# Patient Record
Sex: Male | Born: 1950 | Race: White | Hispanic: No | Marital: Single | Smoking: Never smoker
Health system: Southern US, Community
[De-identification: ages and names within clinical notes are randomized; demographics above are authoritative.]

## PROBLEM LIST (undated history)

## (undated) DIAGNOSIS — C679 Malignant neoplasm of bladder, unspecified: Secondary | ICD-10-CM

## (undated) DIAGNOSIS — F32A Depression, unspecified: Secondary | ICD-10-CM

## (undated) DIAGNOSIS — E7800 Pure hypercholesterolemia, unspecified: Secondary | ICD-10-CM

## (undated) DIAGNOSIS — M1990 Unspecified osteoarthritis, unspecified site: Secondary | ICD-10-CM

## (undated) DIAGNOSIS — C7910 Secondary malignant neoplasm of unspecified urinary organs: Principal | ICD-10-CM

## (undated) DIAGNOSIS — F329 Major depressive disorder, single episode, unspecified: Secondary | ICD-10-CM

## (undated) HISTORY — PX: TONSILLECTOMY: SUR1361

## (undated) HISTORY — DX: Secondary malignant neoplasm of unspecified urinary organs: C79.10

## (undated) HISTORY — DX: Malignant neoplasm of bladder, unspecified: C67.9

## (undated) HISTORY — PX: KNEE SURGERY: SHX244

---

## 2003-11-24 ENCOUNTER — Emergency Department (HOSPITAL_COMMUNITY): Admission: EM | Admit: 2003-11-24 | Discharge: 2003-11-24 | Payer: Self-pay | Admitting: Emergency Medicine

## 2006-04-18 ENCOUNTER — Ambulatory Visit (HOSPITAL_COMMUNITY): Admission: RE | Admit: 2006-04-18 | Discharge: 2006-04-18 | Payer: Self-pay | Admitting: Orthopaedic Surgery

## 2006-04-18 ENCOUNTER — Emergency Department (HOSPITAL_COMMUNITY): Admission: EM | Admit: 2006-04-18 | Discharge: 2006-04-18 | Payer: Self-pay | Admitting: Emergency Medicine

## 2006-04-21 ENCOUNTER — Ambulatory Visit (HOSPITAL_COMMUNITY): Admission: RE | Admit: 2006-04-21 | Discharge: 2006-04-21 | Payer: Self-pay | Admitting: Orthopaedic Surgery

## 2012-04-14 ENCOUNTER — Other Ambulatory Visit (HOSPITAL_COMMUNITY): Payer: Self-pay | Admitting: *Deleted

## 2012-04-14 ENCOUNTER — Other Ambulatory Visit (HOSPITAL_COMMUNITY): Payer: Self-pay | Admitting: General Surgery

## 2012-04-14 ENCOUNTER — Ambulatory Visit (HOSPITAL_COMMUNITY)
Admission: RE | Admit: 2012-04-14 | Discharge: 2012-04-14 | Disposition: A | Discharge status: Home / Self Care | Payer: Disability Insurance | Source: Ambulatory Visit | Attending: General Surgery | Admitting: General Surgery

## 2012-04-14 DIAGNOSIS — IMO0002 Reserved for concepts with insufficient information to code with codable children: Secondary | ICD-10-CM | POA: Insufficient documentation

## 2012-04-14 DIAGNOSIS — M25569 Pain in unspecified knee: Secondary | ICD-10-CM

## 2012-04-14 DIAGNOSIS — M25469 Effusion, unspecified knee: Secondary | ICD-10-CM | POA: Insufficient documentation

## 2012-04-14 DIAGNOSIS — M1710 Unilateral primary osteoarthritis, unspecified knee: Secondary | ICD-10-CM | POA: Insufficient documentation

## 2014-03-07 ENCOUNTER — Emergency Department (HOSPITAL_COMMUNITY)
Admission: EM | Admit: 2014-03-07 | Discharge: 2014-03-07 | Disposition: A | Discharge status: Home / Self Care | Payer: Disability Insurance | Attending: Emergency Medicine | Admitting: Emergency Medicine

## 2014-03-07 ENCOUNTER — Encounter (HOSPITAL_COMMUNITY): Payer: Self-pay | Admitting: Emergency Medicine

## 2014-03-07 DIAGNOSIS — Z8639 Personal history of other endocrine, nutritional and metabolic disease: Secondary | ICD-10-CM | POA: Insufficient documentation

## 2014-03-07 DIAGNOSIS — S61209A Unspecified open wound of unspecified finger without damage to nail, initial encounter: Secondary | ICD-10-CM | POA: Insufficient documentation

## 2014-03-07 DIAGNOSIS — Y9289 Other specified places as the place of occurrence of the external cause: Secondary | ICD-10-CM | POA: Insufficient documentation

## 2014-03-07 DIAGNOSIS — Z23 Encounter for immunization: Secondary | ICD-10-CM | POA: Insufficient documentation

## 2014-03-07 DIAGNOSIS — Y9389 Activity, other specified: Secondary | ICD-10-CM | POA: Insufficient documentation

## 2014-03-07 DIAGNOSIS — Z862 Personal history of diseases of the blood and blood-forming organs and certain disorders involving the immune mechanism: Secondary | ICD-10-CM | POA: Insufficient documentation

## 2014-03-07 DIAGNOSIS — W268XXA Contact with other sharp object(s), not elsewhere classified, initial encounter: Secondary | ICD-10-CM | POA: Insufficient documentation

## 2014-03-07 DIAGNOSIS — S61213A Laceration without foreign body of left middle finger without damage to nail, initial encounter: Secondary | ICD-10-CM

## 2014-03-07 HISTORY — DX: Pure hypercholesterolemia, unspecified: E78.00

## 2014-03-07 MED ORDER — TETANUS-DIPHTH-ACELL PERTUSSIS 5-2.5-18.5 LF-MCG/0.5 IM SUSP
0.5000 mL | Freq: Once | INTRAMUSCULAR | Status: AC
  Administered 2014-03-07: 0.5 mL via INTRAMUSCULAR
  Filled 2014-03-07: qty 0.5

## 2014-03-07 NOTE — ED Notes (Signed)
Patient verbalizes understanding of discharge instructions, home care and follow up care. Patient ambulatory out of department at this time. 

## 2014-03-07 NOTE — ED Provider Notes (Signed)
CSN: [id number]     Arrival date & time 03/07/14  0253 History   First MD Initiated Contact with Patient 03/07/14 0304     Chief Complaint  Patient presents with  . Laceration      HPI Pt was seen at 0305. Per pt, c/o sudden onset and resolution of one episode of "I cut my finger" that occurred approximately 2 hours PTA. Pt states he was trying to get superglue off a table with a razor blade when "the razor blade slipped" and he cut his left middle fingertip. Pt states he held pressure to the wound "but it kept bleeding." Denies any other injuries. No focal motor weakness, no tingling/numbness in extremities.   Unk last Td  Past Medical History  Diagnosis Date  . High cholesterol    Past Surgical History  Procedure Laterality Date  . Knee surgery    . Tonsillectomy      History  Substance Use Topics  . Smoking status: Never Smoker   . Smokeless tobacco: Not on file  . Alcohol Use: No    Review of Systems ROS: Statement: All systems negative except as marked or noted in the HPI; Constitutional: Negative for fever and chills. ; ; Eyes: Negative for eye pain, redness and discharge. ; ; ENMT: Negative for ear pain, hoarseness, nasal congestion, sinus pressure and sore throat. ; ; Cardiovascular: Negative for chest pain, palpitations, diaphoresis, dyspnea and peripheral edema. ; ; Respiratory: Negative for cough, wheezing and stridor. ; ; Gastrointestinal: Negative for nausea, vomiting, diarrhea, abdominal pain, blood in stool, hematemesis, jaundice and rectal bleeding. . ; ; Genitourinary: Negative for dysuria, flank pain and hematuria. ; ; Musculoskeletal: Negative for back pain and neck pain. Negative for swelling and trauma.; ; Skin: +laceration. Negative for pruritus, rash, abrasions, blisters, bruising and skin lesion.; ; Neuro: Negative for headache, lightheadedness and neck stiffness. Negative for weakness, altered level of consciousness , altered mental status, extremity weakness,  paresthesias, involuntary movement, seizure and syncope.     Allergies  Review of patient's allergies indicates no known allergies.  Home Medications   Prior to Admission medications   Not on File   BP 177/107  Pulse 67  Temp(Src) 97.5 F (36.4 C) (Oral)  Resp 20  Ht 5\' 11"  (1.803 m)  Wt 180 lb (81.647 kg)  BMI 25.12 kg/m2  SpO2 99% Physical Exam 0310: Physical examination:  Nursing notes reviewed; Vital signs and O2 SAT reviewed;  Constitutional: Well developed, Well nourished, Well hydrated, In no acute distress; Head:  Normocephalic, atraumatic; Eyes: EOMI, PERRL, No scleral icterus; ENMT: Mouth and pharynx normal, Mucous membranes moist; Neck: Supple, Full range of motion, No lymphadenopathy; Cardiovascular: Regular rate and rhythm, No gallop; Respiratory: Breath sounds clear & equal bilaterally, No rwheezes.  Speaking full sentences with ease, Normal respiratory effort/excursion; Chest: Nontender, Movement normal;; Extremities: Pulses normal, +1cm horizontal very superficial flap lac to tip of left middle finger. No nail or nailbed involvement. No tenderness, No edema, No calf edema or asymmetry.; Neuro: AA&Ox3, Major CN grossly intact.  Speech clear. No gross focal motor or sensory deficits in extremities. Climbs on and off stretcher easily by himself. Gait steady.; Skin: Color normal, Warm, Dry.   ED Course  Procedures   LACERATION REPAIR Performed by: Alfonzo Feller Authorized by: Alfonzo Feller Consent: Verbal consent obtained. Risks and benefits: risks, benefits and alternatives were discussed Consent given by: patient Patient identity confirmed: provided demographic data Prepped and Draped in normal sterile fashion Wound explored  Laceration Location: tip of left middle finger Laceration Length: 1cm No Foreign Bodies seen or palpated Anesthesia: none Irrigation method: water/soap Amount of cleaning: standard Skin closure: dermabond Patient tolerance:  Patient tolerated the procedure well with no immediate complications.   MDM  MDM Reviewed: previous chart, nursing note and vitals    0330:  Pt will very superficial small flap lac to tip of left middle finger. Dermabond applied with resultant hemostasis. Td updated. Dx d/w pt.   Questions answered.  Verb understanding, agreeable to d/c home with outpt f/u.    Francine Graven, DO 03/09/14 1736

## 2014-03-07 NOTE — Discharge Instructions (Signed)
°Emergency Department Resource Guide °1) Find a Doctor and Pay Out of Pocket °Although you won't have to find out who is covered by your insurance plan, it is a good idea to ask around and get recommendations. You will then need to call the office and see if the doctor you have chosen will accept you as a new patient and what types of options they offer for patients who are self-pay. Some doctors offer discounts or will set up payment plans for their patients who do not have insurance, but you will need to ask so you aren't surprised when you get to your appointment. ° °2) Contact Your Local Health Department °Not all health departments have doctors that can see patients for sick visits, but many do, so it is worth a call to see if yours does. If you don't know where your local health department is, you can check in your phone book. The CDC also has a tool to help you locate your state's health department, and many state websites also have listings of all of their local health departments. ° °3) Find a Walk-in Clinic °If your illness is not likely to be very severe or complicated, you may want to try a walk in clinic. These are popping up all over the country in pharmacies, drugstores, and shopping centers. They're usually staffed by nurse practitioners or physician assistants that have been trained to treat common illnesses and complaints. They're usually fairly quick and inexpensive. However, if you have serious medical issues or chronic medical problems, these are probably not your best option. ° °No Primary Care Doctor: °- Call [facility] at  832-8000 - they can help you locate a primary care doctor that  accepts your insurance, provides certain services, etc. °- Physician Referral Service- 1-800-533-3463 ° °Chronic Pain Problems: °Organization         Address  Phone   Notes  °[facility]  (336) 297-2271 Patients need to be referred by their primary care doctor.  ° °Medication  Assistance: °Organization         Address  Phone   Notes  °[place] [facility] [address] °[place], [place] [address] (336) 641-8030 --Must be a resident of [place] °-- Must have NO insurance coverage whatsoever (no Medicaid/ Medicare, etc.) °-- The pt. MUST have a primary care doctor that directs their care regularly and follows them in the community °  °[facility]  (866) 331-1348   °[employer]  (888) 892-1162   ° °Agencies that provide inexpensive medical care: °Organization         Address  Phone   Notes  °[facility]  (336) 832-8035   °[facility]    (336) 832-7272   °[facility] [address] °[place],  [address] (336) 832-4777   °[facility] [address], °[place] (336) 271-4999   °[facility]    (336) 373-0678   °[facility]    (336) 272-1050   °[facility] ° [address], [place] Phone:  (336) 832-4444, Fax:  (336) 832-4440 Hours of Operation:  9 am - 6 pm, M-F.  Also accepts Medicaid/Medicare and self-pay.  °[facility] ° [address],  Phone: (336) 832-3150, Fax: (336) [phone]. Hours of Operation:  8:30 am - 5:30 pm, M-F.  Also accepts Medicaid and self-pay.  °[facility] [place] [address], [place] Phone: (336) 878-6027   °[facility] [address], [place], [place] (336)723-1848, Ext. 123 Mondays & Thursdays: 7-9 AM.  First 15 patients are seen on a first come, first serve basis. °  ° °Medicaid-accepting [place] Providers: ° °Organization         Address  Phone   Notes  °[facility] [address], [place] (336) 641-2100 Also accepts self-pay patients.  °[facility] [address], [place] ° (336) 856-9996   °[facility] [address], [place]  (336) 288-8857   °[facility] [address] [place] [address], [place] (336) 299-7000   °Veita Bland [address], [place]  ° (336) 373-1557 Only accepts [place] Access Medicaid patients after they have their name applied to their card.  ° °Self-Pay (no insurance) in [place]: ° °Organization         Address  Phone   Notes  °Sickle Cell Patients, [facility] [address], [place] (336) 832-1970   °[facility] [address], [place] (336) 832-4400   °[facility] ° [address], [place] (336) 992-4800   °[facility]/Dr. Osei-Bonsu ° [address] [place] [address], [place] or [address], [place] (336) 841-8500 Phone number for both [place] and [place] locations is the same.  °[facility] [address], [place] (336) 299-0000   °[facility] [address] [place] [address], [place] or [address] (336) 852-7530 °(336) 878-2260   °[facility] [address], [place] (336) 350-1642, phone; (336) 294-5005, fax Sees patients 1st and 3rd Saturday of every month.  Must not qualify for public or private insurance (i.e. Medicaid, Medicare, [place] Health Choice, Veterans' Benefits) • Household income should be no more than 200% of the poverty level •The clinic cannot treat you if you are pregnant or think you are pregnant • Sexually transmitted diseases are not treated at the clinic.  ° ° °Dental Care: °Organization         Address  Phone  Notes  °[place] [facility] [address], [place] (336) 641-6152 Accepts children up to age 21 who are enrolled in Medicaid or [place] Health Choice; pregnant women with a Medicaid card; and children who have applied for Medicaid or [place] Health Choice, but were declined, whose parents can pay a reduced fee at time of service.  °[place]  [facility] [place]  [address], [place] (336) 641-7733 Accepts children up to age 21 who are enrolled in Medicaid or [place] Health Choice; pregnant women with a Medicaid card; and children who have applied for Medicaid or [place] Health Choice, but were declined, whose parents can pay a reduced fee at time of service.  °[facility] ° [address],  (336) 641-4533 Patients are seen by appointment only. Walk-ins are not accepted. [facility] will see patients 18 years of age and older. °Monday - Tuesday (8am-5pm) °Most Wednesdays (8:30-5pm) °$30 per visit, cash only  °[facility] ° [address], [place] (336) 641-4533 Patients are seen by appointment only. Walk-ins are not accepted. [facility] will see patients 18 years of age and older. °One   Wednesday Evening (Monthly: Volunteer Based).  $30 per visit, cash only  °[facility]  (919) [phone] for adults; Children under age 4, call Graduate Pediatric Dentistry at ([phone]) [phone]. Children aged 4-14, please call ([phone]) [phone] to request a pediatric application. ° Dental services are provided in all areas of dental care including fillings, crowns and bridges, complete and partial dentures, implants, gum treatment, root canals, and extractions. Preventive care is also provided. Treatment is provided to both adults and children. °Patients are selected via a lottery and there is often a waiting list. °  °[facility] [address], °[place] ° (336) 763-8833 [email or web address] °  °[facility] [address], [place], [place] (336)723-1848, Ext. 123 Second and Fourth Thursday of each month, opens at 6:30 AM; Clinic ends at 9 AM.  Patients are seen on a first-come first-served basis, and a limited number are seen during each clinic.  ° °[facility] ° [address], [place], [place] (336) [phone]    Eligibility Requirements °You must have lived in [place], [place], or [place] counties for at least the last three months. °  You cannot be eligible for state or federal sponsored healthcare insurance, including Veterans Administration, Medicaid, or Medicare. °  You generally cannot be eligible for healthcare insurance through your employer.  °  How to apply: °Eligibility screenings are held every Tuesday and Wednesday afternoon from 1:00 pm until 4:00 pm. You do not need an appointment for the interview!  °[facility] [address], [place], [place] 336-631-2330   °[facility]  336-342-8273   °[place] [facility]  336-703-3100   °[facility]  336-570-6415   ° °Behavioral Health Resources in the Community: °Intensive Outpatient Programs °Organization         Address  Phone  Notes  °[facility] [address], [place], [place] 336-878-6098   °[facility] [address], [place], [place] 336-832-9800   °[facility] [address], [place], [place] ° 336-882-2125   °[facility] [address],  °[place], [place] 1-800-853-5163 or 336-641-4981   °Substance Abuse Resources °Organization         Address  Phone  Notes  °[facility]  336-882-2125   °[facility]  336-784-9470   °[facility]  336-285-9073   °[facility]  336-845-3988   °Residential & Outpatient Substance Abuse Program  1-800-659-3381   °Psychological Services °Organization         Address  Phone  Notes  °[facility]  336- 832-9600   °[facility]  336- 378-7881   °[facility] [address], [place] 1-800-853-5163 or 336-641-4981   ° °Mobile Crisis Teams °Organization         Address  Phone  Notes  °[facility], Mobile Crisis Care Unit  1-877-626-1772   °Assertive °[facility] ° [address]  [place], [place] 336-834-9664   °Sharon DeEsch [address] ° [place] 336-554-5454   ° °Self-Help/Support Groups °Organization         Address  Phone             Notes  °[facility]  - variety of support groups  336- 373-1402 Call for more information  °Narcotics Anonymous (NA), [facility] [address], °[place]  2 meetings at this location  ° °  Residential Treatment Programs Organization         Address  Phone  Notes  [facility] [address]    [place]  1-807-030-4307   [facility]  [address] [place] [address], [place], [place]   [place], [place] [phone] Admissions: 8am-3pm M-F  [facility] [place] [address]    [place], [place] 852-778-2423   [facility] [address] [place], [place], [place]   [facility] [address],  [place], [place]   [facility] Intensive Outpatient [address], [place], [place]   [facility] ([place] [place]  [place], [place] 1-(463)155-8629 or (484)[phone]   [facility] [address], [place], [place] Accepts Medicaid  [facility] [address]  [place] 1-630-681-7272 Substance Abuse/Addiction Treatment   [facility] Organization         Address  Phone  Notes  [facility]  331-457-7219   Domenic Schwab, PhD [address] [place], [place]   (843)291-9052 or [phone]   [facility] [place], [place] [phone]   [facility] [address], [place], [place] [phone] Insurance/Medicaid/sponsorship through [facility] Families [address], [address] [place]                                    [place], [place] [phone] [facility] [address], [place] [phone]    Dr. Adele Schilder  [phone]   [facility] Dept. 1) [address] [place] 2) [place] 3)  [address], [place] [phone]  [phone]   [place] [phone] or [phone] (After Hours)       Take your usual prescriptions as previously directed.  Wash the glued area gently with soap and water at least twice a day, and cover with a clean/dry dressing.  Change the dressing whenever it becomes wet or soiled after washing the area with soap and water.  Call your regular medical doctor today to schedule a follow up appointment for a recheck within the next 2 days to re-check your wound.  Return to the Emergency Department immediately if worsening.

## 2014-03-07 NOTE — ED Notes (Signed)
Patient states he cut his finger with a razor blade while trying to remove super glue from a table. Active bleeding noted to distal portion of left index finger.

## 2014-03-07 NOTE — ED Notes (Signed)
Patient states he was trying to get super glue off his finger with a razor and it slipped, cutting his left middle finger about 2 hours ago. States "it will not stop bleeding." Patient bleeding at triage.

## 2014-03-07 NOTE — ED Notes (Signed)
Band aid applied to left index finger over derma bond

## 2014-07-09 ENCOUNTER — Ambulatory Visit (HOSPITAL_COMMUNITY)
Admission: RE | Admit: 2014-07-09 | Discharge: 2014-07-09 | Disposition: A | Discharge status: Home / Self Care | Payer: Commercial Managed Care - HMO | Source: Ambulatory Visit | Attending: Internal Medicine | Admitting: Internal Medicine

## 2014-07-09 ENCOUNTER — Other Ambulatory Visit (HOSPITAL_COMMUNITY): Payer: Self-pay | Admitting: Internal Medicine

## 2014-07-09 DIAGNOSIS — R310 Gross hematuria: Secondary | ICD-10-CM | POA: Diagnosis present

## 2014-07-09 LAB — POCT I-STAT CREATININE: CREATININE: 1.3 mg/dL (ref 0.50–1.35)

## 2014-07-09 MED ORDER — IOHEXOL 300 MG/ML  SOLN: 125.0000 mL | Freq: Once | INTRAMUSCULAR | Status: AC | PRN

## 2014-07-09 MED ORDER — SODIUM CHLORIDE 0.9 % IV SOLN
INTRAVENOUS | Status: AC
  Filled 2014-07-09: qty 250

## 2014-07-11 ENCOUNTER — Other Ambulatory Visit: Payer: Self-pay | Admitting: Urology

## 2014-07-12 ENCOUNTER — Encounter (HOSPITAL_COMMUNITY): Payer: Self-pay | Admitting: *Deleted

## 2014-07-15 NOTE — Progress Notes (Addendum)
Called Dr. Ralene Muskrat office requested orders be released to Epic sign and held  Same  Day surgery tomorrow 07-16-14 Thanks

## 2014-07-15 NOTE — H&P (Signed)
Active Problems Problems  1. Gross hematuria (R31.0) 2. Lymphadenopathy (R59.1) 3. Mass of urinary bladder (N32.89) 4. 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 Wende Neighbors for gross hematuria and probable metastatic bladder cancer. He had the onset 2 weeks ago of bloody urine without clots. He is no longer seeing the blood as much. He has increased frequency for the last week. He has nocturia x 1. He has no hesitancy but does report a reduced stream. He has no prior GU history. He had a CT on 1/19 that showed an irregular bladder wall with calcification suspicious for cancer and he has bulky pelvic adenopathy. there is a left peripelvic cyst but nothing to suggest upper tract disease. His Cr is 1.33 and his PSA is 2.10. He has a Hgb of 16.   Past Medical History Problems  1. History of arthritis (Z87.39) 2. History of hyperlipidemia (Z86.39) 3. History of hypertension (Z86.79)  Surgical History Problems  1. History of Knee Surgery Left  Current Meds 1. No Reported Medications Recorded  Allergies Medication  1. No Known Drug Allergies  Family History Problems  1. Family history of malignant neoplasm (Z80.9) : Mother  Social History Problems    Caffeine use (F15.90)   Death in the family, father   Death in the family, mother   Divorced   Never a smoker   No alcohol use   Number of children   Occupation  He has had no environmental exposures.   Review of Systems Genitourinary, constitutional, skin, eye, otolaryngeal, hematologic/lymphatic, cardiovascular, pulmonary, endocrine, musculoskeletal, gastrointestinal, neurological and psychiatric system(s) were reviewed and pertinent findings if present are noted and are otherwise negative.  Genitourinary: urinary frequency, dysuria, nocturia, incontinence and hematuria.  Gastrointestinal: no abdominal pain.  Constitutional: no recent weight loss.    Vitals Vital  Signs [Data Includes: Last 1 Day]  Recorded: 21Jan2016 12:37PM  Height: 5 ft 11 in Weight: 160 lb  BMI Calculated: 22.32 BSA Calculated: 1.92 Blood Pressure: 145 / 90 Temperature: 97.2 F Heart Rate: 61  Physical Exam Constitutional: Well nourished and well developed . No acute distress.  ENT:. The ears and nose are normal in appearance.  Neck: The appearance of the neck is normal and no neck mass is present.  Pulmonary: No respiratory distress and normal respiratory rhythm and effort.  Cardiovascular: Heart rate and rhythm are normal . No peripheral edema.  Abdomen: The abdomen is soft and nontender. No masses are palpated. No CVA tenderness.  A right inguinal hernia is present, which is reducible.  A left inguinal hernia is present, which is reducible. No hepatosplenomegaly noted.  Rectal: Rectal exam demonstrates normal sphincter tone, no tenderness and no masses. Estimated prostate size is 2+. The prostate has a palpable nodule (1cm) involving the right of the prostate and is not tender. The left seminal vesicle is nonpalpable. The right seminal vesicle is nonpalpable. The perineum is normal on inspection.  Genitourinary: Examination of the penis demonstrates no discharge, no masses, no lesions and a normal meatus. The scrotum is without lesions. The right epididymis is palpably normal and non-tender. The left epididymis is palpably normal and non-tender. The right testis is atrophic, but non-tender and without masses. The left testis is atrophic, but non-tender and without masses.  Lymphatics: The posterior cervical, supraclavicular, axillary, femoral and inguinal nodes are not enlarged or tender.  Skin: Normal skin turgor, no visible rash and no visible skin lesions.  Neuro/Psych:.  Mood and affect are appropriate. Normal sensation of the perineum/perianal region (S3,4,5).    Results/Data Urine [Data Includes: Last 1 Day]   [id number]  COLOR STRAW   APPEARANCE CLEAR   SPECIFIC  GRAVITY 1.010   pH 7.0   GLUCOSE NEG mg/dL  BILIRUBIN NEG   KETONE NEG mg/dL  BLOOD LARGE   PROTEIN 30 mg/dL  UROBILINOGEN 0.2 mg/dL  NITRITE NEG   LEUKOCYTE ESTERASE NEG   SQUAMOUS EPITHELIAL/HPF RARE   WBC 0-2 WBC/hpf  RBC TNTC RBC/hpf  BACTERIA NONE SEEN   CRYSTALS NONE SEEN   CASTS NONE SEEN   Other OVAL FAT BODIES NOTED    The following images/tracing/specimen were independently visualized:  CT films and report reviewed.  The following clinical lab reports were reviewed:  UA and labs from Dr. Nevada Crane reviewed.    Assessment Assessed  1. 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 Mass of urinary bladder (N32.89) 3. Lymphadenopathy (R59.1) 4. Gross hematuria (R31.0) 5. 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  He has a possible liver met as well.  There is a left peripelvic cyst.  He has bilateral testicular atrophy.   Plan Gross hematuria  1. URINE CYTOLOGY; Status:Hold For - Specimen/Data Collection,Appointment;  Requested [id number];  Health Maintenance  2. UA With REFLEX; [Do Not Release]; Status:Resulted - Requires Verification;   Done:  [id number] 12:31PM Mass of urinary bladder, Nodular prostate without lower urinary tract symptoms  3. Follow-up Schedule Surgery Office  Follow-up  Status: Hold For - Appointment   Requested for: 21Jan2016  I am going to get him set up for a cystoscopy with TURBT and prostate [place] with biopsy.  I have reviewed the risks of bleeding, infection, injury to adjacent structures, thrombotic events and anesthetic complications.  He will probably need an MRI for the liver lesion at some point, but I think it is best we get a tissue diagnosis first.   Hopefully we will be able to get him on the schedule next  week.  I am going to check a testosterone level because of the testicular atrophy.   Urine cytology today.

## 2014-07-16 ENCOUNTER — Encounter (HOSPITAL_COMMUNITY): Payer: Self-pay | Admitting: *Deleted

## 2014-07-16 ENCOUNTER — Observation Stay (HOSPITAL_COMMUNITY)
Admission: RE | Admit: 2014-07-16 | Discharge: 2014-07-19 | Disposition: A | Discharge status: Home / Self Care | Payer: Commercial Managed Care - HMO | Source: Ambulatory Visit | Attending: Urology | Admitting: Urology

## 2014-07-16 ENCOUNTER — Ambulatory Visit (HOSPITAL_COMMUNITY): Payer: Commercial Managed Care - HMO | Admitting: Anesthesiology

## 2014-07-16 ENCOUNTER — Encounter (HOSPITAL_COMMUNITY)
Admission: RE | Disposition: A | Discharge status: Home / Self Care | Payer: Self-pay | Source: Ambulatory Visit | Attending: Urology

## 2014-07-16 DIAGNOSIS — N4289 Other specified disorders of prostate: Secondary | ICD-10-CM | POA: Insufficient documentation

## 2014-07-16 DIAGNOSIS — C7910 Secondary malignant neoplasm of unspecified urinary organs: Secondary | ICD-10-CM

## 2014-07-16 DIAGNOSIS — I10 Essential (primary) hypertension: Secondary | ICD-10-CM | POA: Insufficient documentation

## 2014-07-16 DIAGNOSIS — M1990 Unspecified osteoarthritis, unspecified site: Secondary | ICD-10-CM | POA: Diagnosis not present

## 2014-07-16 DIAGNOSIS — N329 Bladder disorder, unspecified: Secondary | ICD-10-CM | POA: Diagnosis present

## 2014-07-16 DIAGNOSIS — C61 Malignant neoplasm of prostate: Principal | ICD-10-CM | POA: Insufficient documentation

## 2014-07-16 DIAGNOSIS — N289 Disorder of kidney and ureter, unspecified: Secondary | ICD-10-CM

## 2014-07-16 DIAGNOSIS — N179 Acute kidney failure, unspecified: Secondary | ICD-10-CM | POA: Insufficient documentation

## 2014-07-16 DIAGNOSIS — N500 Atrophy of testis: Secondary | ICD-10-CM | POA: Insufficient documentation

## 2014-07-16 DIAGNOSIS — K4020 Bilateral inguinal hernia, without obstruction or gangrene, not specified as recurrent: Secondary | ICD-10-CM | POA: Insufficient documentation

## 2014-07-16 DIAGNOSIS — E785 Hyperlipidemia, unspecified: Secondary | ICD-10-CM | POA: Insufficient documentation

## 2014-07-16 DIAGNOSIS — C675 Malignant neoplasm of bladder neck: Secondary | ICD-10-CM | POA: Diagnosis not present

## 2014-07-16 DIAGNOSIS — R310 Gross hematuria: Secondary | ICD-10-CM | POA: Diagnosis not present

## 2014-07-16 HISTORY — DX: Unspecified osteoarthritis, unspecified site: M19.90

## 2014-07-16 HISTORY — DX: Secondary malignant neoplasm of unspecified urinary organs: C79.10

## 2014-07-16 HISTORY — PX: TRANSURETHRAL RESECTION OF BLADDER TUMOR WITH GYRUS (TURBT-GYRUS): SHX6458

## 2014-07-16 HISTORY — DX: Major depressive disorder, single episode, unspecified: F32.9

## 2014-07-16 HISTORY — DX: Depression, unspecified: F32.A

## 2014-07-16 HISTORY — PX: CYSTOSCOPY WITH BIOPSY: SHX5122

## 2014-07-16 LAB — GLUCOSE, CAPILLARY: GLUCOSE-CAPILLARY: 111 mg/dL — AB (ref 70–99)

## 2014-07-16 LAB — TESTOSTERONE: TESTOSTERONE: 267 ng/dL — AB (ref 300–890)

## 2014-07-16 SURGERY — TRANSURETHRAL RESECTION OF BLADDER TUMOR WITH GYRUS (TURBT-GYRUS)
Anesthesia: General

## 2014-07-16 MED ORDER — HYDROMORPHONE HCL 1 MG/ML IJ SOLN
INTRAMUSCULAR | Status: AC
  Filled 2014-07-16: qty 1

## 2014-07-16 MED ORDER — ONDANSETRON HCL 4 MG/2ML IJ SOLN
INTRAMUSCULAR | Status: AC
  Filled 2014-07-16: qty 2

## 2014-07-16 MED ORDER — ACETAMINOPHEN 160 MG/5ML PO SOLN
325.0000 mg | ORAL | Status: DC | PRN
  Filled 2014-07-16: qty 20.3

## 2014-07-16 MED ORDER — OXYBUTYNIN CHLORIDE 5 MG PO TABS: 5.0000 mg | ORAL_TABLET | Freq: Three times a day (TID) | ORAL | Status: DC | PRN

## 2014-07-16 MED ORDER — DIPHENHYDRAMINE HCL 12.5 MG/5ML PO ELIX: 12.5000 mg | ORAL_SOLUTION | Freq: Four times a day (QID) | ORAL | Status: DC | PRN

## 2014-07-16 MED ORDER — HYDROMORPHONE HCL 1 MG/ML IJ SOLN
0.2500 mg | INTRAMUSCULAR | Status: DC | PRN
  Administered 2014-07-16 (×2): 0.5 mg via INTRAVENOUS

## 2014-07-16 MED ORDER — SODIUM CHLORIDE 0.9 % IR SOLN
Status: DC | PRN
  Administered 2014-07-16: 15000 mL

## 2014-07-16 MED ORDER — FENTANYL CITRATE 0.05 MG/ML IJ SOLN
INTRAMUSCULAR | Status: AC
  Filled 2014-07-16: qty 2

## 2014-07-16 MED ORDER — OXYCODONE HCL 5 MG PO TABS: 5.0000 mg | ORAL_TABLET | Freq: Once | ORAL | Status: DC | PRN

## 2014-07-16 MED ORDER — PROPOFOL INFUSION 10 MG/ML OPTIME
INTRAVENOUS | Status: DC | PRN
  Administered 2014-07-16: 25 ug/kg/min via INTRAVENOUS

## 2014-07-16 MED ORDER — CEFTRIAXONE SODIUM IN DEXTROSE 20 MG/ML IV SOLN
1.0000 g | INTRAVENOUS | Status: DC
  Administered 2014-07-17 – 2014-07-18 (×2): 1 g via INTRAVENOUS
  Filled 2014-07-16 (×3): qty 50

## 2014-07-16 MED ORDER — KCL IN DEXTROSE-NACL 20-5-0.45 MEQ/L-%-% IV SOLN
INTRAVENOUS | Status: DC
  Administered 2014-07-16 – 2014-07-18 (×3): via INTRAVENOUS
  Filled 2014-07-16 (×8): qty 1000

## 2014-07-16 MED ORDER — HYDROMORPHONE HCL 1 MG/ML IJ SOLN: 0.5000 mg | INTRAMUSCULAR | Status: DC | PRN

## 2014-07-16 MED ORDER — ACETAMINOPHEN 325 MG PO TABS: 650.0000 mg | ORAL_TABLET | ORAL | Status: DC | PRN

## 2014-07-16 MED ORDER — MIDAZOLAM HCL 2 MG/2ML IJ SOLN
INTRAMUSCULAR | Status: AC
  Filled 2014-07-16: qty 2

## 2014-07-16 MED ORDER — HYDROCODONE-ACETAMINOPHEN 5-325 MG PO TABS: 1.0000 | ORAL_TABLET | ORAL | Status: DC | PRN

## 2014-07-16 MED ORDER — DIPHENHYDRAMINE HCL 50 MG/ML IJ SOLN: 12.5000 mg | Freq: Four times a day (QID) | INTRAMUSCULAR | Status: DC | PRN

## 2014-07-16 MED ORDER — LACTATED RINGERS IV SOLN
INTRAVENOUS | Status: DC
  Administered 2014-07-16: 15:00:00 via INTRAVENOUS
  Administered 2014-07-16: 1000 mL via INTRAVENOUS

## 2014-07-16 MED ORDER — ONDANSETRON HCL 4 MG/2ML IJ SOLN
4.0000 mg | INTRAMUSCULAR | Status: DC | PRN
  Administered 2014-07-17: 4 mg via INTRAVENOUS
  Filled 2014-07-16: qty 2

## 2014-07-16 MED ORDER — CIPROFLOXACIN IN D5W 400 MG/200ML IV SOLN
400.0000 mg | INTRAVENOUS | Status: AC
  Administered 2014-07-16: 400 mg via INTRAVENOUS

## 2014-07-16 MED ORDER — 0.9 % SODIUM CHLORIDE (POUR BTL) OPTIME
TOPICAL | Status: DC | PRN
  Administered 2014-07-16: 1000 mL

## 2014-07-16 MED ORDER — PROPOFOL 10 MG/ML IV BOLUS
INTRAVENOUS | Status: DC | PRN
Start: 2014-07-16 — End: 2014-07-16
  Administered 2014-07-16 (×2): 30 mg via INTRAVENOUS
  Administered 2014-07-16 (×2): 20 mg via INTRAVENOUS
  Administered 2014-07-16: 120 mg via INTRAVENOUS

## 2014-07-16 MED ORDER — OXYCODONE HCL 5 MG/5ML PO SOLN
5.0000 mg | Freq: Once | ORAL | Status: DC | PRN
  Filled 2014-07-16: qty 5

## 2014-07-16 MED ORDER — ACETAMINOPHEN 325 MG PO TABS: 325.0000 mg | ORAL_TABLET | ORAL | Status: DC | PRN

## 2014-07-16 MED ORDER — BISACODYL 10 MG RE SUPP: 10.0000 mg | Freq: Every day | RECTAL | Status: DC | PRN

## 2014-07-16 MED ORDER — STERILE WATER FOR IRRIGATION IR SOLN
Status: DC | PRN
  Administered 2014-07-16: 500 mL

## 2014-07-16 MED ORDER — PROPOFOL 10 MG/ML IV BOLUS
INTRAVENOUS | Status: AC
  Filled 2014-07-16: qty 20

## 2014-07-16 MED ORDER — ONDANSETRON HCL 4 MG/2ML IJ SOLN
INTRAMUSCULAR | Status: DC | PRN
  Administered 2014-07-16: 4 mg via INTRAVENOUS

## 2014-07-16 MED ORDER — MIDAZOLAM HCL 5 MG/5ML IJ SOLN
INTRAMUSCULAR | Status: DC | PRN
  Administered 2014-07-16: 2 mg via INTRAVENOUS

## 2014-07-16 MED ORDER — LIDOCAINE HCL (CARDIAC) 20 MG/ML IV SOLN
INTRAVENOUS | Status: DC | PRN
  Administered 2014-07-16: 60 mg via INTRAVENOUS
  Administered 2014-07-16: 40 mg via INTRAVENOUS

## 2014-07-16 MED ORDER — DEXTROSE 5 % IV SOLN
2.0000 g | INTRAVENOUS | Status: AC
  Administered 2014-07-16: 2 g via INTRAVENOUS

## 2014-07-16 MED ORDER — CEFTRIAXONE SODIUM 2 G IJ SOLR
INTRAMUSCULAR | Status: AC
  Filled 2014-07-16: qty 2

## 2014-07-16 MED ORDER — ZOLPIDEM TARTRATE 5 MG PO TABS: 5.0000 mg | ORAL_TABLET | Freq: Every evening | ORAL | Status: DC | PRN

## 2014-07-16 MED ORDER — FENTANYL CITRATE 0.05 MG/ML IJ SOLN
INTRAMUSCULAR | Status: DC | PRN
  Administered 2014-07-16 (×5): 25 ug via INTRAVENOUS
  Administered 2014-07-16: 50 ug via INTRAVENOUS
  Administered 2014-07-16: 25 ug via INTRAVENOUS

## 2014-07-16 MED ORDER — DOCUSATE SODIUM 100 MG PO CAPS
100.0000 mg | ORAL_CAPSULE | Freq: Two times a day (BID) | ORAL | Status: DC
  Administered 2014-07-16 – 2014-07-18 (×5): 100 mg via ORAL
  Filled 2014-07-16 (×9): qty 1

## 2014-07-16 MED ORDER — CIPROFLOXACIN IN D5W 400 MG/200ML IV SOLN
INTRAVENOUS | Status: AC
  Filled 2014-07-16: qty 200

## 2014-07-16 SURGICAL SUPPLY — 20 items
BAG URINE DRAINAGE (UROLOGICAL SUPPLIES) ×2 IMPLANT
BAG URO CATCHER STRL LF (DRAPE) ×3 IMPLANT
CATH FOLEY 3WAY 30CC 22FR (CATHETERS) IMPLANT
CATH HEMA 3WAY 30CC 24FR COUDE (CATHETERS) ×2 IMPLANT
CATH URET 5FR 28IN OPEN ENDED (CATHETERS) IMPLANT
DRAPE CAMERA CLOSED 9X96 (DRAPES) ×1 IMPLANT
ELECT LOOP 22F BIPOLAR SML (ELECTROSURGICAL) ×3
ELECTRODE LOOP 22F BIPOLAR SML (ELECTROSURGICAL) IMPLANT
GLOVE SURG SS PI 8.0 STRL IVOR (GLOVE) IMPLANT
GOWN STRL REUS W/TWL XL LVL3 (GOWN DISPOSABLE) ×3 IMPLANT
HOLDER FOLEY CATH W/STRAP (MISCELLANEOUS) IMPLANT
KIT ASPIRATION TUBING (SET/KITS/TRAYS/PACK) ×1 IMPLANT
MANIFOLD NEPTUNE II (INSTRUMENTS) ×3 IMPLANT
PACK CYSTO (CUSTOM PROCEDURE TRAY) ×3 IMPLANT
PLUG CATH AND CAP STER (CATHETERS) ×2 IMPLANT
SUT ETHILON 3 0 PS 1 (SUTURE) IMPLANT
SYR 30ML LL (SYRINGE) ×2 IMPLANT
SYRINGE IRR TOOMEY STRL 70CC (SYRINGE) ×2 IMPLANT
TUBING CONNECTING 10 (TUBING) ×2 IMPLANT
TUBING CONNECTING 10' (TUBING) ×1

## 2014-07-16 NOTE — Brief Op Note (Signed)
07/16/2014  2:35 PM  PATIENT:  Jesus [name]  64 y.o. male  PRE-OPERATIVE DIAGNOSIS:  bladder mass and prostate nodule  POST-OPERATIVE DIAGNOSIS:  bladder mass and prostate nodule  PROCEDURE:  Procedure(s): TRANSURETHRAL RESECTION OF BLADDER TUMOR >5cm WITH GYRUS (TURBT-GYRUS) (N/A) CYSTOSCOPY WITH PROSTATE ULTRASOUND AND BIOPSY (N/A)  SURGEON:  Surgeon(s) and Role:    * Malka So, MD - Primary  PHYSICIAN ASSISTANT:   ASSISTANTS: none   ANESTHESIA:   general  EBL:     BLOOD ADMINISTERED:none  DRAINS: Urinary Catheter (Foley)   LOCAL MEDICATIONS USED:  NONE  SPECIMEN:  Source of Specimen:  12 core prostate biopsy and tumor chips from bladder and prostatic urethra  DISPOSITION OF SPECIMEN:  PATHOLOGY  COUNTS:  YES  TOURNIQUET:  * No tourniquets in log *  DICTATION: .Other Dictation: Dictation Number [id number]  PLAN OF CARE: Admit for overnight observation  PATIENT DISPOSITION:  PACU - hemodynamically stable.   Delay start of Pharmacological VTE agent (>24hrs) due to surgical blood loss or risk of bleeding: yes

## 2014-07-16 NOTE — Progress Notes (Signed)
PACU Nsg Note: follow from prev pacu nsg note: Foley catheter again noted to strop draining, blood clot could be visualized at beginning of foley catheter. Foley catheter re-irrigated again, this time w/ 60 ml's sterile water per previous MD orders. Large clot mobilized and large amt of bloody urine again began draining, pt stated immediately that he felt better, pt now noted to be closing eyes, resting quietly, no longer restless and have facial grimmacing. Will cont to monitor and observe.  Stormy Card, BSN, RN, RRT, CPAN, CCRN

## 2014-07-16 NOTE — Transfer of Care (Signed)
Immediate Anesthesia Transfer of Care Note  Patient: Jesus [name]  Procedure(s) Performed: Procedure(s) (LRB): TRANSURETHRAL RESECTION OF BLADDER TUMOR WITH GYRUS (TURBT-GYRUS) (N/A) CYSTOSCOPY WITH PROSTATE ULTRASOUND AND BIOPSY (N/A)  Patient Location: PACU  Anesthesia Type: General  Level of Consciousness: sedated, patient cooperative and responds to stimulation  Airway & Oxygen Therapy: Patient Spontanous Breathing and Patient connected to face mask oxgen  Post-op Assessment: Report given to PACU RN and Post -op Vital signs reviewed and stable  Post vital signs: Reviewed and stable  Complications: No apparent anesthesia complications

## 2014-07-16 NOTE — Progress Notes (Signed)
Patient resting in bed, son in room who plans to stay overnight.  Patient has no complaints of pain or discomfort.

## 2014-07-16 NOTE — Interval H&P Note (Signed)
History and Physical Interval Note:  07/16/2014 12:47 PM  Jesus [name]  has presented today for surgery, with the diagnosis of bladder mass and prostate nodule  The various methods of treatment have been discussed with the patient and family. After consideration of risks, benefits and other options for treatment, the patient has consented to  Procedure(s): TRANSURETHRAL RESECTION OF BLADDER TUMOR WITH GYRUS (TURBT-GYRUS) (N/A) CYSTOSCOPY WITH PROSTATE ULTRASOUND AND BIOPSY (N/A) as a surgical intervention .  The patient's history has been reviewed, patient examined, no change in status, stable for surgery.  I have reviewed the patient's chart and labs.  Questions were answered to the patient's satisfaction.     [name]

## 2014-07-16 NOTE — Anesthesia Preprocedure Evaluation (Signed)
Anesthesia Evaluation  Patient identified by MRN, date of birth, ID band Patient awake    Reviewed: Allergy & Precautions, NPO status , Patient's Chart, lab work & pertinent test results  History of Anesthesia Complications Negative for: history of anesthetic complications  Airway Mallampati: II  TM Distance: >3 FB Neck ROM: Full    Dental  (+) Teeth Intact   Pulmonary neg pulmonary ROS,  breath sounds clear to auscultation        Cardiovascular negative cardio ROS  Rhythm:Regular     Neuro/Psych negative neurological ROS  negative psych ROS   GI/Hepatic negative GI ROS, Neg liver ROS,   Endo/Other  negative endocrine ROS  Renal/GU negative Renal ROS     Musculoskeletal   Abdominal   Peds  Hematology negative hematology ROS (+)   Anesthesia Other Findings   Reproductive/Obstetrics                             Anesthesia Physical Anesthesia Plan  ASA: II  Anesthesia Plan: General   Post-op Pain Management:    Induction: Intravenous  Airway Management Planned: LMA  Additional Equipment: None  Intra-op Plan:   Post-operative Plan: Extubation in OR  Informed Consent: I have reviewed the patients History and Physical, chart, labs and discussed the procedure including the risks, benefits and alternatives for the proposed anesthesia with the patient or authorized representative who has indicated his/her understanding and acceptance.   Dental advisory given  Plan Discussed with: CRNA and Surgeon  Anesthesia Plan Comments:         Anesthesia Quick Evaluation

## 2014-07-16 NOTE — Progress Notes (Signed)
PACU Nsg Note: pt became restless, facial grimmacing noted, pt states having burning at penis and stated "I can't pee", foley catheter assessed, foley tubing noted to be bright red in blood, w/ clots in place, pt c/o tenderness upon palpation of bladder. MD notified of pts presentation and nurses assessment, orders rec to irrigate foley w/ 68ml's of sterile water. Foley irrigated per MD orders, 30 mls of sterile water inserted and noted to return with several large blood clots, new catheter bag/ tubing applied, large amt of bloody urine noted to be draining in foley bag. Pt also medicated for comfort per MDA post op orders. Will cont to monitor and provide comfort measures.  Stormy Card, BSN, RN, RRT, CPAN, CCRN

## 2014-07-16 NOTE — Anesthesia Procedure Notes (Addendum)
Procedure Name: LMA Insertion Date/Time: 07/16/2014 1:20 PM Performed by: Anne Fu Pre-anesthesia Checklist: Patient identified, Emergency Drugs available, Suction available, Patient being monitored and Timeout performed Patient Re-evaluated:Patient Re-evaluated prior to inductionOxygen Delivery Method: Circle system utilized Preoxygenation: Pre-oxygenation with 100% oxygen Intubation Type: IV induction Ventilation: Mask ventilation without difficulty LMA: LMA inserted LMA Size: 4.0 Number of attempts: 1 Placement Confirmation: positive ETCO2 and breath sounds checked- equal and bilateral Tube secured with: Tape

## 2014-07-16 NOTE — Anesthesia Postprocedure Evaluation (Signed)
  Anesthesia Post-op Note  Patient: Jesus [name]  Procedure(s) Performed: Procedure(s): TRANSURETHRAL RESECTION OF BLADDER TUMOR WITH GYRUS (TURBT-GYRUS) (N/A) CYSTOSCOPY WITH PROSTATE ULTRASOUND AND BIOPSY (N/A)  Patient Location: PACU  Anesthesia Type:General  Level of Consciousness: awake  Airway and Oxygen Therapy: Patient Spontanous Breathing  Post-op Pain: mild  Post-op Assessment: Post-op Vital signs reviewed, Patient's Cardiovascular Status Stable, Respiratory Function Stable, Patent Airway, No signs of Nausea or vomiting and Pain level controlled  Post-op Vital Signs: Reviewed and stable  Last Vitals:  Filed Vitals:   07/16/14 1806  BP: 158/95  Pulse: 77  Temp: 37.1 C  Resp: 16    Complications: No apparent anesthesia complications

## 2014-07-16 NOTE — Progress Notes (Signed)
Urine is blood tinged and free of clots.  Patient told to report to nurse for assessment if he sees clots in urine.  Verbalized understanding.

## 2014-07-17 ENCOUNTER — Encounter (HOSPITAL_COMMUNITY): Payer: Self-pay | Admitting: Urology

## 2014-07-17 ENCOUNTER — Telehealth (HOSPITAL_COMMUNITY): Payer: Self-pay | Admitting: Oncology

## 2014-07-17 DIAGNOSIS — C61 Malignant neoplasm of prostate: Secondary | ICD-10-CM | POA: Diagnosis not present

## 2014-07-17 LAB — BASIC METABOLIC PANEL
Anion gap: 7 (ref 5–15)
BUN: 15 mg/dL (ref 6–23)
CHLORIDE: 104 mmol/L (ref 96–112)
CO2: 26 mmol/L (ref 19–32)
CREATININE: 1.56 mg/dL — AB (ref 0.50–1.35)
Calcium: 8.3 mg/dL — ABNORMAL LOW (ref 8.4–10.5)
GFR calc Af Amer: 54 mL/min — ABNORMAL LOW (ref >90)
GFR, EST NON AFRICAN AMERICAN: 46 mL/min — AB (ref >90)
Glucose, Bld: 154 mg/dL — ABNORMAL HIGH (ref 70–99)
POTASSIUM: 3.9 mmol/L (ref 3.5–5.1)
Sodium: 137 mmol/L (ref 135–145)

## 2014-07-17 LAB — HEMOGLOBIN AND HEMATOCRIT, BLOOD
HCT: 44.2 % (ref 39.0–52.0)
Hemoglobin: 14.1 g/dL (ref 13.0–17.0)

## 2014-07-17 LAB — PSA: PSA: 2.25 ng/mL (ref <=4.00)

## 2014-07-17 NOTE — Progress Notes (Signed)
UR completed 

## 2014-07-17 NOTE — Op Note (Signed)
[name], [name]              ACCOUNT NO.:  [id number]  MEDICAL RECORD NO.:  [id number]  LOCATION:  0737                         FACILITY:  [facility]  PHYSICIAN:  Marshall Cork. Jeffie Pollock, M.D.    DATE OF BIRTH:  [birth year]  DATE OF PROCEDURE:  07/16/2014 DATE OF DISCHARGE:                              OPERATIVE REPORT   PROCEDURES: 1. Transrectal prostatic ultrasound with ultrasound-guided prostate     biopsy. 2. Cystoscopy and transurethral resection of large bladder tumor.  PREOPERATIVE DIAGNOSIS:  Probable metastatic bladder cancer with prostate nodule.  POSTOPERATIVE DIAGNOSIS:  Probable metastatic bladder cancer with prostate nodule.  SURGEON:  Marshall Cork. Jeffie Pollock, M.D.  ANESTHESIA:  General.  SPECIMEN:  A 12-core prostate biopsy and tumor fragments from the bladder and prostatic urethra.  DRAINS:  A 24-French hematuria, 3-way Foley catheter.  BLOOD LOSS:  Approximately 100 mL.  COMPLICATIONS:  None.  INDICATIONS:  Jesus [name] is a 64 year old white male who was referred for a 2-week history of hematuria.  He had a CT scan that demonstrated a possible hepatic metastases with bulky pelvic nodal metastases and bladder abnormalities suggestive of bladder tumors.  He had PSA of 2.1, but a nodule on prostate exam, it was felt the prostate ultrasound and biopsy and transurethral resection of bladder tumor were indicated.  FINDINGS AND PROCEDURE:  He was given 2 g of Rocephin and 400 mg of Cipro IV.  A general anesthetic was induced.  He was placed in a lithotomy position.  The 10 MHz transrectal ultrasound probe was assembled and inserted and scanning was performed.  The seminal vesicles were normal in appearance. The prostate had a small hypoechoic lesion in the right mid peripheral zone, but the peripheral zone was otherwise unremarkable without distortion.  There were some calcifications in the mid prostate in the transitional zone, but at the bladder neck, there was obvious  tumor growth extending from the bladder neck into the lumen of the bladder and it appeared to arise out of the prostatic urethra.  The prostate volume was 41.22 mL with a length of 4.9 cm, a height of 3 cm, and a width of 5.31 cm.  Once a thorough diagnostic scan was performed, a 12-core pattern biopsy was performed in the standard configuration with cores from the right base lateral, right base medial, right mid lateral, right mid medial, right apical lateral, right apical medial, and similar cores on the left.  Once the prostate biopsy had been completed, the probe was removed and there was no significant bleeding noted.  At this point, cystoscopy was performed using a 22-French scope and a 12- degree lens.  Inspection revealed a normal anterior urethra.  There were a few mucosal nodules in the bulbar urethra, but the external sphincter appeared intact.  Initially, the anatomy was distorted, so it was difficult to see where the prostate actually began.  The base of the prostate was elevated and once through the prostate into the bladder, there was extensive nodular tumor formation, primarily in the trigone and lateral wall on the left but also at the right bladder neck. Additionally, there were velvety papillary tumors covering the bulk of the bladder wall, which was otherwise  moderately trabeculated with cellules.  Ureteral orifices were never identified.  Once initial cystoscopy was performed, the urethra was calibrated to 30- [place] with [employer] and the [place] resectoscope sheath was inserted.  This was fitted with a 30-degree lens and Iglesias handle and please note that the 30-degree lens was used for the cystoscopy as well. Once the resectoscope was in place, I began by resecting the tumor nodules at the bladder neck with hemostasis being achieved as the resection progressed.  Once again, it was difficult to discern where the prostatic urethra began in the midst of the  bladder tumors.  Eventually, I was able to resect the floor of the bladder down to muscle.  There were a few areas right in the mid trigone and perforation into the perivesical fat, but I felt that these were in the extraperitoneal space and not an issue compared to an intraperitoneal perforation.  I did continue to smooth out the bladder tumors throughout the extent on the floor and the left trigone as well as the right trigone and on the bladder neck up to approximately 2 o'clock on the left and approximately 4 o'clock on the right.  The resection was continued into the prostatic urethra where there was tumor material as well.  Eventually, I was able to identify the verumontanum, which helped me limit the extent of my resection.  Once the tumor had been adequately resected in those areas, some additional resection was performed in the anterior prostatic urethra and bladder neck in order to provide hemostasis.  Finally, the chips were evacuated.  The bladder was inspected.  Additional hemostasis was achieved as needed and once no active bleeding was noted and no retained chips were apparent, inspection revealed an excellent prostatic channel and intact external sphincter.  Once again, the ureteral orifices were never identified as they had been beneath the tumor bed.  The resectoscope was removed.  A good stream was noted with pressure on the bladder, and a 24-French Hematuria 3-way Foley catheter was inserted. The balloon was filled with 30 mL of sterile fluid.  The catheter was irrigated with clear return.  The irrigation port was plugged, and the catheter was placed to straight drainage.  The patient was taken down from lithotomy position.  His anesthetic was reversed, and he was moved to the recovery room in stable condition.  There were no complications.     Marshall Cork. Jeffie Pollock, M.D.     JJW/MEDQ  D:  07/16/2014  T:  07/17/2014  Job:  [id number]

## 2014-07-17 NOTE — Telephone Encounter (Signed)
Dr. Alen Blew called this AM.  Mr. Jesus [name] (DOB[birth year]) is currently admitted to [facility] hospital with gross hematuria.  Urology has evaluated patient and he is suspected to have advanced bladder ca (no pathology noted in [facility] at this time) following TURBT on 1/26.  Patient is a [place] native and therefore, he would be better served locally in his community.  Dr. Alen Blew reports that the patient is functional.  He has a support system consisting of his son, Jesus [name], who will be crucial in the patient's oncology care.  He needs a new patient appointment at [facility] in 1-2 weeks.  Message sent to scheduling regarding this appointment request.  Robynn Pane 07/17/2014

## 2014-07-17 NOTE — Progress Notes (Signed)
Patient ID: Jesus [name], male   DOB: [birth year], 64 y.o.   MRN: [id number] 1 Day Post-Op  Subjective: Jesus [name] is doing well postop day #1 from a TURBT for a large invasive bladder cancer.   He had an episode of clot retention in the PACU that cleared with irrigation and he required irrigation again at 3 am but is doing well now without pain.  The urine is moderately bloody but transparent without clots.  His Hgb is 14.1.  His Cr. Is up to 1.56 from 1.3 preop.  He denies flank pain but is at high risk of ureteral obstruction.   ROS:  Review of Systems  Constitutional: Negative for fever and chills.  Respiratory: Negative for shortness of breath.   Cardiovascular: Negative for chest pain.  Gastrointestinal: Negative for nausea and abdominal pain.    Anti-infectives: Anti-infectives    Start     Dose/Rate Route Frequency Ordered Stop   07/17/14 1200  cefTRIAXone (ROCEPHIN) 1 g in dextrose 5 % 50 mL IVPB - Premix     1 g100 mL/hr over 30 Minutes Intravenous Every 24 hours 07/16/14 1815     07/16/14 0916  cefTRIAXone (ROCEPHIN) 2 g in dextrose 5 % 50 mL IVPB     2 g100 mL/hr over 30 Minutes Intravenous 30 min pre-op 07/16/14 0916 07/16/14 1309   07/16/14 0916  ciprofloxacin (CIPRO) IVPB 400 mg     400 mg200 mL/hr over 60 Minutes Intravenous 60 min pre-op 07/16/14 3329 07/16/14 1437      Current Facility-Administered Medications  Medication Dose Route Frequency Provider Last Rate Last Dose  . acetaminophen (TYLENOL) tablet 650 mg  650 mg Oral Q4H PRN Malka So, MD      . bisacodyl (DULCOLAX) suppository 10 mg  10 mg Rectal Daily PRN Malka So, MD      . cefTRIAXone (ROCEPHIN) 1 g in dextrose 5 % 50 mL IVPB - Premix  1 g Intravenous Q24H Malka So, MD      . dextrose 5 % and 0.45 % NaCl with KCl 20 mEq/L infusion   Intravenous Continuous Malka So, MD 100 mL/hr at 07/16/14 2014    . diphenhydrAMINE (BENADRYL) injection 12.5-25 mg  12.5-25 mg Intravenous Q6H PRN Malka So, MD        Or  . diphenhydrAMINE (BENADRYL) 12.5 MG/5ML elixir 12.5-25 mg  12.5-25 mg Oral Q6H PRN Malka So, MD      . docusate sodium (COLACE) capsule 100 mg  100 mg Oral BID Malka So, MD   100 mg at 07/16/14 2240  . HYDROcodone-acetaminophen (NORCO/VICODIN) 5-325 MG per tablet 1-2 tablet  1-2 tablet Oral Q4H PRN Malka So, MD      . HYDROmorphone (DILAUDID) injection 0.5-1 mg  0.5-1 mg Intravenous Q2H PRN Malka So, MD      . ondansetron [facility]) injection 4 mg  4 mg Intravenous Q4H PRN Malka So, MD      . oxybutynin (DITROPAN) tablet 5 mg  5 mg Oral Q8H PRN Malka So, MD      . zolpidem (AMBIEN) tablet 5 mg  5 mg Oral QHS PRN,MR X 1 Malka So, MD         Objective: Vital signs in last 24 hours: Temp:  [97.3 F (36.3 C)-98.7 F (37.1 C)] 98.3 F (36.8 C) (01/27 0607) Pulse Rate:  [62-81] 71 (01/27 0607) Resp:  [12-16] 16 (01/27 0607) BP: (121-168)/(70-100) 121/70 mmHg (  01/27 0607) SpO2:  [95 %-100 %] 95 % (01/27 0607) Weight:  [73.846 kg (162 lb 12.8 oz)] 73.846 kg (162 lb 12.8 oz) (01/26 0917)  Intake/Output from previous day: 01/26 0701 - 01/27 0700 In: 1100 [I.V.:1100] Out: 3550 [Urine:3550] Intake/Output this shift: Total I/O In: -  Out: 2550 [Urine:2550]   Physical Exam  Vitals reviewed.   Lab Results:   Recent Labs  07/17/14 0550  HGB 14.1  HCT 44.2   BMET  Recent Labs  07/17/14 0550  NA 137  K 3.9  CL 104  CO2 26  GLUCOSE 154*  BUN 15  CREATININE 1.56*  CALCIUM 8.3*   PT/INR No results for input(s): LABPROT, INR in the last 72 hours. ABG No results for input(s): PHART, HCO3 in the last 72 hours.  Invalid input(s): PCO2, PO2  Studies/Results: No results found.   Assessment: s/p Procedure(s): TRANSURETHRAL RESECTION OF BLADDER TUMOR WITH GYRUS (TURBT-GYRUS) CYSTOSCOPY WITH PROSTATE ULTRASOUND AND BIOPSY  His urine is bloody but the catheter is draining well at this time.    His Cr is up a bit post  op.  Plan: Since he is at high risk for ureteral obstruction, I will repeat a Cr in the morning and if it continues to rise, I will consider a renal US.   Continue current care.      LOS: 1 day    Arnold Depinto J 07/17/2014

## 2014-07-17 NOTE — Progress Notes (Signed)
Case discussed with Dr. Jeffie Pollock, Mr. Zuercher and his Son [name]. He will need Medical Oncology follow up at [facility] upon his discharge.  Will help arranging follow up for him with Dr. Whitney Muse next week.

## 2014-07-18 ENCOUNTER — Observation Stay (HOSPITAL_COMMUNITY): Payer: Commercial Managed Care - HMO

## 2014-07-18 DIAGNOSIS — N289 Disorder of kidney and ureter, unspecified: Secondary | ICD-10-CM

## 2014-07-18 DIAGNOSIS — C61 Malignant neoplasm of prostate: Secondary | ICD-10-CM | POA: Diagnosis not present

## 2014-07-18 LAB — BASIC METABOLIC PANEL
Anion gap: 8 (ref 5–15)
BUN: 17 mg/dL (ref 6–23)
CHLORIDE: 107 mmol/L (ref 96–112)
CO2: 25 mmol/L (ref 19–32)
CREATININE: 3.06 mg/dL — AB (ref 0.50–1.35)
Calcium: 8.2 mg/dL — ABNORMAL LOW (ref 8.4–10.5)
GFR, EST AFRICAN AMERICAN: 24 mL/min — AB (ref >90)
GFR, EST NON AFRICAN AMERICAN: 21 mL/min — AB (ref >90)
Glucose, Bld: 135 mg/dL — ABNORMAL HIGH (ref 70–99)
Potassium: 4.1 mmol/L (ref 3.5–5.1)
SODIUM: 140 mmol/L (ref 135–145)

## 2014-07-18 LAB — HEMOGLOBIN AND HEMATOCRIT, BLOOD
HCT: 43.2 % (ref 39.0–52.0)
HEMOGLOBIN: 13.8 g/dL (ref 13.0–17.0)

## 2014-07-18 MED ORDER — HYDROCODONE-ACETAMINOPHEN 5-325 MG PO TABS: 1.0000 | ORAL_TABLET | ORAL | Status: DC | PRN

## 2014-07-18 NOTE — Progress Notes (Addendum)
Patient ID: Jesus [name], male   DOB: 02-03-1951, 64 y.o.   MRN: [id number] 2 Days Post-Op  Subjective: Jesus [name] is doing well without complaints this morning.  His urine is still pink but is clearing.  Dr. Alen Blew has arranged oncologic f/u at [facility].  The path is pending as are the morning chemistries. ROS:  Review of Systems  Constitutional: Negative for fever.  Gastrointestinal: Negative for abdominal pain.    Anti-infectives: Anti-infectives    Start     Dose/Rate Route Frequency Ordered Stop   07/17/14 1200  cefTRIAXone (ROCEPHIN) 1 g in dextrose 5 % 50 mL IVPB - Premix     1 g100 mL/hr over 30 Minutes Intravenous Every 24 hours 07/16/14 1815     07/16/14 0916  cefTRIAXone (ROCEPHIN) 2 g in dextrose 5 % 50 mL IVPB     2 g100 mL/hr over 30 Minutes Intravenous 30 min pre-op 07/16/14 0916 07/16/14 1309   07/16/14 0916  ciprofloxacin (CIPRO) IVPB 400 mg     400 mg200 mL/hr over 60 Minutes Intravenous 60 min pre-op 07/16/14 0109 07/16/14 1437      Current Facility-Administered Medications  Medication Dose Route Frequency Provider Last Rate Last Dose  . acetaminophen (TYLENOL) tablet 650 mg  650 mg Oral Q4H PRN Malka So, MD      . bisacodyl (DULCOLAX) suppository 10 mg  10 mg Rectal Daily PRN Malka So, MD      . cefTRIAXone (ROCEPHIN) 1 g in dextrose 5 % 50 mL IVPB - Premix  1 g Intravenous Q24H Malka So, MD   1 g at 07/17/14 1226  . dextrose 5 % and 0.45 % NaCl with KCl 20 mEq/L infusion   Intravenous Continuous Malka So, MD 100 mL/hr at 07/17/14 1704    . diphenhydrAMINE (BENADRYL) injection 12.5-25 mg  12.5-25 mg Intravenous Q6H PRN Malka So, MD       Or  . diphenhydrAMINE (BENADRYL) 12.5 MG/5ML elixir 12.5-25 mg  12.5-25 mg Oral Q6H PRN Malka So, MD      . docusate sodium (COLACE) capsule 100 mg  100 mg Oral BID Malka So, MD   100 mg at 07/17/14 2216  . HYDROcodone-acetaminophen (NORCO/VICODIN) 5-325 MG per tablet 1-2 tablet  1-2 tablet Oral  Q4H PRN Malka So, MD      . HYDROmorphone (DILAUDID) injection 0.5-1 mg  0.5-1 mg Intravenous Q2H PRN Malka So, MD      . ondansetron [facility]) injection 4 mg  4 mg Intravenous Q4H PRN Malka So, MD   4 mg at 07/17/14 0816  . oxybutynin (DITROPAN) tablet 5 mg  5 mg Oral Q8H PRN Malka So, MD      . zolpidem [facility]) tablet 5 mg  5 mg Oral QHS PRN,MR X 1 Malka So, MD         Objective: Vital signs in last 24 hours: Temp:  [97.9 F (36.6 C)-98.2 F (36.8 C)] 98.2 F (36.8 C) (01/28 0455) Pulse Rate:  [63-74] 71 (01/28 0455) Resp:  [16-18] 18 (01/28 0455) BP: (120-141)/(71-87) 120/85 mmHg (01/28 0455) SpO2:  [98 %-99 %] 98 % (01/28 0455)  Intake/Output from previous day: 01/27 0701 - 01/28 0700 In: 3106.7 [P.O.:600; I.V.:2376.7; IV Piggyback:50] Out: 2950 [Urine:2950] Intake/Output this shift:     Physical Exam  Constitutional: He is well-developed, well-nourished, and in no distress.  Cardiovascular: Normal rate, regular rhythm and normal heart sounds.  Pulmonary/Chest: Effort normal and breath sounds normal.  Musculoskeletal: Normal range of motion. He exhibits no edema or tenderness.    Lab Results:   Recent Labs  07/17/14 0550 07/18/14 0550  HGB 14.1 13.8  HCT 44.2 43.2   BMET  Recent Labs  07/17/14 0550  NA 137  K 3.9  CL 104  CO2 26  GLUCOSE 154*  BUN 15  CREATININE 1.56*  CALCIUM 8.3*   PT/INR No results for input(s): LABPROT, INR in the last 72 hours. ABG No results for input(s): PHART, HCO3 in the last 72 hours.  Invalid input(s): PCO2, PO2  Studies/Results: No results found.   Assessment: s/p Procedure(s): TRANSURETHRAL RESECTION OF BLADDER TUMOR WITH GYRUS (TURBT-GYRUS) CYSTOSCOPY WITH PROSTATE ULTRASOUND AND BIOPSY  He is doing well post op and his urine is clearing.   Plan: I will d/c the foley and IV this morning and reassess for discharge later this morning.  Addendum:  His Cr is up to 3.09 today and I am  concerned that he has ureteral obstruction after the TURBT since the tumor invaded the trigone and I never saw the UO's.  There is also a chance he could have a urinoma but I think that is less likely.  I will get a STAT CT AP without contrast and if there is ureteral obstruction, I will consult IR for possible percs.      LOS: 2 days    Malka So 07/18/2014

## 2014-07-18 NOTE — Progress Notes (Signed)
Patient ID: Jesus [name], male   DOB: 01-Oct-1950, 64 y.o.   MRN: [id number]   He continues to void well and the urine has cleared.   He had possibly one episode of incontinence.   His Path shows a HG muscle invasive urothelial cancer from the bladder specimen and he has stroma invasion with the same tumor in multiple cores from the prostate biopsy.    He will have a repeat Cr in the AM and if it is falling he can be discharged home.  If the Cr is rising, he will have a repeat renal US and consideration of replacement of the foley.  A nephrology consult will be obtained as well.

## 2014-07-18 NOTE — Discharge Instructions (Signed)
Bladder Cancer Bladder cancer is an abnormal growth of tissue in your bladder. Your bladder is the balloon-like sac in your pelvis. It collects and stores urine that comes from the kidneys through the ureters. The bladder wall is made of layers. If cancer spreads into these layers and through the wall of the bladder, it becomes more difficult to treat.  There are four stages of bladder cancer:  Stage I. Cancer at this stage occurs in the bladder's inner lining but has not invaded the muscular bladder wall.  Stage II. At this stage, cancer has invaded the bladder wall but is still confined to the bladder.  Stage III. By this stage, the cancer cells have spread through the bladder wall to surrounding tissue. They may also have spread to the prostate in men or the uterus or vagina in women.  Stage IV. By this stage, cancer cells may have spread to the lymph nodes and other organs, such as your lungs, bones, or liver. RISK FACTORS Although the cause of bladder cancer is not known, the following risk factors can increase your chances of getting bladder cancer:   Smoking.   Occupational exposures, such as rubber, leather, textile, dyes, chemicals, and paint.  Being white.  Age.   Being male.   Having chronic bladder inflammation.   Having a bladder cancer history.   Having a family history of bladder cancer (heredity).   Having had chemotherapy or radiation therapy to the pelvis.   Being exposed to arsenic.  SYMPTOMS   Blood in the urine.   Pain with urination.   Frequent bladder or urine infections.  Increase in urgency and frequency of urination. DIAGNOSIS  Your health care provider may suspect bladder cancer based on your description of urinary symptoms or based on the finding of blood or infection in the urine (especially if this has recurred several times). Other tests or procedures that may be performed include:   A narrow tube being inserted into your bladder  through your urethra (cystoscopy) in order to view the lining of your bladder for tumors.   A biopsy to sample the tumor to see if cancer is present.  If cancer is present, it will then be staged to determine its severity and extent. It is important to know how deeply into the bladder wall the cancer has grown and whether the cancer has spread to any other parts of your body. Staging may require blood tests or special scans such as a CT scan, MRI, bone scan, or chest X-ray.  TREATMENT  Once your cancer has been diagnosed and staged, you should discuss a treatment plan with your health care provider. Based on the stage of the cancer, one treatment or a combination of treatments may be recommended. The most common forms of treatment are:   Surgery. Procedures that may be done include transurethral resection and cystectomy.  Radiation therapy. This is infrequently used to treat bladder cancer.   Chemotherapy. During this treatment, drugs are used to kill cancer cells.  Immunotherapy. This is usually administered directly into the bladder. HOME CARE INSTRUCTIONS  Take medicines only as directed by your health care provider.   Maintain a healthy diet.   Consider joining a support group. This may help you learn to cope with the stress of having bladder cancer.   Seek advice to help you manage treatment side effects.   Keep all follow-up visits as directed by your health care provider.   Inform your cancer specialist if you are  stress of having bladder cancer.    · Seek advice to help you manage treatment side effects.    · Keep all follow-up visits as directed by your health care provider.    · Inform your cancer specialist if you are admitted to the hospital.    SEEK MEDICAL CARE IF:  · There is blood in your urine.  · You have symptoms of a urinary tract infection. These include:  ¨ Tiredness.  ¨ Shakiness.  ¨ Weakness.  ¨ Muscle aches.  ¨ Abdominal pain.  ¨ Frequent and intense urge to urinate (in young women).  ¨ Burning feeling in the bladder or urethra during urination (in young women).  SEEK IMMEDIATE MEDICAL CARE IF:   You are unable to urinate.  Document Released: 06/10/2003 Document  Revised: 10/22/2013 Document Reviewed: 11/28/2012  Exit[employer]® Patient Information ©2015 [employer]. This information is not intended to replace advice given to you by your health care provider. Make sure you discuss any questions you have with your health care provider.

## 2014-07-18 NOTE — Progress Notes (Signed)
Foley cath removed. Patient tolerated well. Denies any discomfort at this time.

## 2014-07-18 NOTE — Progress Notes (Signed)
Patient ID: Jesus [name], male   DOB: Apr 26, 1951, 64 y.o.   MRN: [id number]  The CT scan shows minimal dilation on the right from the bladder up but there doesn't appear to be left hydro.  There is a peripelvic cyst that is stable.     There is some increased stranding around the superior bladder but no obvious urinoma.   I don't think the findings merit a perc tube just yet.  I will repeat labs in the am and if the Creatinine continues to rise, I will reconsider.

## 2014-07-18 NOTE — Progress Notes (Signed)
UR completed 

## 2014-07-19 DIAGNOSIS — C61 Malignant neoplasm of prostate: Secondary | ICD-10-CM | POA: Diagnosis not present

## 2014-07-19 LAB — BASIC METABOLIC PANEL
ANION GAP: 9 (ref 5–15)
BUN: 19 mg/dL (ref 6–23)
CALCIUM: 8.3 mg/dL — AB (ref 8.4–10.5)
CO2: 25 mmol/L (ref 19–32)
Chloride: 107 mmol/L (ref 96–112)
Creatinine, Ser: 2.05 mg/dL — ABNORMAL HIGH (ref 0.50–1.35)
GFR calc Af Amer: 39 mL/min — ABNORMAL LOW (ref >90)
GFR, EST NON AFRICAN AMERICAN: 33 mL/min — AB (ref >90)
Glucose, Bld: 123 mg/dL — ABNORMAL HIGH (ref 70–99)
POTASSIUM: 4 mmol/L (ref 3.5–5.1)
Sodium: 141 mmol/L (ref 135–145)

## 2014-07-19 NOTE — Care Management Note (Signed)
    Page 1 of 1   07/19/2014     1:31:34 PM CARE MANAGEMENT NOTE 07/19/2014  Patient:  Jesus [name], Jesus [name]   Account Number:  [id number]  Date Initiated:  07/19/2014  Documentation initiated by:  Dessa Phi  Subjective/Objective Assessment:   64 y/o m admitted w/bladder ca.     Action/Plan:   From home.   Anticipated DC Date:  07/19/2014   Anticipated DC Plan:  [place]  CM consult      Choice offered to / List presented to:             Status of service:  Completed, signed off Medicare Important Message given?   (If response is "NO", the following Medicare IM given date fields will be blank) Date Medicare IM given:   Medicare IM given by:   Date Additional Medicare IM given:   Additional Medicare IM given by:    Discharge Disposition:  HOME/SELF CARE  Per UR Regulation:  Reviewed for med. necessity/level of care/duration of stay  If discussed at [place] of Stay Meetings, dates discussed:    Comments:  07/19/14 Dessa Phi RN BSN NCM [phone] No d/c needs or orders.

## 2014-07-19 NOTE — Progress Notes (Signed)
Discharge instructions reviewed with patient and son [name] at the bedside. Patient and son verbalize understanding. Patient states he has no pain and does not need script for pain upon discharge. Patient is being discharged home with son. Patient confirms he has all personal belongings in his possession.

## 2014-07-19 NOTE — Discharge Summary (Signed)
  Date of admission: 07/16/2014  Date of discharge: 07/19/2014  Admission diagnosis: bladder mass  Discharge diagnosis: T4 bladder cancer  Secondary diagnoses: acute kidney injury  History and Physical: For full details, please see admission history and physical. Briefly, Jesus [name] is a 64 y.o. year old patient with painless gross hematuria and bladder mass. He also had a palpable prostate nodule. He elected for TURBT and prostate biopsy.  Hospital Course: His post-op course was complicated by acute kidney injury and cre which peaked at 3 on POD#2. Renal colic CT showed mild hydro, and no fluid around bladder with catheter removed. PCN and nephrology consult was considered unless cre improved. On POD#3, he was without complaint, urinating clear yellow urine, and his cre returned to 2. He was discharged home.  Laboratory values:  Recent Labs  07/17/14 0550 07/18/14 0550  HGB 14.1 13.8  HCT 44.2 43.2    Disposition: Home  Discharge instruction: Call if temp >101.5. Call if tomato juice colored urine and inability to void.   Discharge medications:  See medication reconciliation..med   Followup:  Follow-up Information    Follow up with Malka So, MD.   Specialty:  Urology   Why:  Call for a f/u visit in 2-3 weeks at the [facility] office.    Contact information:   [address] [place] [address] [phone]

## 2014-07-19 NOTE — Progress Notes (Signed)
Discharge summary sent to payer through [employer]  

## 2014-07-31 ENCOUNTER — Encounter (HOSPITAL_COMMUNITY): Payer: Self-pay | Admitting: Hematology & Oncology

## 2014-07-31 ENCOUNTER — Encounter (HOSPITAL_COMMUNITY): Payer: Commercial Managed Care - HMO | Attending: Hematology & Oncology | Admitting: Hematology & Oncology

## 2014-07-31 VITALS — BP 120/68 | HR 78 | Temp 97.3°F | Resp 16 | Ht 69.25 in | Wt 167.0 lb

## 2014-07-31 DIAGNOSIS — C679 Malignant neoplasm of bladder, unspecified: Secondary | ICD-10-CM | POA: Diagnosis present

## 2014-07-31 DIAGNOSIS — C675 Malignant neoplasm of bladder neck: Secondary | ICD-10-CM

## 2014-07-31 LAB — CBC WITH DIFFERENTIAL/PLATELET
BASOS PCT: 1 % (ref 0–1)
Basophils Absolute: 0.1 10*3/uL (ref 0.0–0.1)
Eosinophils Absolute: 0.3 10*3/uL (ref 0.0–0.7)
Eosinophils Relative: 2 % (ref 0–5)
HEMATOCRIT: 43.8 % (ref 39.0–52.0)
Hemoglobin: 14.5 g/dL (ref 13.0–17.0)
LYMPHS ABS: 2.3 10*3/uL (ref 0.7–4.0)
Lymphocytes Relative: 17 % (ref 12–46)
MCH: 29.5 pg (ref 26.0–34.0)
MCHC: 33.1 g/dL (ref 30.0–36.0)
MCV: 89 fL (ref 78.0–100.0)
MONOS PCT: 5 % (ref 3–12)
Monocytes Absolute: 0.6 10*3/uL (ref 0.1–1.0)
NEUTROS ABS: 9.9 10*3/uL — AB (ref 1.7–7.7)
Neutrophils Relative %: 75 % (ref 43–77)
PLATELETS: 226 10*3/uL (ref 150–400)
RBC: 4.92 MIL/uL (ref 4.22–5.81)
RDW: 13.4 % (ref 11.5–15.5)
WBC: 13.1 10*3/uL — AB (ref 4.0–10.5)

## 2014-07-31 LAB — COMPREHENSIVE METABOLIC PANEL
ALT: 16 U/L (ref 0–53)
AST: 23 U/L (ref 0–37)
Albumin: 3.8 g/dL (ref 3.5–5.2)
Alkaline Phosphatase: 71 U/L (ref 39–117)
Anion gap: 6 (ref 5–15)
BUN: 20 mg/dL (ref 6–23)
CO2: 28 mmol/L (ref 19–32)
Calcium: 8.8 mg/dL (ref 8.4–10.5)
Chloride: 105 mmol/L (ref 96–112)
Creatinine, Ser: 1.47 mg/dL — ABNORMAL HIGH (ref 0.50–1.35)
GFR calc non Af Amer: 50 mL/min — ABNORMAL LOW (ref >90)
GFR, EST AFRICAN AMERICAN: 58 mL/min — AB (ref >90)
GLUCOSE: 113 mg/dL — AB (ref 70–99)
POTASSIUM: 4.4 mmol/L (ref 3.5–5.1)
Sodium: 139 mmol/L (ref 135–145)
TOTAL PROTEIN: 7.2 g/dL (ref 6.0–8.3)
Total Bilirubin: 0.5 mg/dL (ref 0.3–1.2)

## 2014-07-31 NOTE — Progress Notes (Deleted)
     Jesus Cahill, MD  [address]  [place] [address]    DIAGNOSIS: No matching staging information was found for the patient.  SUMMARY OF ONCOLOGIC HISTORY:  No history exists.    CURRENT THERAPY:  INTERVAL HISTORY: Jesus [name] 64 y.o. male returns for   MEDICAL HISTORY: Past Medical History  Diagnosis Date  . High cholesterol   . Depression     hx of   . Arthritis     has Bladder cancer and Acute renal insufficiency on his problem list.     has No Known Allergies.  Jesus [name] does not currently have medications on file.  SURGICAL HISTORY: Past Surgical History  Procedure Laterality Date  . Knee surgery    . Tonsillectomy    . Transurethral resection of bladder tumor with gyrus (turbt-gyrus) N/A 07/16/2014    Procedure: TRANSURETHRAL RESECTION OF BLADDER TUMOR WITH GYRUS (TURBT-GYRUS);  Surgeon: Malka So, MD;  Location: [facility];  Service: Urology;  Laterality: N/A;  . Cystoscopy with biopsy N/A 07/16/2014    Procedure: CYSTOSCOPY WITH PROSTATE ULTRASOUND AND BIOPSY;  Surgeon: Malka So, MD;  Location: [facility];  Service: Urology;  Laterality: N/A;    SOCIAL HISTORY: History   Social History  . Marital Status: Single    Spouse Name: N/A  . Number of Children: N/A  . Years of Education: N/A   Occupational History  . Not on file.   Social History Main Topics  . Smoking status: Never Smoker   . Smokeless tobacco: Never Used  . Alcohol Use: No  . Drug Use: No  . Sexual Activity: Not on file   Other Topics Concern  . Not on file   Social History Narrative    FAMILY HISTORY: No family history on file.  ROS  PHYSICAL EXAMINATION  ECOG PERFORMANCE STATUS: {CHL ONC ECOG PS:[phone]}  There were no vitals filed for this visit.  Physical Exam  LABORATORY DATA:  CBC    Component Value Date/Time   HGB 13.8 07/18/2014 0550   HCT 43.2 07/18/2014 0550   CMP     Component Value Date/Time   NA 141 07/19/2014 0520   K 4.0  07/19/2014 0520   CL 107 07/19/2014 0520   CO2 25 07/19/2014 0520   GLUCOSE 123* 07/19/2014 0520   BUN 19 07/19/2014 0520   CREATININE 2.05* 07/19/2014 0520   CALCIUM 8.3* 07/19/2014 0520   GFRNONAA 33* 07/19/2014 0520   GFRAA 39* 07/19/2014 0520     PENDING LABS:   RADIOGRAPHIC STUDIES:  No results found.   PATHOLOGY:     ASSESSMENT and THERAPY PLAN:    No problem-specific assessment & plan notes found for this encounter.   All questions were answered. The patient knows to call the clinic with any problems, questions or concerns. We can certainly see the patient much sooner if necessary.  I spent {CHL ONC TIME VISIT - OEHOZ:[id number]} counseling the patient face to face. The total time spent in the appointment was {CHL ONC TIME VISIT - CWUGQ:[id number]}.  Jesus [name] 07/31/2014

## 2014-07-31 NOTE — Progress Notes (Signed)
Jesus [name] presented for [facility]. Labs per MD order drawn via Peripheral Line 23 gauge needle inserted in right AC  Good blood return present. Procedure without incident.  Needle removed intact. Patient tolerated procedure well.

## 2014-07-31 NOTE — Patient Instructions (Signed)
[place] at [facility]  Discharge Instructions:  Exam and discussion by Dr. Whitney Muse. Need to determine your stage in order to plan the most appropriate treatment for you. Will get MRI of your brain and will also do PET scan.   For PET Scan: Nothing to eat or drink 6 hours prior to the scan.  Nothing with sugar in it.  _______________________________________________________________  Thank you for choosing [place] at [facility] to provide your oncology and hematology care.  To afford each patient quality time with our providers, please arrive at least 15 minutes before your scheduled appointment.  You need to re-schedule your appointment if you arrive 10 or more minutes late.  We strive to give you quality time with our providers, and arriving late affects you and other patients whose appointments are after yours.  Also, if you no show three or more times for appointments you may be dismissed from the clinic.  Again, thank you for choosing [place] at [place] hope is that these requests will allow you access to exceptional care and in a timely manner. _______________________________________________________________  If you have questions after your visit, please contact our office at [phone] between the hours of 8:30 a.m. and 5:00 p.m. Voicemails left after 4:30 p.m. will not be returned until the following business day. _______________________________________________________________  For prescription refill requests, have your pharmacy contact our office. _______________________________________________________________  Recommendations made by the consultant and any test results will be sent to your referring physician. _______________________________________________________________Positron Emission Tomography (PET Scan) PET stands for positron emission tomography. This is a test similar to an X-ray.  Pictures can be taken of a body part after injection of a very small dose of a chemical called a radionuclide. This is combined with sugar, water, or ammonia to give off tiny particles called positrons. The positrons emitted are like small bursts of energy that can be detected by a scanner. They are processed by a computer to create images. These images can be used to study different diseases. They are often used to study cancer and cancer therapy. A scan of the entire body can be done and used to study all its parts. Because this test is tagged to a sugar used by cells, the bursts of energy show up differently in cells that use sugar faster. The computer is able to produce a color-coded picture based on this. The colors and amount of brightness on a PET image show different levels of tissue or organ function. For example, a cancer grows faster than healthy tissue and uses more sugar than normal tissue. It will absorb more of the substance injected. This causes it to appear brighter than normal tissue on the PET image. A specialist will read and explain the images. Other examinations, such as recent CT (or CAT) scans or MRI scans may help with interpretation and should be brought along. There are usually no restrictions after the test. You should drink plenty of fluids to flush the radioactive substance from your body. BEFORE THE PROCEDURE   PET is usually an outpatient procedure. Wear comfortable, loose-fitting clothes.  Do not eat for four hours before the scan. You will be encouraged to drink water.  Your caregiver will instruct you regarding the use of medications before the test.  Note: Diabetic patients should ask for any specific diet guidelines to control glucose (sugar) levels during the day of the test. There are limitations with the test  if your blood sugar is not controlled during or before the test.  Be on time because of the rapid decay of the radioactive material that must be  injected. PROCEDURE  Before the procedure begins a small amount of harmless radioactive material will be injected into a vein. This means you will have a needle stick. It will take from 30 minutes to one hour for the material to travel around your body in preparation for the scan. You will lie on a cushioned table and be moved through the center of a machine that looks like a large doughnut. This is the machine that detects the positrons. It is connected to a computer that produces images that can be viewed on a monitor. This will take about 30 minutes to an hour, during which you must remain still. Let your caregiver know if this will be difficult for you. Also, let your caregiver know if you need a sedative or help dealing with claustrophobia (feeling uncomfortable in enclosed spaces). HOME CARE INSTRUCTIONS   For the protection of your privacy, test results can not be given over the phone. Make sure you receive the results of your test. Ask as to how these results are to be obtained if you have not been informed. It is your responsibility to obtain your test results.  Drink several 8-once glasses of water following the test to flush the small amount of radioactive material out of your body.  Keep your follow-up appointments. Document Released: 12/12/2002 Document Revised: 08/30/2011 Document Reviewed: 09/19/2013 [facility] Patient Information 2015 [place], [place]. This information is not intended to replace advice given to you by your health care provider. Make sure you discuss any questions you have with your health care provider. Magnetic Resonance Imaging Magnetic resonance imaging (MRI) is an imaging test that produces clear digital pictures of the inside of your body without using X-rays. The MRI scanner uses radio waves and a magnetic field to create the images. The MRI pictures may provide different details than images obtained through X-rays, CT scans, or ultrasounds. Contrast material may be  injected to make MRI images even more clear. In a standard MRI scanner, the area of your body being studied will be in the center opening of the scanner. In open MRI scanners, the scanner does not entirely surround your body.  LET [facility] CARE PROVIDER KNOW ABOUT:  Previous surgeries you have had.  Any metal you may have in your body. The magnet used in MRI can cause metal objects in your body to move. This includes:  A pacemaker or any other implants, such as an implanted neurostimulator, a metallic ear implant, or a metallic object within the eye socket.  Metal splinters in your body.  Any bullet fragments.  A port for delivering insulin or chemotherapy.  Any tattoos. Some red dyes contain iron which is sometimes a problem.  If you are pregnant or think you may be pregnant.  If you are breastfeeding.  If you are afraid of cramped spaces (claustrophobic). If claustrophobia is a problem, it usually can be relieved with medicines or the use of the open MRI scanner.  Any allergies you have.  All medicines you are taking, including vitamins, herbs, eye drops, creams, and over-the-counter medicines. RISKS AND COMPLICATIONS  Generally, MRI is a safe procedure. However, problems can occur and include:  If a metal implant is present but is undetected, it may be affected by the strong magnetic field. In addition, if the implant is close to the  examination site, it may be hard to get high-quality images.  If you are pregnant:  MRI generally should be avoided during the first three months of pregnancy. It is not known what effects the MRI may have on a fetus. Ultrasound is preferred at this time unless a serious condition is suspected that is best studied by MRI. MRI should be considered if there is a substantial risk of missing the correct diagnosis if MRI is not done.  If you are breastfeeding:  You should inform your health care provider and ask how to proceed. You may pump breast  milk before the exam for use until the contrast material, if used, has cleared from the body. BEFORE THE PROCEDURE   You will be asked to remove all metal, including:  Your watch, jewelry, and other metal objects.  Some makeup also contains traces of metal and may need to be removed.  Braces and fillings normally are not a problem. PROCEDURE  You may be given earplugs or headphones to listen to music. The MRI scanner can be noisy.  You may be injected with contrast material.  The standard MRI is done in a long, magnetic chamber. You will lie down on a platform that slides into the magnetic chamber. Once inside, you will still be able to talk to the person performing the test. The open MRI scanner is open on at least one side of the scanner.  You will be asked to hold very still. You will be told when you can shift position. You may have to wait a few minutes to make sure the images are readable. AFTER THE PROCEDURE   You may resume normal activities right away.  If you were given contrast material, it will pass naturally through your body within a day.  A person experienced in MRI (radiologist) will analyze the results and send a report to your health care provider, along with an explanation of the results. Document Released: 06/04/2000 Document Revised: 10/22/2013 Document Reviewed: 08/02/2013 [facility] Patient Information 2015 [place], [place]. This information is not intended to replace advice given to you by your health care provider. Make sure you discuss any questions you have with your health care provider.

## 2014-07-31 NOTE — Progress Notes (Signed)
[place] CONSULT NOTE  Patient Care Team: Delphina Cahill, MD as PCP - General (Internal Medicine)  CHIEF COMPLAINTS/PURPOSE OF CONSULTATION:  "I have bladder cancer" T4a, N2, MX   PROCEDURES: 07/16/2014 1. Transrectal prostatic ultrasound with ultrasound-guided prostate  biopsy. 2. Cystoscopy and transurethral resection of large bladder tumor.  PREOPERATIVE DIAGNOSIS: Probable metastatic bladder cancer with prostate nodule.  POSTOPERATIVE DIAGNOSIS: Probable metastatic bladder cancer with prostate nodule.   HISTORY OF PRESENTING ILLNESS:  Jesus [name] 64 y.o. male is here because of newly diagnosed bladder cancer.  He was referred by Dr. Nevada Crane to urology for gross hematuria. CT scan was performed on 07/09/2014 that showed irregular and lobular bladder wall thickening suspicious for TCC. Pelvic and retroperitoneal adenopathy was noted. An abnormality on the hepatic dome was also reported, but additional evaluation was recommended. PSA and Hgb were WNL. On urologic exam he was noted to have a palpable nodule in the prostate approximately 1cm in size.  He has undergone a Transrectal prostatic U/S and prostate biopsy, cystoscopy and TURP with Dr. Jeffie Pollock. 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 with invasion into the prostate.     Bladder cancer   07/09/2014 Imaging  CT A/P Irregular and lobular bladder wall thickening suspicious for transitional cell carcinoma. Considerable associated pelvic and retroperitoneal adenopathy. 1.4 by 1.3 cm complex and likely enhancing lesion of the right hepatic lobe dome   07/16/2014 - 07/19/2014 Hospital Admission Hospital admission for TURP. Course complicated by ARF   12/02/4313 Procedure TRANSURETHRAL RESECTION OF BLADDER TUMOR WITH GYRUS (TURBT-GYRUS) CYSTOSCOPY WITH PROSTATE ULTRASOUND AND BIOPSY.  Dr. Irine Seal     MEDICAL HISTORY:  Past Medical History  Diagnosis Date  . High  cholesterol   . Depression     hx of   . Arthritis   . Bladder cancer     SURGICAL HISTORY: Past Surgical History  Procedure Laterality Date  . Knee surgery    . Tonsillectomy    . Transurethral resection of bladder tumor with gyrus (turbt-gyrus) N/A 07/16/2014    Procedure: TRANSURETHRAL RESECTION OF BLADDER TUMOR WITH GYRUS (TURBT-GYRUS);  Surgeon: Malka So, MD;  Location: [facility];  Service: Urology;  Laterality: N/A;  . Cystoscopy with biopsy N/A 07/16/2014    Procedure: CYSTOSCOPY WITH PROSTATE ULTRASOUND AND BIOPSY;  Surgeon: Malka So, MD;  Location: [facility];  Service: Urology;  Laterality: N/A;    SOCIAL HISTORY: History   Social History  . Marital Status: Single    Spouse Name: N/A  . Number of Children: N/A  . Years of Education: N/A   Occupational History  . Not on file.   Social History Main Topics  . Smoking status: Never Smoker   . Smokeless tobacco: Never Used  . Alcohol Use: No  . Drug Use: No  . Sexual Activity: Not on file     Comment: Divorced. 1 son who lives in [place].  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 Non smoker, no tobacco use. No significant ETOH use   Other Topics Concern  . Not on file   Social History Narrative    FAMILY HISTORY: Family History  Problem Relation Age of Onset  . Cancer Mother   . Stroke Father   . Cancer Sister    indicated that his mother is deceased. He indicated that his father is deceased. He indicated that his sister is alive. He indicated that his maternal grandmother is deceased.  He indicated that his maternal grandfather is deceased. He indicated that his paternal grandmother is deceased. He indicated that his paternal grandfather is deceased.  Father died at 35 from a CVA Mother died at 50 from breast cancer, one sister had breast cancer and is still living  ALLERGIES:  has No Known Allergies.  MEDICATIONS:  Current Outpatient Prescriptions  Medication Sig Dispense Refill  . folic acid  (V-R FOLIC ACID) 073 MCG tablet Take 1 tablet (400 mcg total) by mouth daily. (Patient not taking: Reported on 08/13/2014) 30 tablet 6   No current facility-administered medications for this visit.    Review of Systems  Constitutional: Negative.   HENT: Negative.   Eyes: Negative.   Respiratory: Negative.   Cardiovascular: Negative.   Gastrointestinal: Negative.   Genitourinary: Positive for urgency and hematuria.       Occasional incontinence, nocturia  Musculoskeletal: Positive for joint pain.  Skin: Negative.   Neurological:       Son reports significant memory issues and balance issues which have worsened over the last several years  Endo/Heme/Allergies: Negative.   Psychiatric/Behavioral: Negative.     PHYSICAL EXAMINATION: ECOG PERFORMANCE STATUS: 1 - Symptomatic but completely ambulatory  Filed Vitals:   07/31/14 1429  BP: 120/68  Pulse: 78  Temp: 97.3 F (36.3 C)  Resp: 16   Filed Weights   07/31/14 1429  Weight: 167 lb (75.751 kg)     Physical Exam  Constitutional: He is oriented to person, place, and time and well-developed, well-nourished, and in no distress.  HENT:  Head: Normocephalic and atraumatic.  Nose: Nose normal.  Mouth/Throat: Oropharynx is clear and moist. No oropharyngeal exudate.  Eyes: Conjunctivae and EOM are normal. Pupils are equal, round, and reactive to light. Right eye exhibits no discharge. Left eye exhibits no discharge. No scleral icterus.  Neck: Normal range of motion. Neck supple. No tracheal deviation present. No thyromegaly present.  Cardiovascular: Normal rate, regular rhythm and normal heart sounds.  Exam reveals no gallop and no friction rub.   No murmur heard. Pulmonary/Chest: Effort normal and breath sounds normal. He has no wheezes. He has no rales.  Abdominal: Soft. Bowel sounds are normal. He exhibits no distension and no mass. There is no tenderness. There is no rebound and no guarding.  Musculoskeletal: Normal range of  motion. He exhibits no edema.  Lymphadenopathy:    He has no cervical adenopathy.  Neurological: He is alert and oriented to person, place, and time. He has normal reflexes. No cranial nerve deficit. Gait normal. Coordination normal.  Skin: Skin is warm and dry. No rash noted.  Psychiatric: Mood and affect normal.  Somewhat abnormal affect, pleasant  Nursing note and vitals reviewed.    LABORATORY DATA:  I have reviewed the data as listed Lab Results  Component Value Date   WBC 13.1* 07/31/2014   HGB 14.5 07/31/2014   HCT 43.8 07/31/2014   MCV 89.0 07/31/2014   PLT 226 07/31/2014     Chemistry      Component Value Date/Time   NA 139 07/31/2014 1542   K 4.4 07/31/2014 1542   CL 105 07/31/2014 1542   CO2 28 07/31/2014 1542   BUN 20 07/31/2014 1542   CREATININE 1.47* 07/31/2014 1542      Component Value Date/Time   CALCIUM 8.8 07/31/2014 1542   ALKPHOS 71 07/31/2014 1542   AST 23 07/31/2014 1542   ALT 16 07/31/2014 1542   BILITOT 0.5 07/31/2014 1542  PATHOLOGY:  Diagnosis 1. Prostate, needle biopsy(ies), right base lateral - BENIGN PROSTATIC GLANDS AND STROMA WITH FEATURES OF ATROPHY. 2. Prostate, needle biopsy(ies), right base medial - BENIGN PROSTATIC GLANDS AND STROMA. 3. 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 Prostate, needle biopsy(ies), right mid medial - HIGH GRADE UROTHELIAL CARCINOMA INVOLVING PROSTATIC GLANDS AND DUCTS. SEE COMMENT. 5. Prostate, needle biopsy(ies), right apex lateral - HIGH GRADE UROTHELIAL CARCINOMA INVOLVING PROSTATIC GLANDS AND DUCTS. SEE COMMENT. 6. Prostate, needle biopsy(ies), right apex medial - HIGH GRADE UROTHELIAL CARCINOMA INVOLVING PROSTATIC GLANDS AND DUCTS, SEE COMMENT. 7. Prostate, needle biopsy(ies), left base lateral - BENIGN PROSTATIC GLANDS AND STROMA. 8. Prostate, needle biopsy(ies), left base medial - BENIGN PROSTATIC GLANDS AND  STROMA. 9. 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 SEE COMMENT. 10. Prostate, needle biopsy(ies), left mid medial - HIGH GRADE UROTHELIAL CARCINOMA INVOLVING PROSTATIC GLANDS AND DUCTS, SEE COMMENT. 11. Prostate, needle biopsy(ies), left apex lateral - BENIGN PROSTATIC GLANDS AND STROMA WITH FEATURES OF ATROPHY. 12. Prostate, needle biopsy(ies), left apex medial - HIGH GRADE UROTHELIAL CARCINOMA INVOLVING PROSTATIC GLANDS AND DUCTS, SEE 1 of 3 FINAL for Maffett, Alim L ([id number]) Diagnosis(continued) COMMENT. 13. Bladder, transurethral resection, bladder neck, bladder tumor, prostate - INVASIVE HIGH GRADE UROTHELIAL CARCINOMA. - TUMOR INVOLVES SMOOTH MUSCLE. Microscopic Comment 4. , 5, 6, 9, and 10. In each of the parts, the needle core demonstrates involvement of prostatic glands and ducts by high grade epithelial neoplasm demonstrating strong diffuse cytokeratin 903 and p63 immunostain expression consistent with high grade urothelial carcinoma. There is evidence of invasion present in part 9 and areas suspicious for invasion in part 4. These parts were reviewed by Dr Saralyn Pilar who concurs  RADIOGRAPHIC STUDIES: I have personally reviewed the radiological images as listed and agreed with the findings in the report. Mr Jeri Cos Wo Contrast  08/12/2014   CLINICAL DATA:  New diagnosis of bladder carcinoma. No reported neurologic symptoms.  EXAM: MRI HEAD WITHOUT AND WITH CONTRAST  TECHNIQUE: Multiplanar, multiecho pulse sequences of the brain and surrounding structures were obtained without and with intravenous contrast.  CONTRAST:  43mL MULTIHANCE GADOBENATE DIMEGLUMINE 529 MG/ML IV SOLN  COMPARISON:  PET scan 08/08/2014 reveals hepatic lesions, and widespread nodal metastases, but no osseous disease.  FINDINGS: The patient was unable to remain motionless for the exam. Small or subtle lesions could be overlooked.   No evidence for acute infarction, hemorrhage, mass lesion, hydrocephalus, or extra-axial fluid. Generalized cerebral and cerebellar atrophy. Relatively minor white matter disease. Pituitary, pineal, and cerebellar tonsils unremarkable. No upper cervical lesions. Flow voids are maintained throughout the carotid, basilar, and vertebral arteries. There are no areas of chronic hemorrhage.  Post infusion, no abnormal enhancement of the brain or meninges. Minor LEFT maxillary sinus layering fluid. No osseous lesions. Major dural venous sinuses patent. Extracranial soft tissues unremarkable.  IMPRESSION: Negative for metastatic disease.  Chronic changes as described.   Electronically Signed   By: Rolla Flatten M.D.   On: 08/12/2014 13:25     CLINICAL DATA: Intermittent gross hematuria over the past month.  EXAM: 07/09/2014 CT ABDOMEN AND PELVIS WITHOUT AND WITH CONTRAST  IMPRESSION: 1. Irregular and lobular bladder wall thickening suspicious for transitional cell carcinoma. Considerable associated pelvic and retroperitoneal adenopathy. There is mesenteric edema and edema tracking around the urinary bladder and presacral space. 2. Suspected 2 mm left mid kidney nonobstructive calculus. There is also a  5 mm bladder calculus. 3. I have taking careful density measurements of the apparent parapelvic cyst in the left kidney, and it continues to measure fluid density on all phases, accordingly I doubt that this represents tumor. However, there is some increased density in the adjacent collecting system which could be due to blood products. I do not show a filling defect aside from the parapelvic cyst to constitute evidence of transitional cell carcinoma of the left renal hilum. 4. 1.4 by 1.3 cm complex and likely enhancing lesion of the right hepatic lobe dome is technically nonspecific based on imaging. This could be further assessed with nuclear Medicine PET-CT, or hepatic protocol MRI with and  without contrast (using Eovist contrast medium, if feasible). 5. 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   Electronically Signed  By: Sherryl Barters M.D.  On: 07/09/2014 11:27   CLINICAL DATA: Bladder tumor subsequent evaluation. Patient status post transient reach pleural resection of bladder tumor. Elevation of x-ray abdomen. Concern for ureteral obstruction  EXAM: 07/18/2014 CT ABDOMEN AND PELVIS WITHOUT CONTRAST IMPRESSION:  1. Mild bilateral hydronephrosis and hydroureter is increased from prior; however no evidence of high-grade obstruction. 2. Decrease thickening of the posterior bladder wall follow-up surgery. Asymmetric thickening along the left wall. 3. Extensive local iliac lymphadenopathy and left periaortic retroperitoneal metastatic adenopathy. 4. Right middle lobe pulmonary nodule. Consider CT thorax for complete evaluation of lungs.   Electronically Signed  By: Suzy Bouchard M.D.  On: 07/18/2014 09:57  ASSESSMENT & PLAN:  Bladder cancer Pleasant 64 year old male with very good performance status who presents with advanced bladder cancer. He is a lifelong nonsmoker. He denies any history of tobacco use. He presented to Dr. Nevada Crane with hematuria, he has undergone a TURP and prostate biopsy with Dr. Jeffie Pollock. Hospital course was complicated by acute kidney injury which is resolving. 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 Imaging studies showed bulky adenopathy and also a liver lesion.  The patient's son states he has been advised the disease is advanced. He has a lot of questions regarding possible treatment. I have recommended proceeding with pet imaging to further evaluate the extent of his disease, including the liver lesion. I discussed palliative therapy for advanced bladder cancer, and advised the patient and his son that most patients tolerate treatment very well. X  His son is very concerned about his  father's decline in memory and balance. I have also recommended obtaining a head MRI. They both agree to proceed with the studies as recommended.  His hematuria has resolved since his surgical procedure. We will go ahead and obtain baseline laboratory studies today including a CBC, CMP, and iron studies. I will see him back after imaging at which time we will discuss the extent of his disease and discuss treatment options. The patient is very interested in pursuing treatment. He seems to have an understanding that it most likely will not be curative. I will see him back post imaging as detailed.    Orders Placed This Encounter  Procedures  . MR Brain W Wo Contrast    Amy, order in sys, [employer], will get precert, creat: labs drawn 07/31/14, nbs    Standing Status: Future     Number of Occurrences: 1     Standing Expiration Date: 07/31/2015    Order Specific Question:  Reason for Exam (SYMPTOM  OR DIAGNOSIS REQUIRED)    Answer:  newly diagnosed advanced bladder cancer, confusion, unsteady gait    Order Specific  Question:  Preferred imaging location?    Answer:  [facility]    Order Specific Question:  Does the patient have a pacemaker or implanted devices?    Answer:  No    Order Specific Question:  What is the patient's sedation requirement?    Answer:  No Sedation  . NM PET Image Initial (PI) Skull Base To Thigh    Standing Status: Future     Number of Occurrences: 1     Standing Expiration Date: 07/31/2015    Order Specific Question:  Reason for Exam (SYMPTOM  OR DIAGNOSIS REQUIRED)    Answer:  newly diagnosed bladder cancer, abnormality in the liver    Order Specific Question:  Preferred imaging location?    Answer:  [facility]  . CBC with Differential    Standing Status: Future     Number of Occurrences: 1     Standing Expiration Date: 07/31/2015  . Comprehensive metabolic panel    Standing Status: Future     Number of Occurrences: 1     Standing Expiration  Date: 07/31/2015  . Iron and TIBC    Standing Status: Future     Number of Occurrences: 1     Standing Expiration Date: 07/31/2015  . Ferritin    Standing Status: Future     Number of Occurrences: 1     Standing Expiration Date: 07/31/2015  . Folate    Standing Status: Future     Number of Occurrences: 1     Standing Expiration Date: 07/31/2015  . Vitamin B12    Standing Status: Future     Number of Occurrences: 1     Standing Expiration Date: 07/31/2015    All questions were answered. The patient knows to call the clinic with any problems, questions or concerns.    Molli Hazard, MD MD 08/16/2014 7:32 AM

## 2014-08-01 LAB — FOLATE: FOLATE: 5.4 ng/mL

## 2014-08-01 LAB — VITAMIN B12: VITAMIN B 12: 865 pg/mL (ref 211–911)

## 2014-08-01 LAB — IRON AND TIBC
IRON: 28 ug/dL — AB (ref 42–165)
Saturation Ratios: 10 % — ABNORMAL LOW (ref 20–55)
TIBC: 291 ug/dL (ref 215–435)
UIBC: 263 ug/dL (ref 125–400)

## 2014-08-01 LAB — FERRITIN: Ferritin: 214 ng/mL (ref 22–322)

## 2014-08-02 ENCOUNTER — Other Ambulatory Visit (HOSPITAL_COMMUNITY): Payer: Self-pay | Admitting: Hematology & Oncology

## 2014-08-02 DIAGNOSIS — D500 Iron deficiency anemia secondary to blood loss (chronic): Secondary | ICD-10-CM

## 2014-08-02 MED ORDER — FOLIC ACID 400 MCG PO TABS: 400.0000 ug | ORAL_TABLET | Freq: Every day | ORAL | Status: DC

## 2014-08-08 ENCOUNTER — Encounter (HOSPITAL_COMMUNITY)
Admission: RE | Admit: 2014-08-08 | Discharge: 2014-08-08 | Disposition: A | Discharge status: Home / Self Care | Payer: Commercial Managed Care - HMO | Source: Ambulatory Visit | Attending: Hematology & Oncology | Admitting: Hematology & Oncology

## 2014-08-08 ENCOUNTER — Encounter (HOSPITAL_COMMUNITY): Payer: Self-pay

## 2014-08-08 DIAGNOSIS — C679 Malignant neoplasm of bladder, unspecified: Secondary | ICD-10-CM

## 2014-08-08 LAB — GLUCOSE, CAPILLARY: GLUCOSE-CAPILLARY: 92 mg/dL (ref 70–99)

## 2014-08-08 MED ORDER — FLUDEOXYGLUCOSE F - 18 (FDG) INJECTION
8.9000 | Freq: Once | INTRAVENOUS | Status: AC | PRN
  Administered 2014-08-08: 8.9 via INTRAVENOUS

## 2014-08-12 ENCOUNTER — Ambulatory Visit (HOSPITAL_COMMUNITY)
Admission: RE | Admit: 2014-08-12 | Discharge: 2014-08-12 | Disposition: A | Discharge status: Home / Self Care | Payer: Commercial Managed Care - HMO | Source: Ambulatory Visit | Attending: Hematology & Oncology | Admitting: Hematology & Oncology

## 2014-08-12 DIAGNOSIS — C679 Malignant neoplasm of bladder, unspecified: Secondary | ICD-10-CM | POA: Diagnosis not present

## 2014-08-12 MED ORDER — GADOBENATE DIMEGLUMINE 529 MG/ML IV SOLN
15.0000 mL | Freq: Once | INTRAVENOUS | Status: AC | PRN
  Administered 2014-08-12: 15 mL via INTRAVENOUS

## 2014-08-13 ENCOUNTER — Encounter (HOSPITAL_COMMUNITY): Payer: Self-pay | Admitting: Hematology & Oncology

## 2014-08-13 ENCOUNTER — Encounter (HOSPITAL_COMMUNITY): Payer: Commercial Managed Care - HMO | Attending: Hematology & Oncology | Admitting: Hematology & Oncology

## 2014-08-13 VITALS — BP 131/85 | HR 73 | Temp 97.4°F | Resp 16 | Wt 163.1 lb

## 2014-08-13 DIAGNOSIS — C679 Malignant neoplasm of bladder, unspecified: Secondary | ICD-10-CM

## 2014-08-13 DIAGNOSIS — Z803 Family history of malignant neoplasm of breast: Secondary | ICD-10-CM

## 2014-08-13 NOTE — Patient Instructions (Signed)
[place] at [facility] Discharge Instructions  RECOMMENDATIONS MADE BY THE CONSULTANT AND ANY TEST RESULTS WILL BE SENT TO YOUR REFERRING PHYSICIAN.  Discussion of test results by Dr. Whitney Muse.  Will get you scheduled for port a cath placement, chemotherapy teaching and chemotherapy using Carboplatin and Gemzar.  Appointments to be arranged and you will be contacted.  Thank you for choosing [place] at [facility] to provide your oncology and hematology care.  To afford each patient quality time with our provider, please arrive at least 15 minutes before your scheduled appointment time.    You need to re-schedule your appointment should you arrive 10 or more minutes late.  We strive to give you quality time with our providers, and arriving late affects you and other patients whose appointments are after yours.  Also, if you no show three or more times for appointments you may be dismissed from the clinic at the providers discretion.     Again, thank you for choosing [facility].  Our hope is that these requests will decrease the amount of time that you wait before being seen by our physicians.       _____________________________________________________________  Should you have questions after your visit to [facility], please contact our office at [phone] between the hours of 8:30 a.m. and 4:30 p.m.  Voicemails left after 4:30 p.m. will not be returned until the following business day.  For prescription refill requests, have your pharmacy contact our office.    Chemotherapy Many people are apprehensive about chemotherapy due to concerns over uncomfortable side effects. However, managements for side effects have come a long way. Many side effects once associated with chemotherapy can be prevented and/or controlled. WHAT IS CHEMOTHERAPY? Chemotherapy is the general term for any treatment involving the use of chemical  agents. Chemotherapy can be given through a vein, most commonly through an implanted port* or PICC line.* It can also be delivered by mouth (orally) in the form of a pill. The main goal of chemotherapy is to kill cancer cells and stop them from growing. It can destroy and eliminate cancer cells where the cancer started (primary tumor location) and throughout the body, often far away from the original cancer. It is a treatment that not only targets the original cancer location, but also the entire body (systemic treatment) for full effect and results. Chemotherapy works by destroying cancer cells. Unfortunately, it cannot tell the difference between a cancer cell and some healthy cells. This results in the death of noncancerous cells, such as hair and blood cells. Harm to healthy cells is what causes side effects. These cells usually repair themselves after chemotherapy. Because some drugs work better together rather than alone, 2 or more drugs are often given at the same time. This is called combination chemotherapy. Depending on the type of cancer and how advanced it is, chemotherapy can be used for different goals:  Cure the cancer.  Keep the cancer from spreading.  Slow the cancer's growth.  Kill cancer cells that may have spread to other parts of the body from the original tumor.  Relieve symptoms caused by cancer. You and your caregiver will decide what drug or combination of drugs you will get. Your caregiver will choose the doses, how the drugs will be given, how often, and how long you will get treatment. All of these decisions will depend on the type of cancer, where it is, how big  it is, and how it is affecting your normal body functions and overall health. *Implanted port - A device that is implanted under your skin so that medicines may be delivered directly into your blood system. *PICC line (peripherally inserted central catheter) - A long, slender, flexible tube. This tube is often  inserted into a vein, typically in the upper arm. The tip stops in the large central vein that leads to your heart. Document Released: 04/04/2007 Document Revised: 08/30/2011 Document Reviewed: 09/19/2008 [facility] Patient Information 2015 [place], [place]. This information is not intended to replace advice given to you by your health care provider. Make sure you discuss any questions you have with your health care provider. Gemcitabine injection What is this medicine? GEMCITABINE (jem SIT a been) is a chemotherapy drug. This medicine is used to treat many types of cancer like breast cancer, lung cancer, pancreatic cancer, and ovarian cancer. This medicine may be used for other purposes; ask your health care provider or pharmacist if you have questions. COMMON BRAND NAME(S): Gemzar What should I tell my health care provider before I take this medicine? They need to know if you have any of these conditions: -blood disorders -infection -kidney disease -liver disease -recent or ongoing radiation therapy -an unusual or allergic reaction to gemcitabine, other chemotherapy, other medicines, foods, dyes, or preservatives -pregnant or trying to get pregnant -breast-feeding How should I use this medicine? This drug is given as an infusion into a vein. It is administered in a hospital or clinic by a specially trained health care professional. Talk to your pediatrician regarding the use of this medicine in children. Special care may be needed. Overdosage: If you think you have taken too much of this medicine contact a poison control center or emergency room at once. NOTE: This medicine is only for you. Do not share this medicine with others. What if I miss a dose? It is important not to miss your dose. Call your doctor or health care professional if you are unable to keep an appointment. What may interact with this medicine? -medicines to increase blood counts like filgrastim, pegfilgrastim,  sargramostim -some other chemotherapy drugs like cisplatin -vaccines Talk to your doctor or health care professional before taking any of these medicines: -acetaminophen -aspirin -ibuprofen -ketoprofen -naproxen This list may not describe all possible interactions. Give your health care provider a list of all the medicines, herbs, non-prescription drugs, or dietary supplements you use. Also tell them if you smoke, drink alcohol, or use illegal drugs. Some items may interact with your medicine. What should I watch for while using this medicine? Visit your doctor for checks on your progress. This drug may make you feel generally unwell. This is not uncommon, as chemotherapy can affect healthy cells as well as cancer cells. Report any side effects. Continue your course of treatment even though you feel ill unless your doctor tells you to stop. In some cases, you may be given additional medicines to help with side effects. Follow all directions for their use. Call your doctor or health care professional for advice if you get a fever, chills or sore throat, or other symptoms of a cold or flu. Do not treat yourself. This drug decreases your body's ability to fight infections. Try to avoid being around people who are sick. This medicine may increase your risk to bruise or bleed. Call your doctor or health care professional if you notice any unusual bleeding. Be careful brushing and flossing your teeth or using a toothpick because  you may get an infection or bleed more easily. If you have any dental work done, tell your dentist you are receiving this medicine. Avoid taking products that contain aspirin, acetaminophen, ibuprofen, naproxen, or ketoprofen unless instructed by your doctor. These medicines may hide a fever. Women should inform their doctor if they wish to become pregnant or think they might be pregnant. There is a potential for serious side effects to an unborn child. Talk to your health care  professional or pharmacist for more information. Do not breast-feed an infant while taking this medicine. What side effects may I notice from receiving this medicine? Side effects that you should report to your doctor or health care professional as soon as possible: -allergic reactions like skin rash, itching or hives, swelling of the face, lips, or tongue -low blood counts - this medicine may decrease the number of white blood cells, red blood cells and platelets. You may be at increased risk for infections and bleeding. -signs of infection - fever or chills, cough, sore throat, pain or difficulty passing urine -signs of decreased platelets or bleeding - bruising, pinpoint red spots on the skin, black, tarry stools, blood in the urine -signs of decreased red blood cells - unusually weak or tired, fainting spells, lightheadedness -breathing problems -chest pain -mouth sores -nausea and vomiting -pain, swelling, redness at site where injected -pain, tingling, numbness in the hands or feet -stomach pain -swelling of ankles, feet, hands -unusual bleeding Side effects that usually do not require medical attention (report to your doctor or health care professional if they continue or are bothersome): -constipation -diarrhea -hair loss -loss of appetite -stomach upset This list may not describe all possible side effects. Call your doctor for medical advice about side effects. You may report side effects to FDA at 1-800-FDA-1088. Where should I keep my medicine? This drug is given in a hospital or clinic and will not be stored at home. NOTE: This sheet is a summary. It may not cover all possible information. If you have questions about this medicine, talk to your doctor, pharmacist, or health care provider.  2015, [employer] Standard. (2007-10-17 18:45:54) Carboplatin injection What is this medicine? CARBOPLATIN (KAR boe pla tin) is a chemotherapy drug. It targets fast dividing cells, like  cancer cells, and causes these cells to die. This medicine is used to treat ovarian cancer and many other cancers. This medicine may be used for other purposes; ask your health care provider or pharmacist if you have questions. COMMON BRAND NAME(S): Paraplatin What should I tell my health care provider before I take this medicine? They need to know if you have any of these conditions: -blood disorders -hearing problems -kidney disease -recent or ongoing radiation therapy -an unusual or allergic reaction to carboplatin, cisplatin, other chemotherapy, other medicines, foods, dyes, or preservatives -pregnant or trying to get pregnant -breast-feeding How should I use this medicine? This drug is usually given as an infusion into a vein. It is administered in a hospital or clinic by a specially trained health care professional. Talk to your pediatrician regarding the use of this medicine in children. Special care may be needed. Overdosage: If you think you have taken too much of this medicine contact a poison control center or emergency room at once. NOTE: This medicine is only for you. Do not share this medicine with others. What if I miss a dose? It is important not to miss a dose. Call your doctor or health care professional if you are unable to  keep an appointment. What may interact with this medicine? -medicines for seizures -medicines to increase blood counts like filgrastim, pegfilgrastim, sargramostim -some antibiotics like amikacin, gentamicin, neomycin, streptomycin, tobramycin -vaccines Talk to your doctor or health care professional before taking any of these medicines: -acetaminophen -aspirin -ibuprofen -ketoprofen -naproxen This list may not describe all possible interactions. Give your health care provider a list of all the medicines, herbs, non-prescription drugs, or dietary supplements you use. Also tell them if you smoke, drink alcohol, or use illegal drugs. Some items may  interact with your medicine. What should I watch for while using this medicine? Your condition will be monitored carefully while you are receiving this medicine. You will need important blood work done while you are taking this medicine. This drug may make you feel generally unwell. This is not uncommon, as chemotherapy can affect healthy cells as well as cancer cells. Report any side effects. Continue your course of treatment even though you feel ill unless your doctor tells you to stop. In some cases, you may be given additional medicines to help with side effects. Follow all directions for their use. Call your doctor or health care professional for advice if you get a fever, chills or sore throat, or other symptoms of a cold or flu. Do not treat yourself. This drug decreases your body's ability to fight infections. Try to avoid being around people who are sick. This medicine may increase your risk to bruise or bleed. Call your doctor or health care professional if you notice any unusual bleeding. Be careful brushing and flossing your teeth or using a toothpick because you may get an infection or bleed more easily. If you have any dental work done, tell your dentist you are receiving this medicine. Avoid taking products that contain aspirin, acetaminophen, ibuprofen, naproxen, or ketoprofen unless instructed by your doctor. These medicines may hide a fever. Do not become pregnant while taking this medicine. Women should inform their doctor if they wish to become pregnant or think they might be pregnant. There is a potential for serious side effects to an unborn child. Talk to your health care professional or pharmacist for more information. Do not breast-feed an infant while taking this medicine. What side effects may I notice from receiving this medicine? Side effects that you should report to your doctor or health care professional as soon as possible: -allergic reactions like skin rash, itching or  hives, swelling of the face, lips, or tongue -signs of infection - fever or chills, cough, sore throat, pain or difficulty passing urine -signs of decreased platelets or bleeding - bruising, pinpoint red spots on the skin, black, tarry stools, nosebleeds -signs of decreased red blood cells - unusually weak or tired, fainting spells, lightheadedness -breathing problems -changes in hearing -changes in vision -chest pain -high blood pressure -low blood counts - This drug may decrease the number of white blood cells, red blood cells and platelets. You may be at increased risk for infections and bleeding. -nausea and vomiting -pain, swelling, redness or irritation at the injection site -pain, tingling, numbness in the hands or feet -problems with balance, talking, walking -trouble passing urine or change in the amount of urine Side effects that usually do not require medical attention (report to your doctor or health care professional if they continue or are bothersome): -hair loss -loss of appetite -metallic taste in the mouth or changes in taste This list may not describe all possible side effects. Call your doctor for medical advice about  side effects. You may report side effects to FDA at 1-800-FDA-1088. Where should I keep my medicine? This drug is given in a hospital or clinic and will not be stored at home. NOTE: This sheet is a summary. It may not cover all possible information. If you have questions about this medicine, talk to your doctor, pharmacist, or health care provider.  2015, [employer] Standard. (2007-09-12 14:38:05)

## 2014-08-14 ENCOUNTER — Other Ambulatory Visit (HOSPITAL_COMMUNITY): Payer: Self-pay | Admitting: Oncology

## 2014-08-15 ENCOUNTER — Telehealth (HOSPITAL_COMMUNITY): Payer: Self-pay | Admitting: *Deleted

## 2014-08-15 NOTE — Telephone Encounter (Signed)
I called and left a message (on [facility] vm)  for Tiffany in regards to making sure they called the son [name] to set up a port a cath appointment.

## 2014-08-16 ENCOUNTER — Ambulatory Visit (INDEPENDENT_AMBULATORY_CARE_PROVIDER_SITE_OTHER): Payer: Commercial Managed Care - HMO | Admitting: Urology

## 2014-08-16 ENCOUNTER — Encounter (HOSPITAL_COMMUNITY): Payer: Self-pay | Admitting: Hematology & Oncology

## 2014-08-16 DIAGNOSIS — C679 Malignant neoplasm of bladder, unspecified: Secondary | ICD-10-CM

## 2014-08-16 DIAGNOSIS — C772 Secondary and unspecified malignant neoplasm of intra-abdominal lymph nodes: Secondary | ICD-10-CM | POA: Diagnosis not present

## 2014-08-16 NOTE — Assessment & Plan Note (Signed)
Pleasant 64 year old male with very good performance status who presents with advanced bladder cancer. He is a lifelong nonsmoker. He denies any history of tobacco use. He presented to Dr. Nevada Crane with hematuria, he has undergone a TURP and prostate biopsy with Dr. Jeffie Pollock. Hospital course was complicated by acute kidney injury which is resolving. 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 Imaging studies showed bulky adenopathy and also a liver lesion.  The patient's son states he has been advised the disease is advanced. He has a lot of questions regarding possible treatment. I have recommended proceeding with pet imaging to further evaluate the extent of his disease, including the liver lesion. I discussed palliative therapy for advanced bladder cancer, and advised the patient and his son that most patients tolerate treatment very well. X  His son is very concerned about his father's decline in memory and balance. I have also recommended obtaining a head MRI. They both agree to proceed with the studies as recommended.  His hematuria has resolved since his surgical procedure. We will go ahead and obtain baseline laboratory studies today including a CBC, CMP, and iron studies. I will see him back after imaging at which time we will discuss the extent of his disease and discuss treatment options. The patient is very interested in pursuing treatment. He seems to have an understanding that it most likely will not be curative. I will see him back post imaging as detailed.

## 2014-08-19 ENCOUNTER — Other Ambulatory Visit: Payer: Self-pay | Admitting: Radiology

## 2014-08-19 MED ORDER — PROCHLORPERAZINE MALEATE 10 MG PO TABS: 10.0000 mg | ORAL_TABLET | Freq: Four times a day (QID) | ORAL | Status: DC | PRN

## 2014-08-19 MED ORDER — LIDOCAINE-PRILOCAINE 2.5-2.5 % EX CREA: TOPICAL_CREAM | CUTANEOUS | Status: DC

## 2014-08-19 MED ORDER — ONDANSETRON HCL 8 MG PO TABS: 8.0000 mg | ORAL_TABLET | Freq: Three times a day (TID) | ORAL | Status: DC | PRN

## 2014-08-20 ENCOUNTER — Other Ambulatory Visit (HOSPITAL_COMMUNITY): Payer: Self-pay | Admitting: Oncology

## 2014-08-20 ENCOUNTER — Other Ambulatory Visit: Payer: Self-pay | Admitting: Radiology

## 2014-08-20 ENCOUNTER — Other Ambulatory Visit (HOSPITAL_COMMUNITY): Payer: Self-pay | Admitting: Hematology & Oncology

## 2014-08-20 NOTE — Patient Instructions (Addendum)
[facility]   CHEMOTHERAPY INSTRUCTIONS  Premeds: Zofran - IV- for nausea. This is being given to prevent/reduce nausea/vomiting. Dexamethasone - IV- steroid - this is being given to decrease nausea/vomiting. Side Effects of Dexamethasone: having an increase in energy, feeling jittery/anxious, turning red in the face/neck or feeling flushed in the face/neck. This will pass after a couple of days.   (takes 30 minutes to infuse)  Carboplatin - this medication can be hard on your kidneys - this is why we need you to drink 64 oz of fluid (preferably water/decaff fluids) 2 days prior to chemo and for up to 4-5 days after chemo. Drink more if you can. This will help to keep your kidneys flushed. This can cause mild hair loss, lower your platelets (which make your blood clot), lower your white blood cells (fight infection), and cause nausea/vomiting.   (takes 30 minutes to infuse)  You will receive this medication on Day 1 of every chemo cycle.   Gemcitabine - bone marrow suppression (lowers white blood cells (fight infection), lowers red blood cells (make up your blood), lowers platelets (help blood to clot). Nausea/vomiting,fever, flu-like symptoms, rash.    (takes 30 minutes to infuse)  You will receive this medication on Day 1 & 8 of every chemo cycle   POTENTIAL SIDE EFFECTS OF TREATMENT: Increased Susceptibility to Infection, Vomiting, Constipation, Hair Thinning, Changes in Character of Skin and Nails (brittleness, dryness,etc.), Bone Marrow Suppression, Nausea, Diarrhea, Sun Sensitivity and Mouth Sores   EDUCATIONAL MATERIALS GIVEN AND REVIEWED: Chemotherapy and You Booklet Specific Instructions Sheets: Carboplatin, Gemzar, Zofran, Dexamethasone, EMLA cream, Compazine   SELF CARE ACTIVITIES WHILE ON CHEMOTHERAPY: Increase your fluid intake 48 hours prior to treatment and drink at least 2 quarts per day after treatment., No alcohol intake., No aspirin or other  medications unless approved by your oncologist., Eat foods that are light and easy to digest., Eat foods at cold or room temperature., No fried, fatty, or spicy foods immediately before or after treatment., Have teeth cleaned professionally before starting treatment. Keep dentures and partial plates clean., Use soft toothbrush and do not use mouthwashes that contain alcohol. Biotene is a good mouthwash that is available at most pharmacies or may be ordered by calling [phone]., Use warm salt water gargles (1 teaspoon salt per 1 quart warm water) before and after meals and at bedtime. Or you may rinse with 2 tablespoons of three -percent hydrogen peroxide mixed in eight ounces of water., Always use sunscreen with SPF (Sun Protection Factor) of 30 or higher., Use your nausea medication as directed to prevent nausea., Use your stool softener or laxative as directed to prevent constipation. and Use your anti-diarrheal medication as directed to stop diarrhea.  Please wash your hands for at least 30 seconds using warm soapy water. Handwashing is the #1 way to prevent the spread of germs. Stay away from sick people or people who are getting over a cold. If you develop respiratory systems such as green/yellow mucus production or productive cough or persistent cough let us know and we will see if you need an antibiotic. It is a good idea to keep a pair of gloves on when going into grocery stores/[employer] to decrease your risk of coming into contact with germs on the carts, etc. Carry alcohol hand gel with you at all times and use it frequently if out in public. All foods need to be cooked thoroughly. No raw foods. No medium or undercooked meats,  eggs. If your food is cooked medium well, it does not need to be hot pink or saturated with bloody liquid at all. Vegetables and fruits need to be washed/rinsed under the faucet with a dish detergent before being consumed. You can eat raw fruits and vegetables unless we tell  you otherwise but it would be best if you cooked them or bought frozen. Do not eat off of salad bars or hot bars unless you really trust the cleanliness of the restaurant. If you need dental work, please let Dr. Whitney Muse know before you go for your appointment so that we can coordinate the best possible time for you in regards to your chemo regimen. You need to also let your dentist know that you are actively taking chemo. We may need to do labs prior to your dental appointment. We also want your bowels moving at least every other day. If this is not happening, we need to know so that we can get you on a bowel regimen to help you go.       MEDICATIONS: You have been given prescriptions for the following medications:  Zofran/Ondansetron 8mg  tablet. Take 1 tablet every 8 hours as needed for nausea/vomiting.   Compazine/Prochlorperazine 10mg  tablet. Take 1 tablet every 6 hours as needed for nausea/vomiting.   EMLA cream. Apply a quarter size amount to port site 1 hour prior to chemo.   Over-the-Counter Meds:  Senna - this is a mild laxative used to treat mild constipation. May take 1-8 tabs by mouth daily as needed for mild constipation.  Milk of Magnesia - this is a laxative used to treat moderate to severe constipation. May take 2-4 tablespoons every 8 hours as needed. May increase to 8 tablespoons x 1 dose and if no bowel movement call the [facility].  Imodium - this is for diarrhea. Take 2 tabs after 1st loose stool and then 1 tab every 2 hours until you go a total of 12 hours without a loose stool. Call [facility] if loose stools continue.     SYMPTOMS TO REPORT AS SOON AS POSSIBLE AFTER TREATMENT:  FEVER GREATER THAN 100.5 F  CHILLS WITH OR WITHOUT FEVER  NAUSEA AND VOMITING THAT IS NOT CONTROLLED WITH YOUR NAUSEA MEDICATION  UNUSUAL SHORTNESS OF BREATH  UNUSUAL BRUISING OR BLEEDING  TENDERNESS IN MOUTH AND THROAT WITH OR WITHOUT PRESENCE OF ULCERS  URINARY  PROBLEMS  BOWEL PROBLEMS  UNUSUAL RASH    Wear comfortable clothing and clothing appropriate for easy access to any Portacath or PICC line. Let us know if there is anything that we can do to make your therapy better!      I have been informed and understand all of the instructions given to me and have received a copy. I have been instructed to call the clinic [phone] or my family physician as soon as possible for continued medical care, if indicated. I do not have any more questions at this time but understand that I may call the [facility] or the Patient Navigator at [phone] during office hours should I have questions or need assistance in obtaining follow-up care.           Carboplatin injection What is this medicine? CARBOPLATIN (KAR boe pla tin) is a chemotherapy drug. It targets fast dividing cells, like cancer cells, and causes these cells to die. This medicine is used to treat ovarian cancer and many other cancers. This medicine may be used for other purposes; ask your  health care provider or pharmacist if you have questions. COMMON BRAND NAME(S): Paraplatin What should I tell my health care provider before I take this medicine? They need to know if you have any of these conditions: -blood disorders -hearing problems -kidney disease -recent or ongoing radiation therapy -an unusual or allergic reaction to carboplatin, cisplatin, other chemotherapy, other medicines, foods, dyes, or preservatives -pregnant or trying to get pregnant -breast-feeding How should I use this medicine? This drug is usually given as an infusion into a vein. It is administered in a hospital or clinic by a specially trained health care professional. Talk to your pediatrician regarding the use of this medicine in children. Special care may be needed. Overdosage: If you think you have taken too much of this medicine contact a poison control center or emergency room at once. NOTE:  This medicine is only for you. Do not share this medicine with others. What if I miss a dose? It is important not to miss a dose. Call your doctor or health care professional if you are unable to keep an appointment. What may interact with this medicine? -medicines for seizures -medicines to increase blood counts like filgrastim, pegfilgrastim, sargramostim -some antibiotics like amikacin, gentamicin, neomycin, streptomycin, tobramycin -vaccines Talk to your doctor or health care professional before taking any of these medicines: -acetaminophen -aspirin -ibuprofen -ketoprofen -naproxen This list may not describe all possible interactions. Give your health care provider a list of all the medicines, herbs, non-prescription drugs, or dietary supplements you use. Also tell them if you smoke, drink alcohol, or use illegal drugs. Some items may interact with your medicine. What should I watch for while using this medicine? Your condition will be monitored carefully while you are receiving this medicine. You will need important blood work done while you are taking this medicine. This drug may make you feel generally unwell. This is not uncommon, as chemotherapy can affect healthy cells as well as cancer cells. Report any side effects. Continue your course of treatment even though you feel ill unless your doctor tells you to stop. In some cases, you may be given additional medicines to help with side effects. Follow all directions for their use. Call your doctor or health care professional for advice if you get a fever, chills or sore throat, or other symptoms of a cold or flu. Do not treat yourself. This drug decreases your body's ability to fight infections. Try to avoid being around people who are sick. This medicine may increase your risk to bruise or bleed. Call your doctor or health care professional if you notice any unusual bleeding. Be careful brushing and flossing your teeth or using a  toothpick because you may get an infection or bleed more easily. If you have any dental work done, tell your dentist you are receiving this medicine. Avoid taking products that contain aspirin, acetaminophen, ibuprofen, naproxen, or ketoprofen unless instructed by your doctor. These medicines may hide a fever. Do not become pregnant while taking this medicine. Women should inform their doctor if they wish to become pregnant or think they might be pregnant. There is a potential for serious side effects to an unborn child. Talk to your health care professional or pharmacist for more information. Do not breast-feed an infant while taking this medicine. What side effects may I notice from receiving this medicine? Side effects that you should report to your doctor or health care professional as soon as possible: -allergic reactions like skin rash, itching or hives, swelling of  the face, lips, or tongue -signs of infection - fever or chills, cough, sore throat, pain or difficulty passing urine -signs of decreased platelets or bleeding - bruising, pinpoint red spots on the skin, black, tarry stools, nosebleeds -signs of decreased red blood cells - unusually weak or tired, fainting spells, lightheadedness -breathing problems -changes in hearing -changes in vision -chest pain -high blood pressure -low blood counts - This drug may decrease the number of white blood cells, red blood cells and platelets. You may be at increased risk for infections and bleeding. -nausea and vomiting -pain, swelling, redness or irritation at the injection site -pain, tingling, numbness in the hands or feet -problems with balance, talking, walking -trouble passing urine or change in the amount of urine Side effects that usually do not require medical attention (report to your doctor or health care professional if they continue or are bothersome): -hair loss -loss of appetite -metallic taste in the mouth or changes in  taste This list may not describe all possible side effects. Call your doctor for medical advice about side effects. You may report side effects to FDA at 1-800-FDA-1088. Where should I keep my medicine? This drug is given in a hospital or clinic and will not be stored at home. NOTE: This sheet is a summary. It may not cover all possible information. If you have questions about this medicine, talk to your doctor, pharmacist, or health care provider.  2015, Elsevier/[employer] Standard. (2007-09-12 14:38:05) Carboplatin injection What is this medicine? CARBOPLATIN (KAR boe pla tin) is a chemotherapy drug. It targets fast dividing cells, like cancer cells, and causes these cells to die. This medicine is used to treat ovarian cancer and many other cancers. This medicine may be used for other purposes; ask your health care provider or pharmacist if you have questions. COMMON BRAND NAME(S): Paraplatin What should I tell my health care provider before I take this medicine? They need to know if you have any of these conditions: -blood disorders -hearing problems -kidney disease -recent or ongoing radiation therapy -an unusual or allergic reaction to carboplatin, cisplatin, other chemotherapy, other medicines, foods, dyes, or preservatives -pregnant or trying to get pregnant -breast-feeding How should I use this medicine? This drug is usually given as an infusion into a vein. It is administered in a hospital or clinic by a specially trained health care professional. Talk to your pediatrician regarding the use of this medicine in children. Special care may be needed. Overdosage: If you think you have taken too much of this medicine contact a poison control center or emergency room at once. NOTE: This medicine is only for you. Do not share this medicine with others. What if I miss a dose? It is important not to miss a dose. Call your doctor or health care professional if you are unable to keep an  appointment. What may interact with this medicine? -medicines for seizures -medicines to increase blood counts like filgrastim, pegfilgrastim, sargramostim -some antibiotics like amikacin, gentamicin, neomycin, streptomycin, tobramycin -vaccines Talk to your doctor or health care professional before taking any of these medicines: -acetaminophen -aspirin -ibuprofen -ketoprofen -naproxen This list may not describe all possible interactions. Give your health care provider a list of all the medicines, herbs, non-prescription drugs, or dietary supplements you use. Also tell them if you smoke, drink alcohol, or use illegal drugs. Some items may interact with your medicine. What should I watch for while using this medicine? Your condition will be monitored carefully while you are receiving this  medicine. You will need important blood work done while you are taking this medicine. This drug may make you feel generally unwell. This is not uncommon, as chemotherapy can affect healthy cells as well as cancer cells. Report any side effects. Continue your course of treatment even though you feel ill unless your doctor tells you to stop. In some cases, you may be given additional medicines to help with side effects. Follow all directions for their use. Call your doctor or health care professional for advice if you get a fever, chills or sore throat, or other symptoms of a cold or flu. Do not treat yourself. This drug decreases your body's ability to fight infections. Try to avoid being around people who are sick. This medicine may increase your risk to bruise or bleed. Call your doctor or health care professional if you notice any unusual bleeding. Be careful brushing and flossing your teeth or using a toothpick because you may get an infection or bleed more easily. If you have any dental work done, tell your dentist you are receiving this medicine. Avoid taking products that contain aspirin, acetaminophen,  ibuprofen, naproxen, or ketoprofen unless instructed by your doctor. These medicines may hide a fever. Do not become pregnant while taking this medicine. Women should inform their doctor if they wish to become pregnant or think they might be pregnant. There is a potential for serious side effects to an unborn child. Talk to your health care professional or pharmacist for more information. Do not breast-feed an infant while taking this medicine. What side effects may I notice from receiving this medicine? Side effects that you should report to your doctor or health care professional as soon as possible: -allergic reactions like skin rash, itching or hives, swelling of the face, lips, or tongue -signs of infection - fever or chills, cough, sore throat, pain or difficulty passing urine -signs of decreased platelets or bleeding - bruising, pinpoint red spots on the skin, black, tarry stools, nosebleeds -signs of decreased red blood cells - unusually weak or tired, fainting spells, lightheadedness -breathing problems -changes in hearing -changes in vision -chest pain -high blood pressure -low blood counts - This drug may decrease the number of white blood cells, red blood cells and platelets. You may be at increased risk for infections and bleeding. -nausea and vomiting -pain, swelling, redness or irritation at the injection site -pain, tingling, numbness in the hands or feet -problems with balance, talking, walking -trouble passing urine or change in the amount of urine Side effects that usually do not require medical attention (report to your doctor or health care professional if they continue or are bothersome): -hair loss -loss of appetite -metallic taste in the mouth or changes in taste This list may not describe all possible side effects. Call your doctor for medical advice about side effects. You may report side effects to FDA at 1-800-FDA-1088. Where should I keep my medicine? This drug  is given in a hospital or clinic and will not be stored at home. NOTE: This sheet is a summary. It may not cover all possible information. If you have questions about this medicine, talk to your doctor, pharmacist, or health care provider.  2015, [employer]/Gold Standard. (2007-09-12 14:38:05) Gemcitabine injection What is this medicine? GEMCITABINE (jem SIT a been) is a chemotherapy drug. This medicine is used to treat many types of cancer like breast cancer, lung cancer, pancreatic cancer, and ovarian cancer. This medicine may be used for other purposes; ask your health care  provider or pharmacist if you have questions. COMMON BRAND NAME(S): Gemzar What should I tell my health care provider before I take this medicine? They need to know if you have any of these conditions: -blood disorders -infection -kidney disease -liver disease -recent or ongoing radiation therapy -an unusual or allergic reaction to gemcitabine, other chemotherapy, other medicines, foods, dyes, or preservatives -pregnant or trying to get pregnant -breast-feeding How should I use this medicine? This drug is given as an infusion into a vein. It is administered in a hospital or clinic by a specially trained health care professional. Talk to your pediatrician regarding the use of this medicine in children. Special care may be needed. Overdosage: If you think you have taken too much of this medicine contact a poison control center or emergency room at once. NOTE: This medicine is only for you. Do not share this medicine with others. What if I miss a dose? It is important not to miss your dose. Call your doctor or health care professional if you are unable to keep an appointment. What may interact with this medicine? -medicines to increase blood counts like filgrastim, pegfilgrastim, sargramostim -some other chemotherapy drugs like cisplatin -vaccines Talk to your doctor or health care professional before taking any of  these medicines: -acetaminophen -aspirin -ibuprofen -ketoprofen -naproxen This list may not describe all possible interactions. Give your health care provider a list of all the medicines, herbs, non-prescription drugs, or dietary supplements you use. Also tell them if you smoke, drink alcohol, or use illegal drugs. Some items may interact with your medicine. What should I watch for while using this medicine? Visit your doctor for checks on your progress. This drug may make you feel generally unwell. This is not uncommon, as chemotherapy can affect healthy cells as well as cancer cells. Report any side effects. Continue your course of treatment even though you feel ill unless your doctor tells you to stop. In some cases, you may be given additional medicines to help with side effects. Follow all directions for their use. Call your doctor or health care professional for advice if you get a fever, chills or sore throat, or other symptoms of a cold or flu. Do not treat yourself. This drug decreases your body's ability to fight infections. Try to avoid being around people who are sick. This medicine may increase your risk to bruise or bleed. Call your doctor or health care professional if you notice any unusual bleeding. Be careful brushing and flossing your teeth or using a toothpick because you may get an infection or bleed more easily. If you have any dental work done, tell your dentist you are receiving this medicine. Avoid taking products that contain aspirin, acetaminophen, ibuprofen, naproxen, or ketoprofen unless instructed by your doctor. These medicines may hide a fever. Women should inform their doctor if they wish to become pregnant or think they might be pregnant. There is a potential for serious side effects to an unborn child. Talk to your health care professional or pharmacist for more information. Do not breast-feed an infant while taking this medicine. What side effects may I notice from  receiving this medicine? Side effects that you should report to your doctor or health care professional as soon as possible: -allergic reactions like skin rash, itching or hives, swelling of the face, lips, or tongue -low blood counts - this medicine may decrease the number of white blood cells, red blood cells and platelets. You may be at increased risk for infections and  bleeding. -signs of infection - fever or chills, cough, sore throat, pain or difficulty passing urine -signs of decreased platelets or bleeding - bruising, pinpoint red spots on the skin, black, tarry stools, blood in the urine -signs of decreased red blood cells - unusually weak or tired, fainting spells, lightheadedness -breathing problems -chest pain -mouth sores -nausea and vomiting -pain, swelling, redness at site where injected -pain, tingling, numbness in the hands or feet -stomach pain -swelling of ankles, feet, hands -unusual bleeding Side effects that usually do not require medical attention (report to your doctor or health care professional if they continue or are bothersome): -constipation -diarrhea -hair loss -loss of appetite -stomach upset This list may not describe all possible side effects. Call your doctor for medical advice about side effects. You may report side effects to FDA at 1-800-FDA-1088. Where should I keep my medicine? This drug is given in a hospital or clinic and will not be stored at home. NOTE: This sheet is a summary. It may not cover all possible information. If you have questions about this medicine, talk to your doctor, pharmacist, or health care provider.  2015, [employer] Standard. (2007-10-17 18:45:54) Ondansetron injection What is this medicine? ONDANSETRON (on DAN se tron) is used to treat nausea and vomiting caused by chemotherapy. It is also used to prevent or treat nausea and vomiting after surgery. This medicine may be used for other purposes; ask your health care  provider or pharmacist if you have questions. COMMON BRAND NAME(S): Zofran What should I tell my health care provider before I take this medicine? They need to know if you have any of these conditions: -heart disease -history of irregular heartbeat -liver disease -low levels of magnesium or potassium in the blood -an unusual or allergic reaction to ondansetron, granisetron, other medicines, foods, dyes, or preservatives -pregnant or trying to get pregnant -breast-feeding How should I use this medicine? This medicine is for infusion into a vein. It is given by a health care professional in a hospital or clinic setting. Talk to your pediatrician regarding the use of this medicine in children. Special care may be needed. Overdosage: If you think you have taken too much of this medicine contact a poison control center or emergency room at once. NOTE: This medicine is only for you. Do not share this medicine with others. What if I miss a dose? This does not apply. What may interact with this medicine? Do not take this medicine with any of the following medications: -apomorphine -certain medicines for fungal infections like fluconazole, itraconazole, ketoconazole, posaconazole, voriconazole -cisapride -dofetilide -dronedarone -pimozide -thioridazine -ziprasidone This medicine may also interact with the following medications: -carbamazepine -certain medicines for depression, anxiety, or psychotic disturbances -fentanyl -linezolid -MAOIs like Carbex, Eldepryl, Marplan, Nardil, and Parnate -methylene blue (injected into a vein) -other medicines that prolong the QT interval (cause an abnormal heart rhythm) -phenytoin -rifampicin -tramadol This list may not describe all possible interactions. Give your health care provider a list of all the medicines, herbs, non-prescription drugs, or dietary supplements you use. Also tell them if you smoke, drink alcohol, or use illegal drugs. Some items  may interact with your medicine. What should I watch for while using this medicine? Your condition will be monitored carefully while you are receiving this medicine. What side effects may I notice from receiving this medicine? Side effects that you should report to your doctor or health care professional as soon as possible: -allergic reactions like skin rash, itching or hives, swelling of  the face, lips, or tongue -breathing problems -confusion -dizziness -fast or irregular heartbeat -feeling faint or lightheaded, falls -fever and chills -loss of balance or coordination -seizures -sweating -swelling of the hands and feet -tightness in the chest -tremors -unusually weak or tired Side effects that usually do not require medical attention (report to your doctor or health care professional if they continue or are bothersome): -constipation or diarrhea -headache This list may not describe all possible side effects. Call your doctor for medical advice about side effects. You may report side effects to FDA at 1-800-FDA-1088. Where should I keep my medicine? This drug is given in a hospital or clinic and will not be stored at home. NOTE: This sheet is a summary. It may not cover all possible information. If you have questions about this medicine, talk to your doctor, pharmacist, or health care provider.  2015, [employer]. (2013-03-14 16:18:28) Dexamethasone injection What is this medicine? DEXAMETHASONE (dex a METH a sone) is a corticosteroid. It is used to treat inflammation of the skin, joints, lungs, and other organs. Common conditions treated include asthma, allergies, and arthritis. It is also used for other conditions, like blood disorders and diseases of the adrenal glands. This medicine may be used for other purposes; ask your health care provider or pharmacist if you have questions. COMMON BRAND NAME(S): Decadron, Solurex What should I tell my health care provider  before I take this medicine? They need to know if you have any of these conditions: -blood clotting problems -Cushing's syndrome -diabetes -glaucoma -heart problems or disease -high blood pressure -infection like herpes, measles, tuberculosis, or chickenpox -kidney disease -liver disease -mental problems -myasthenia gravis -osteoporosis -previous heart attack -seizures -stomach, ulcer or intestine disease including colitis and diverticulitis -thyroid problem -an unusual or allergic reaction to dexamethasone, corticosteroids, other medicines, lactose, foods, dyes, or preservatives -pregnant or trying to get pregnant -breast-feeding How should I use this medicine? This medicine is for injection into a muscle, joint, lesion, soft tissue, or vein. It is given by a health care professional in a hospital or clinic setting. Talk to your pediatrician regarding the use of this medicine in children. Special care may be needed. Overdosage: If you think you have taken too much of this medicine contact a poison control center or emergency room at once. NOTE: This medicine is only for you. Do not share this medicine with others. What if I miss a dose? This may not apply. If you are having a series of injections over a prolonged period, try not to miss an appointment. Call your doctor or health care professional to reschedule if you are unable to keep an appointment. What may interact with this medicine? Do not take this medicine with any of the following medications: -mifepristone, RU-486 -vaccines This medicine may also interact with the following medications: -amphotericin B -antibiotics like clarithromycin, erythromycin, and troleandomycin -aspirin and aspirin-like drugs -barbiturates like phenobarbital -carbamazepine -cholestyramine -cholinesterase inhibitors like donepezil, galantamine, rivastigmine, and tacrine -cyclosporine -digoxin -diuretics -ephedrine -male hormones, like  estrogens or progestins and birth control pills -indinavir -isoniazid -ketoconazole -medicines for diabetes -medicines that improve muscle tone or strength for conditions like myasthenia gravis -NSAIDs, medicines for pain and inflammation, like ibuprofen or naproxen -phenytoin -rifampin -thalidomide -warfarin This list may not describe all possible interactions. Give your health care provider a list of all the medicines, herbs, non-prescription drugs, or dietary supplements you use. Also tell them if you smoke, drink alcohol, or use illegal drugs. Some items may interact with  your medicine. What should I watch for while using this medicine? Your condition will be monitored carefully while you are receiving this medicine. If you are taking this medicine for a long time, carry an identification card with your name and address, the type and dose of your medicine, and your doctor's name and address. This medicine may increase your risk of getting an infection. Stay away from people who are sick. Tell your doctor or health care professional if you are around anyone with measles or chickenpox. Talk to your health care provider before you get any vaccines that you take this medicine. If you are going to have surgery, tell your doctor or health care professional that you have taken this medicine within the last twelve months. Ask your doctor or health care professional about your diet. You may need to lower the amount of salt you eat. The medicine can increase your blood sugar. If you are a diabetic check with your doctor if you need help adjusting the dose of your diabetic medicine. What side effects may I notice from receiving this medicine? Side effects that you should report to your doctor or health care professional as soon as possible: -allergic reactions like skin rash, itching or hives, swelling of the face, lips, or tongue -black or tarry stools -change in the amount of urine -changes in  vision -confusion, excitement, restlessness, a false sense of well-being -fever, sore throat, sneezing, cough, or other signs of infection, wounds that will not heal -hallucinations -increased thirst -mental depression, mood swings, mistaken feelings of self importance or of being mistreated -pain in hips, back, ribs, arms, shoulders, or legs -pain, redness, or irritation at the injection site -redness, blistering, peeling or loosening of the skin, including inside the mouth -rounding out of face -swelling of feet or lower legs -unusual bleeding or bruising -unusual tired or weak -wounds that do not heal Side effects that usually do not require medical attention (report to your doctor or health care professional if they continue or are bothersome): -diarrhea or constipation -change in taste -headache -nausea, vomiting -skin problems, acne, thin and shiny skin -touble sleeping -unusual growth of hair on the face or body -weight gain This list may not describe all possible side effects. Call your doctor for medical advice about side effects. You may report side effects to FDA at 1-800-FDA-1088. Where should I keep my medicine? This drug is given in a hospital or clinic and will not be stored at home. NOTE: This sheet is a summary. It may not cover all possible information. If you have questions about this medicine, talk to your doctor, pharmacist, or health care provider.  2015, [employer]. (2007-09-28 14:04:12) Lidocaine; Prilocaine cream What is this medicine? LIDOCAINE; PRILOCAINE (LYE doe kane; PRIL oh kane) is a topical anesthetic that causes loss of feeling in the skin and surrounding tissues. It is used to numb the skin before procedures or injections. This medicine may be used for other purposes; ask your health care provider or pharmacist if you have questions. COMMON BRAND NAME(S): EMLA What should I tell my health care provider before I take this medicine? They  need to know if you have any of these conditions: -glucose-6-phosphate deficiencies -heart disease -kidney or liver disease -methemoglobinemia -an unusual or allergic reaction to lidocaine, prilocaine, other medicines, foods, dyes, or preservatives -pregnant or trying to get pregnant -breast-feeding How should I use this medicine? This medicine is for external use only on the skin. Do not take by mouth.  Follow the directions on the prescription label. Wash hands before and after use. Do not use more or leave in contact with the skin longer than directed. Do not apply to eyes or open wounds. It can cause irritation and blurred or temporary loss of vision. If this medicine comes in contact with your eyes, immediately rinse the eye with water. Do not touch or rub the eye. Contact your health care provider right away. Talk to your pediatrician regarding the use of this medicine in children. While this medicine may be prescribed for children for selected conditions, precautions do apply. Overdosage: If you think you have taken too much of this medicine contact a poison control center or emergency room at once. NOTE: This medicine is only for you. Do not share this medicine with others. What if I miss a dose? This medicine is usually only applied once prior to each procedure. It must be in contact with the skin for a period of time for it to work. If you applied this medicine later than directed, tell your health care professional before starting the procedure. What may interact with this medicine? -acetaminophen -chloroquine -dapsone -medicines to control heart rhythm -nitrates like nitroglycerin and nitroprusside -other ointments, creams, or sprays that may contain anesthetic medicine -phenobarbital -phenytoin -quinine -sulfonamides like sulfacetamide, sulfamethoxazole, sulfasalazine and others This list may not describe all possible interactions. Give your health care provider a list of all  the medicines, herbs, non-prescription drugs, or dietary supplements you use. Also tell them if you smoke, drink alcohol, or use illegal drugs. Some items may interact with your medicine. What should I watch for while using this medicine? Be careful to avoid injury to the treated area while it is numb and you are not aware of pain. Avoid scratching, rubbing, or exposing the treated area to hot or cold temperatures until complete sensation has returned. The numb feeling will wear off a few hours after applying the cream. What side effects may I notice from receiving this medicine? Side effects that you should report to your doctor or health care professional as soon as possible: -blurred vision -chest pain -difficulty breathing -dizziness -drowsiness -fast or irregular heartbeat -skin rash or itching -swelling of your throat, lips, or face -trembling Side effects that usually do not require medical attention (report to your doctor or health care professional if they continue or are bothersome): -changes in ability to feel hot or cold -redness and swelling at the application site This list may not describe all possible side effects. Call your doctor for medical advice about side effects. You may report side effects to FDA at 1-800-FDA-1088. Where should I keep my medicine? Keep out of reach of children. Store at room temperature between 15 and 30 degrees C (59 and 86 degrees F). Keep container tightly closed. Throw away any unused medicine after the expiration date. NOTE: This sheet is a summary. It may not cover all possible information. If you have questions about this medicine, talk to your doctor, pharmacist, or health care provider.  2015, [employer]. (2007-12-11 17:14:35) Prochlorperazine tablets What is this medicine? PROCHLORPERAZINE (proe klor PER a zeen) helps to control severe nausea and vomiting. This medicine is also used to treat schizophrenia. It can also help  patients who experience anxiety that is not due to psychological illness. This medicine may be used for other purposes; ask your health care provider or pharmacist if you have questions. COMMON BRAND NAME(S): Compazine What should I tell my health care provider before  I take this medicine? They need to know if you have any of these conditions: -blood disorders or disease -dementia -liver disease or jaundice -Parkinson's disease -uncontrollable movement disorder -an unusual or allergic reaction to prochlorperazine, other medicines, foods, dyes, or preservatives -pregnant or trying to get pregnant -breast-feeding How should I use this medicine? Take this medicine by mouth with a glass of water. Follow the directions on the prescription label. Take your doses at regular intervals. Do not take your medicine more often than directed. Do not stop taking this medicine suddenly. This can cause nausea, vomiting, and dizziness. Ask your doctor or health care professional for advice. Talk to your pediatrician regarding the use of this medicine in children. Special care may be needed. While this drug may be prescribed for children as young as 2 years for selected conditions, precautions do apply. Overdosage: If you think you have taken too much of this medicine contact a poison control center or emergency room at once. NOTE: This medicine is only for you. Do not share this medicine with others. What if I miss a dose? If you miss a dose, take it as soon as you can. If it is almost time for your next dose, take only that dose. Do not take double or extra doses. What may interact with this medicine? Do not take this medicine with any of the following medications: -amoxapine -antidepressants like citalopram, escitalopram, fluoxetine, paroxetine, and sertraline -deferoxamine -dofetilide -maprotiline -tricyclic antidepressants like amitriptyline, clomipramine, imipramine, nortiptyline and others This  medicine may also interact with the following medications: -lithium -medicines for pain -phenytoin -propranolol -warfarin This list may not describe all possible interactions. Give your health care provider a list of all the medicines, herbs, non-prescription drugs, or dietary supplements you use. Also tell them if you smoke, drink alcohol, or use illegal drugs. Some items may interact with your medicine. What should I watch for while using this medicine? Visit your doctor or health care professional for regular checks on your progress. You may get drowsy or dizzy. Do not drive, use machinery, or do anything that needs mental alertness until you know how this medicine affects you. Do not stand or sit up quickly, especially if you are an older patient. This reduces the risk of dizzy or fainting spells. Alcohol may interfere with the effect of this medicine. Avoid alcoholic drinks. This medicine can reduce the response of your body to heat or cold. Dress warm in cold weather and stay hydrated in hot weather. If possible, avoid extreme temperatures like saunas, hot tubs, very hot or cold showers, or activities that can cause dehydration such as vigorous exercise. This medicine can make you more sensitive to the sun. Keep out of the sun. If you cannot avoid being in the sun, wear protective clothing and use sunscreen. Do not use sun lamps or tanning beds/booths. Your mouth may get dry. Chewing sugarless gum or sucking hard candy, and drinking plenty of water may help. Contact your doctor if the problem does not go away or is severe. What side effects may I notice from receiving this medicine? Side effects that you should report to your doctor or health care professional as soon as possible: -blurred vision -breast enlargement in men or women -breast milk in women who are not breast-feeding -chest pain, fast or irregular heartbeat -confusion, restlessness -dark yellow or brown urine -difficulty  breathing or swallowing -dizziness or fainting spells -drooling, shaking, movement difficulty (shuffling walk) or rigidity -fever, chills, sore throat -involuntary or  uncontrollable movements of the eyes, mouth, head, arms, and legs -seizures -stomach area pain -unusually weak or tired -unusual bleeding or bruising -yellowing of skin or eyes Side effects that usually do not require medical attention (report to your doctor or health care professional if they continue or are bothersome): -difficulty passing urine -difficulty sleeping -headache -sexual dysfunction -skin rash, or itching This list may not describe all possible side effects. Call your doctor for medical advice about side effects. You may report side effects to FDA at 1-800-FDA-1088. Where should I keep my medicine? Keep out of the reach of children. Store at room temperature between 15 and 30 degrees C (59 and 86 degrees F). Protect from light. Throw away any unused medicine after the expiration date. NOTE: This sheet is a summary. It may not cover all possible information. If you have questions about this medicine, talk to your doctor, pharmacist, or health care provider.  2015, [employer]. (2011-10-26 16:59:39) Ondansetron tablets What is this medicine? ONDANSETRON (on DAN se tron) is used to treat nausea and vomiting caused by chemotherapy. It is also used to prevent or treat nausea and vomiting after surgery. This medicine may be used for other purposes; ask your health care provider or pharmacist if you have questions. COMMON BRAND NAME(S): Zofran What should I tell my health care provider before I take this medicine? They need to know if you have any of these conditions: -heart disease -history of irregular heartbeat -liver disease -low levels of magnesium or potassium in the blood -an unusual or allergic reaction to ondansetron, granisetron, other medicines, foods, dyes, or preservatives -pregnant or  trying to get pregnant -breast-feeding How should I use this medicine? Take this medicine by mouth with a glass of water. Follow the directions on your prescription label. Take your doses at regular intervals. Do not take your medicine more often than directed. Talk to your pediatrician regarding the use of this medicine in children. Special care may be needed. Overdosage: If you think you have taken too much of this medicine contact a poison control center or emergency room at once. NOTE: This medicine is only for you. Do not share this medicine with others. What if I miss a dose? If you miss a dose, take it as soon as you can. If it is almost time for your next dose, take only that dose. Do not take double or extra doses. What may interact with this medicine? Do not take this medicine with any of the following medications: -apomorphine -certain medicines for fungal infections like fluconazole, itraconazole, ketoconazole, posaconazole, voriconazole -cisapride -dofetilide -dronedarone -pimozide -thioridazine -ziprasidone This medicine may also interact with the following medications: -carbamazepine -certain medicines for depression, anxiety, or psychotic disturbances -fentanyl -linezolid -MAOIs like Carbex, Eldepryl, Marplan, Nardil, and Parnate -methylene blue (injected into a vein) -other medicines that prolong the QT interval (cause an abnormal heart rhythm) -phenytoin -rifampicin -tramadol This list may not describe all possible interactions. Give your health care provider a list of all the medicines, herbs, non-prescription drugs, or dietary supplements you use. Also tell them if you smoke, drink alcohol, or use illegal drugs. Some items may interact with your medicine. What should I watch for while using this medicine? Check with your doctor or health care professional right away if you have any sign of an allergic reaction. What side effects may I notice from receiving this  medicine? Side effects that you should report to your doctor or health care professional as soon as possible: -allergic  reactions like skin rash, itching or hives, swelling of the face, lips or tongue -breathing problems -confusion -dizziness -fast or irregular heartbeat -feeling faint or lightheaded, falls -fever and chills -loss of balance or coordination -seizures -sweating -swelling of the hands or feet -tightness in the chest -tremors -unusually weak or tired Side effects that usually do not require medical attention (report to your doctor or health care professional if they continue or are bothersome): -constipation or diarrhea -headache This list may not describe all possible side effects. Call your doctor for medical advice about side effects. You may report side effects to FDA at 1-800-FDA-1088. Where should I keep my medicine? Keep out of the reach of children. Store between 2 and 30 degrees C (36 and 86 degrees F). Throw away any unused medicine after the expiration date. NOTE: This sheet is a summary. It may not cover all possible information. If you have questions about this medicine, talk to your doctor, pharmacist, or health care provider.  2015, [employer]. (2013-03-14 16:27:45)

## 2014-08-21 ENCOUNTER — Encounter (HOSPITAL_COMMUNITY): Payer: Self-pay

## 2014-08-21 ENCOUNTER — Other Ambulatory Visit (HOSPITAL_COMMUNITY): Payer: Self-pay | Admitting: Hematology & Oncology

## 2014-08-21 ENCOUNTER — Ambulatory Visit (HOSPITAL_COMMUNITY)
Admission: RE | Admit: 2014-08-21 | Discharge: 2014-08-21 | Disposition: A | Discharge status: Home / Self Care | Payer: Commercial Managed Care - HMO | Source: Ambulatory Visit | Attending: Hematology & Oncology | Admitting: Hematology & Oncology

## 2014-08-21 ENCOUNTER — Ambulatory Visit (HOSPITAL_COMMUNITY)
Admission: RE | Admit: 2014-08-21 | Discharge: 2014-08-21 | Disposition: A | Discharge status: Home / Self Care | Payer: Commercial Managed Care - HMO | Source: Ambulatory Visit | Attending: Interventional Radiology | Admitting: Interventional Radiology

## 2014-08-21 DIAGNOSIS — C799 Secondary malignant neoplasm of unspecified site: Secondary | ICD-10-CM | POA: Diagnosis not present

## 2014-08-21 DIAGNOSIS — F329 Major depressive disorder, single episode, unspecified: Secondary | ICD-10-CM | POA: Insufficient documentation

## 2014-08-21 DIAGNOSIS — E780 Pure hypercholesterolemia: Secondary | ICD-10-CM | POA: Insufficient documentation

## 2014-08-21 DIAGNOSIS — C679 Malignant neoplasm of bladder, unspecified: Secondary | ICD-10-CM

## 2014-08-21 DIAGNOSIS — Z79899 Other long term (current) drug therapy: Secondary | ICD-10-CM | POA: Insufficient documentation

## 2014-08-21 LAB — CBC
HCT: 45.8 % (ref 39.0–52.0)
Hemoglobin: 15.1 g/dL (ref 13.0–17.0)
MCH: 29.2 pg (ref 26.0–34.0)
MCHC: 33 g/dL (ref 30.0–36.0)
MCV: 88.6 fL (ref 78.0–100.0)
PLATELETS: 153 10*3/uL (ref 150–400)
RBC: 5.17 MIL/uL (ref 4.22–5.81)
RDW: 13.9 % (ref 11.5–15.5)
WBC: 7.3 10*3/uL (ref 4.0–10.5)

## 2014-08-21 LAB — BASIC METABOLIC PANEL
Anion gap: 6 (ref 5–15)
BUN: 21 mg/dL (ref 6–23)
CHLORIDE: 103 mmol/L (ref 96–112)
CO2: 28 mmol/L (ref 19–32)
CREATININE: 1.37 mg/dL — AB (ref 0.50–1.35)
Calcium: 9 mg/dL (ref 8.4–10.5)
GFR calc Af Amer: 63 mL/min — ABNORMAL LOW (ref >90)
GFR, EST NON AFRICAN AMERICAN: 54 mL/min — AB (ref >90)
Glucose, Bld: 105 mg/dL — ABNORMAL HIGH (ref 70–99)
Potassium: 4.1 mmol/L (ref 3.5–5.1)
SODIUM: 137 mmol/L (ref 135–145)

## 2014-08-21 LAB — APTT: aPTT: 30 seconds (ref 24–37)

## 2014-08-21 LAB — PROTIME-INR
INR: 0.91 (ref 0.00–1.49)
PROTHROMBIN TIME: 12.3 s (ref 11.6–15.2)

## 2014-08-21 MED ORDER — MIDAZOLAM HCL 2 MG/2ML IJ SOLN
INTRAMUSCULAR | Status: AC
  Filled 2014-08-21: qty 6

## 2014-08-21 MED ORDER — HEPARIN SOD (PORK) LOCK FLUSH 100 UNIT/ML IV SOLN
INTRAVENOUS | Status: AC | PRN
  Administered 2014-08-21: 500 [IU]

## 2014-08-21 MED ORDER — CEFAZOLIN SODIUM-DEXTROSE 2-3 GM-% IV SOLR
INTRAVENOUS | Status: AC
  Filled 2014-08-21: qty 50

## 2014-08-21 MED ORDER — FENTANYL CITRATE 0.05 MG/ML IJ SOLN
INTRAMUSCULAR | Status: AC | PRN
  Administered 2014-08-21: 25 ug via INTRAVENOUS
  Administered 2014-08-21: 50 ug via INTRAVENOUS

## 2014-08-21 MED ORDER — SODIUM CHLORIDE 0.9 % IV SOLN
Freq: Once | INTRAVENOUS | Status: AC
  Administered 2014-08-21: 500 mL via INTRAVENOUS

## 2014-08-21 MED ORDER — MIDAZOLAM HCL 2 MG/2ML IJ SOLN
INTRAMUSCULAR | Status: AC | PRN
  Administered 2014-08-21 (×2): 1 mg via INTRAVENOUS

## 2014-08-21 MED ORDER — LIDOCAINE HCL 1 % IJ SOLN
INTRAMUSCULAR | Status: AC
  Filled 2014-08-21: qty 20

## 2014-08-21 MED ORDER — CEFAZOLIN SODIUM-DEXTROSE 2-3 GM-% IV SOLR
2.0000 g | Freq: Once | INTRAVENOUS | Status: AC
  Administered 2014-08-21: 2 g via INTRAVENOUS

## 2014-08-21 MED ORDER — FENTANYL CITRATE 0.05 MG/ML IJ SOLN
INTRAMUSCULAR | Status: AC
  Filled 2014-08-21: qty 4

## 2014-08-21 MED ORDER — LIDOCAINE-EPINEPHRINE 2 %-1:100000 IJ SOLN
INTRAMUSCULAR | Status: DC
Start: 2014-08-21 — End: 2014-08-22
  Filled 2014-08-21: qty 1

## 2014-08-21 MED ORDER — HEPARIN SOD (PORK) LOCK FLUSH 100 UNIT/ML IV SOLN
INTRAVENOUS | Status: AC
  Filled 2014-08-21: qty 5

## 2014-08-21 NOTE — H&P (Signed)
Chief Complaint: "I'm here for Port-A-Cath"  Referring Physician(s): Penland,Shannon Kristen  History of Present Illness: Jesus [name] is a 64 y.o. male with history of recently diagnosed metastatic bladder cancer who presents today for Port-A-Cath placement for chemotherapy.  Past Medical History  Diagnosis Date  . High cholesterol   . Depression     hx of   . Arthritis   . Bladder cancer     Past Surgical History  Procedure Laterality Date  . Knee surgery    . Tonsillectomy    . Transurethral resection of bladder tumor with gyrus (turbt-gyrus) N/A 07/16/2014    Procedure: TRANSURETHRAL RESECTION OF BLADDER TUMOR WITH GYRUS (TURBT-GYRUS);  Surgeon: Malka So, MD;  Location: [facility];  Service: Urology;  Laterality: N/A;  . Cystoscopy with biopsy N/A 07/16/2014    Procedure: CYSTOSCOPY WITH PROSTATE ULTRASOUND AND BIOPSY;  Surgeon: Malka So, MD;  Location: [facility];  Service: Urology;  Laterality: N/A;    Allergies: Review of patient's allergies indicates no known allergies.  Medications: Prior to Admission medications   Medication Sig Start Date End Date Taking? Authorizing Provider  folic acid (V-R FOLIC ACID) 762 MCG tablet Take 1 tablet (400 mcg total) by mouth daily. 08/02/14  Yes Molli Hazard, MD  CARBOPLATIN IV Inject into the vein. Starting 08/23/14: Day 1 every 21 days    Historical Provider, MD  Gemcitabine HCl (GEMZAR IV) Inject into the vein. Starting 08/23/14: Day 1 & 8 every 21 days    Historical Provider, MD  lidocaine-prilocaine (EMLA) cream Apply a quarter size amount to port site 1 hour prior to chemo. Do not rub in. Cover with plastic wrap. Patient not taking: Reported on 08/21/2014 08/19/14   Molli Hazard, MD  ondansetron (ZOFRAN) 8 MG tablet Take 1 tablet (8 mg total) by mouth every 8 (eight) hours as needed for nausea or vomiting. Patient not taking: Reported on 08/21/2014 08/19/14   Molli Hazard, MD  prochlorperazine  (COMPAZINE) 10 MG tablet Take 1 tablet (10 mg total) by mouth every 6 (six) hours as needed for nausea or vomiting. Patient not taking: Reported on 08/21/2014 08/19/14   Molli Hazard, MD    Family History  Problem Relation Age of Onset  . Cancer Mother   . Stroke Father   . Cancer Sister     History   Social History  . Marital Status: Single    Spouse Name: N/A  . Number of Children: N/A  . Years of Education: N/A   Social History Main Topics  . Smoking status: Never Smoker   . Smokeless tobacco: Never Used  . Alcohol Use: No  . Drug Use: No  . Sexual Activity: Not on file     Comment: Divorced. 1 son who lives in [place].  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 Non smoker, no tobacco use. No significant ETOH use   Other Topics Concern  . None   Social History Narrative      Review of Systems  Constitutional: Negative for fever and chills.  Respiratory: Negative for cough and shortness of breath.   Cardiovascular: Negative for chest pain.  Gastrointestinal: Negative for nausea, vomiting, abdominal pain and blood in stool.  Genitourinary: Positive for frequency.       Occ dysuria/hematuria/incontinence  Musculoskeletal: Positive for arthralgias. Negative for back pain.  Neurological: Negative for headaches.       Occ balance/memory difficulties    Vital Signs:  BP 132/77 mmHg  Pulse 67  Temp(Src) 97.7 F (36.5 C) (Oral)  Resp 16  Ht 5' 9.25" (1.759 m)  Wt 160 lb (72.576 kg)  BMI 23.46 kg/m2  SpO2 99%  Physical Exam  Constitutional: He is oriented to person, place, and time. He appears well-developed and well-nourished.  Neck:  Mild left [place] fullness  Cardiovascular: Normal rate and regular rhythm.   Pulmonary/Chest: Effort normal and breath sounds normal.  Abdominal: Soft. Bowel sounds are normal. There is no tenderness.  Musculoskeletal: Normal range of motion. He exhibits no edema.  Neurological: He is alert and oriented to person,  place, and time.    Imaging: Mr Jeri Cos TT Contrast  08/12/2014   CLINICAL DATA:  New diagnosis of bladder carcinoma. No reported neurologic symptoms.  EXAM: MRI HEAD WITHOUT AND WITH CONTRAST  TECHNIQUE: Multiplanar, multiecho pulse sequences of the brain and surrounding structures were obtained without and with intravenous contrast.  CONTRAST:  67mL MULTIHANCE GADOBENATE DIMEGLUMINE 529 MG/ML IV SOLN  COMPARISON:  PET scan 08/08/2014 reveals hepatic lesions, and widespread nodal metastases, but no osseous disease.  FINDINGS: The patient was unable to remain motionless for the exam. Small or subtle lesions could be overlooked.  No evidence for acute infarction, hemorrhage, mass lesion, hydrocephalus, or extra-axial fluid. Generalized cerebral and cerebellar atrophy. Relatively minor white matter disease. Pituitary, pineal, and cerebellar tonsils unremarkable. No upper cervical lesions. Flow voids are maintained throughout the carotid, basilar, and vertebral arteries. There are no areas of chronic hemorrhage.  Post infusion, no abnormal enhancement of the brain or meninges. Minor LEFT maxillary sinus layering fluid. No osseous lesions. Major dural venous sinuses patent. Extracranial soft tissues unremarkable.  IMPRESSION: Negative for metastatic disease.  Chronic changes as described.   Electronically Signed   By: Rolla Flatten M.D.   On: 08/12/2014 13:25   Nm Pet Image Initial (pi) Skull Base To Thigh  08/08/2014   CLINICAL DATA:  Initial treatment strategy for newly diagnosed bladder cancer. Evaluate liver lesion on CT.  EXAM: NUCLEAR MEDICINE PET SKULL BASE TO THIGH  TECHNIQUE: 8.9 mCi F-18 FDG was injected intravenously. Full-ring PET imaging was performed from the skull base to thigh after the radiotracer. CT data was obtained and used for attenuation correction and anatomic localization.  FASTING BLOOD GLUCOSE:  Value: 92 mg/dl  COMPARISON:  Unenhanced CT abdomen pelvis dated 07/18/2014. Enhanced CT  abdomen pelvis dated 07/09/2014.  FINDINGS: NECK  Left supraclavicular lymphadenopathy measuring up to 1.6 cm, max SUV 9.7.  CHEST  No suspicious pulmonary nodules on CT.  Mediastinal lymphadenopathy, including:  9 mm short axis left paratracheal node (series 4/image 2), max SUV 5.3  12 mm short axis prevascular node (series 4/image 61), max SUV 5.6  ABDOMEN/PELVIS  2.3 x 2.1 cm lesion in the right hepatic dome (series 4/image 33), max SUV 6.5, suspicious for metastasis.  Mild left hydroureteronephrosis.  Irregular left bladder wall thickening, compatible with known primary bladder neoplasm.  Retroperitoneal lymphadenopathy, including:  --2.0 cm short axis left para-aortic node (series 4/ image 120), max SUV 13.6  --1.4 cm short axis No posterior to the right renal vein (series 4/ image 121), max SUV 6.6  --2.0 cm short axis left inferior para-aortic node (series 4/ image 132), max SUV 10.1  --1.5 cm short axis right common iliac node (series 4/ image 146), max SUV 11.1  --2.3 cm short axis right obturator node (series 4/image 164), max SUV 13.5  --1.5 cm short axis left external iliac  node (series 4/image 167), max SUV 13.6  1.6 x 1.6 cm pelvic implant (series 4/image 131), suspicious for peritoneal disease.  SKELETON  No focal hypermetabolic activity to suggest skeletal metastasis.  IMPRESSION: Irregular bladder wall thickening, compatible with known primary bladder neoplasm. Associated mild left hydroureteronephrosis.  Extensive retroperitoneal nodal metastases. Associated pelvic implant, suspicious for peritoneal disease.  2.3 cm metastasis in the right hepatic dome.  Associated mediastinal and left supraclavicular nodal metastases.   Electronically Signed   By: Julian Hy M.D.   On: 08/08/2014 17:08    Labs:  CBC:  Recent Labs  07/17/14 0550 07/18/14 0550 07/31/14 1542 08/21/14 1045  WBC  --   --  13.1* 7.3  HGB 14.1 13.8 14.5 15.1  HCT 44.2 43.2 43.8 45.8  PLT  --   --  226 153     COAGS:  Recent Labs  08/21/14 1045  INR 0.91  APTT 30    BMP:  Recent Labs  07/18/14 0550 07/19/14 0520 07/31/14 1542 08/21/14 1045  NA 140 141 139 137  K 4.1 4.0 4.4 4.1  CL 107 107 105 103  CO2 25 25 28 28   GLUCOSE 135* 123* 113* 105*  BUN 17 19 20 21   CALCIUM 8.2* 8.3* 8.8 9.0  CREATININE 3.06* 2.05* 1.47* 1.37*  GFRNONAA 21* 33* 50* 54*  GFRAA 24* 39* 58* 63*    LIVER FUNCTION TESTS:  Recent Labs  07/31/14 1542  BILITOT 0.5  AST 23  ALT 16  ALKPHOS 71  PROT 7.2  ALBUMIN 3.8    TUMOR MARKERS: No results for input(s): AFPTM, CEA, CA199, CHROMGRNA in the last 8760 hours.  Assessment and Plan: AMADI FRADY is a 64 y.o. male with history of recently diagnosed metastatic bladder cancer who presents today for Port-A-Cath placement for chemotherapy.Risks and Benefits discussed with the patient/patient's son, including, but not limited to bleeding, infection, pneumothorax, or fibrin sheath development and need for additional procedures. All of the patient's questions were answered, patient is agreeable to proceed.Consent signed.      Signed: Autumn Messing 08/21/2014, 11:50 AM   I spent a total of 20 minutes face to face in clinical consultation, greater than 50% of which was counseling/coordinating care for Port-A-Cath placement.

## 2014-08-21 NOTE — Procedures (Signed)
Interventional Radiology Procedure Note  Procedure: Placement of a right IJ approach single lumen PowerPort.  Tip is positioned at the superior cavoatrial junction and catheter is ready for immediate use.  Complications: No immediate Recommendations:  - Ok to shower tomorrow - Do not submerge for 7 days - Routine line care   Signed,  Makahla Kiser K. Jessah Danser, MD   

## 2014-08-21 NOTE — Discharge Instructions (Signed)

## 2014-08-23 ENCOUNTER — Encounter (HOSPITAL_COMMUNITY): Payer: Commercial Managed Care - HMO

## 2014-08-23 ENCOUNTER — Encounter (HOSPITAL_COMMUNITY): Payer: Commercial Managed Care - HMO | Attending: Hematology & Oncology

## 2014-08-23 ENCOUNTER — Encounter (HOSPITAL_COMMUNITY): Payer: Self-pay

## 2014-08-23 DIAGNOSIS — C679 Malignant neoplasm of bladder, unspecified: Secondary | ICD-10-CM

## 2014-08-23 DIAGNOSIS — C7910 Secondary malignant neoplasm of unspecified urinary organs: Secondary | ICD-10-CM | POA: Insufficient documentation

## 2014-08-23 DIAGNOSIS — N402 Nodular prostate without lower urinary tract symptoms: Secondary | ICD-10-CM | POA: Diagnosis not present

## 2014-08-23 DIAGNOSIS — Z5111 Encounter for antineoplastic chemotherapy: Secondary | ICD-10-CM

## 2014-08-23 DIAGNOSIS — C675 Malignant neoplasm of bladder neck: Secondary | ICD-10-CM

## 2014-08-23 LAB — COMPREHENSIVE METABOLIC PANEL
ALBUMIN: 3.9 g/dL (ref 3.5–5.2)
ALT: 12 U/L (ref 0–53)
ANION GAP: 7 (ref 5–15)
AST: 22 U/L (ref 0–37)
Alkaline Phosphatase: 57 U/L (ref 39–117)
BUN: 19 mg/dL (ref 6–23)
CHLORIDE: 102 mmol/L (ref 96–112)
CO2: 26 mmol/L (ref 19–32)
Calcium: 9.1 mg/dL (ref 8.4–10.5)
Creatinine, Ser: 1.17 mg/dL (ref 0.50–1.35)
GFR calc Af Amer: 76 mL/min — ABNORMAL LOW (ref >90)
GFR calc non Af Amer: 66 mL/min — ABNORMAL LOW (ref >90)
Glucose, Bld: 101 mg/dL — ABNORMAL HIGH (ref 70–99)
Potassium: 4 mmol/L (ref 3.5–5.1)
Sodium: 135 mmol/L (ref 135–145)
Total Bilirubin: 0.5 mg/dL (ref 0.3–1.2)
Total Protein: 6.9 g/dL (ref 6.0–8.3)

## 2014-08-23 LAB — CBC WITH DIFFERENTIAL/PLATELET
Basophils Absolute: 0 10*3/uL (ref 0.0–0.1)
Basophils Relative: 0 % (ref 0–1)
EOS PCT: 3 % (ref 0–5)
Eosinophils Absolute: 0.2 10*3/uL (ref 0.0–0.7)
HEMATOCRIT: 40.5 % (ref 39.0–52.0)
HEMOGLOBIN: 13.5 g/dL (ref 13.0–17.0)
Lymphocytes Relative: 32 % (ref 12–46)
Lymphs Abs: 2.3 10*3/uL (ref 0.7–4.0)
MCH: 29.3 pg (ref 26.0–34.0)
MCHC: 33.3 g/dL (ref 30.0–36.0)
MCV: 87.9 fL (ref 78.0–100.0)
MONO ABS: 0.5 10*3/uL (ref 0.1–1.0)
MONOS PCT: 7 % (ref 3–12)
Neutro Abs: 4.1 10*3/uL (ref 1.7–7.7)
Neutrophils Relative %: 58 % (ref 43–77)
Platelets: 145 10*3/uL — ABNORMAL LOW (ref 150–400)
RBC: 4.61 MIL/uL (ref 4.22–5.81)
RDW: 13.9 % (ref 11.5–15.5)
WBC: 7.2 10*3/uL (ref 4.0–10.5)

## 2014-08-23 MED ORDER — SODIUM CHLORIDE 0.9 % IV SOLN
1000.0000 mg/m2 | Freq: Once | INTRAVENOUS | Status: AC
  Administered 2014-08-23: 1900 mg via INTRAVENOUS
  Filled 2014-08-23: qty 49.97

## 2014-08-23 MED ORDER — DEXAMETHASONE SODIUM PHOSPHATE 10 MG/ML IJ SOLN: 20.0000 mg | Freq: Once | INTRAMUSCULAR | Status: DC

## 2014-08-23 MED ORDER — SODIUM CHLORIDE 0.9 % IV SOLN
472.0000 mg | Freq: Once | INTRAVENOUS | Status: AC
  Administered 2014-08-23: 470 mg via INTRAVENOUS
  Filled 2014-08-23: qty 47

## 2014-08-23 MED ORDER — SODIUM CHLORIDE 0.9 % IV SOLN: 8.0000 mg | Freq: Once | INTRAVENOUS | Status: DC

## 2014-08-23 MED ORDER — SODIUM CHLORIDE 0.9 % IV SOLN
Freq: Once | INTRAVENOUS | Status: AC
  Administered 2014-08-23: 8 mg via INTRAVENOUS
  Filled 2014-08-23: qty 4

## 2014-08-23 MED ORDER — SODIUM CHLORIDE 0.9 % IJ SOLN: 10.0000 mL | INTRAMUSCULAR | Status: DC | PRN

## 2014-08-23 MED ORDER — HEPARIN SOD (PORK) LOCK FLUSH 100 UNIT/ML IV SOLN
500.0000 [IU] | Freq: Once | INTRAVENOUS | Status: AC | PRN
  Administered 2014-08-23: 500 [IU]
  Filled 2014-08-23: qty 5

## 2014-08-23 MED ORDER — SODIUM CHLORIDE 0.9 % IV SOLN
Freq: Once | INTRAVENOUS | Status: AC
  Administered 2014-08-23: 11:00:00 via INTRAVENOUS

## 2014-08-23 NOTE — Progress Notes (Signed)
Chemo teaching done for Carboplatin & Gemzar. Med calendar given to patient. Distress screening done.

## 2014-08-23 NOTE — Progress Notes (Signed)
Jesus [name] Tolerated chemotherapy well today.  Discharged ambulatory

## 2014-08-23 NOTE — Patient Instructions (Signed)
[facility] Discharge Instructions for Patients Receiving Chemotherapy  Today you received the following chemotherapy agents gemzar, carbo Please call the clinic if you have any questions or concerns.  Follow up as scheduled.   To help prevent nausea and vomiting after your treatment, we encourage you to take your nausea medication    If you develop nausea and vomiting that is not controlled by your nausea medication, call the clinic. If it is after clinic hours your family physician or the after hours number for the clinic or go to the Emergency Department.   BELOW ARE SYMPTOMS THAT SHOULD BE REPORTED IMMEDIATELY:  *FEVER GREATER THAN 101.0 F  *CHILLS WITH OR WITHOUT FEVER  NAUSEA AND VOMITING THAT IS NOT CONTROLLED WITH YOUR NAUSEA MEDICATION  *UNUSUAL SHORTNESS OF BREATH  *UNUSUAL BRUISING OR BLEEDING  TENDERNESS IN MOUTH AND THROAT WITH OR WITHOUT PRESENCE OF ULCERS  *URINARY PROBLEMS  *BOWEL PROBLEMS  UNUSUAL RASH Items with * indicate a potential emergency and should be followed up as soon as possible.  One of the nurses will contact you 24 hours after your treatment. Please let the nurse know about any problems that you may have experienced. Feel free to call the clinic you have any questions or concerns. The clinic phone number is [phone].   I have been informed and understand all the instructions given to me. I know to contact the clinic, my physician, or go to the Emergency Department if any problems should occur. I do not have any questions at this time, but understand that I may call the clinic during office hours or the Patient Navigator at [phone] should I have any questions or need assistance in obtaining follow up care.    Gemcitabine injection What is this medicine? GEMCITABINE (jem SIT a been) is a chemotherapy drug. This medicine is used to treat many types of cancer like breast cancer, lung cancer, pancreatic cancer, and ovarian  cancer. This medicine may be used for other purposes; ask your health care provider or pharmacist if you have questions. COMMON BRAND NAME(S): Gemzar What should I tell my health care provider before I take this medicine? They need to know if you have any of these conditions: -blood disorders -infection -kidney disease -liver disease -recent or ongoing radiation therapy -an unusual or allergic reaction to gemcitabine, other chemotherapy, other medicines, foods, dyes, or preservatives -pregnant or trying to get pregnant -breast-feeding How should I use this medicine? This drug is given as an infusion into a vein. It is administered in a hospital or clinic by a specially trained health care professional. Talk to your pediatrician regarding the use of this medicine in children. Special care may be needed. Overdosage: If you think you have taken too much of this medicine contact a poison control center or emergency room at once. NOTE: This medicine is only for you. Do not share this medicine with others. What if I miss a dose? It is important not to miss your dose. Call your doctor or health care professional if you are unable to keep an appointment. What may interact with this medicine? -medicines to increase blood counts like filgrastim, pegfilgrastim, sargramostim -some other chemotherapy drugs like cisplatin -vaccines Talk to your doctor or health care professional before taking any of these medicines: -acetaminophen -aspirin -ibuprofen -ketoprofen -naproxen This list may not describe all possible interactions. Give your health care provider a list of all the medicines, herbs, non-prescription drugs, or dietary supplements you use. Also tell  them if you smoke, drink alcohol, or use illegal drugs. Some items may interact with your medicine. What should I watch for while using this medicine? Visit your doctor for checks on your progress. This drug may make you feel generally unwell.  This is not uncommon, as chemotherapy can affect healthy cells as well as cancer cells. Report any side effects. Continue your course of treatment even though you feel ill unless your doctor tells you to stop. In some cases, you may be given additional medicines to help with side effects. Follow all directions for their use. Call your doctor or health care professional for advice if you get a fever, chills or sore throat, or other symptoms of a cold or flu. Do not treat yourself. This drug decreases your body's ability to fight infections. Try to avoid being around people who are sick. This medicine may increase your risk to bruise or bleed. Call your doctor or health care professional if you notice any unusual bleeding. Be careful brushing and flossing your teeth or using a toothpick because you may get an infection or bleed more easily. If you have any dental work done, tell your dentist you are receiving this medicine. Avoid taking products that contain aspirin, acetaminophen, ibuprofen, naproxen, or ketoprofen unless instructed by your doctor. These medicines may hide a fever. Women should inform their doctor if they wish to become pregnant or think they might be pregnant. There is a potential for serious side effects to an unborn child. Talk to your health care professional or pharmacist for more information. Do not breast-feed an infant while taking this medicine. What side effects may I notice from receiving this medicine? Side effects that you should report to your doctor or health care professional as soon as possible: -allergic reactions like skin rash, itching or hives, swelling of the face, lips, or tongue -low blood counts - this medicine may decrease the number of white blood cells, red blood cells and platelets. You may be at increased risk for infections and bleeding. -signs of infection - fever or chills, cough, sore throat, pain or difficulty passing urine -signs of decreased platelets  or bleeding - bruising, pinpoint red spots on the skin, black, tarry stools, blood in the urine -signs of decreased red blood cells - unusually weak or tired, fainting spells, lightheadedness -breathing problems -chest pain -mouth sores -nausea and vomiting -pain, swelling, redness at site where injected -pain, tingling, numbness in the hands or feet -stomach pain -swelling of ankles, feet, hands -unusual bleeding Side effects that usually do not require medical attention (report to your doctor or health care professional if they continue or are bothersome): -constipation -diarrhea -hair loss -loss of appetite -stomach upset This list may not describe all possible side effects. Call your doctor for medical advice about side effects. You may report side effects to FDA at 1-800-FDA-1088. Where should I keep my medicine? This drug is given in a hospital or clinic and will not be stored at home. NOTE: This sheet is a summary. It may not cover all possible information. If you have questions about this medicine, talk to your doctor, pharmacist, or health care provider.  2015, Elsevier/Gold Standard. (2007-10-17 18:45:54)   Carboplatin injection What is this medicine? CARBOPLATIN (KAR boe pla tin) is a chemotherapy drug. It targets fast dividing cells, like cancer cells, and causes these cells to die. This medicine is used to treat ovarian cancer and many other cancers. This medicine may be used for other purposes; ask  your health care provider or pharmacist if you have questions. COMMON BRAND NAME(S): Paraplatin What should I tell my health care provider before I take this medicine? They need to know if you have any of these conditions: -blood disorders -hearing problems -kidney disease -recent or ongoing radiation therapy -an unusual or allergic reaction to carboplatin, cisplatin, other chemotherapy, other medicines, foods, dyes, or preservatives -pregnant or trying to get  pregnant -breast-feeding How should I use this medicine? This drug is usually given as an infusion into a vein. It is administered in a hospital or clinic by a specially trained health care professional. Talk to your pediatrician regarding the use of this medicine in children. Special care may be needed. Overdosage: If you think you have taken too much of this medicine contact a poison control center or emergency room at once. NOTE: This medicine is only for you. Do not share this medicine with others. What if I miss a dose? It is important not to miss a dose. Call your doctor or health care professional if you are unable to keep an appointment. What may interact with this medicine? -medicines for seizures -medicines to increase blood counts like filgrastim, pegfilgrastim, sargramostim -some antibiotics like amikacin, gentamicin, neomycin, streptomycin, tobramycin -vaccines Talk to your doctor or health care professional before taking any of these medicines: -acetaminophen -aspirin -ibuprofen -ketoprofen -naproxen This list may not describe all possible interactions. Give your health care provider a list of all the medicines, herbs, non-prescription drugs, or dietary supplements you use. Also tell them if you smoke, drink alcohol, or use illegal drugs. Some items may interact with your medicine. What should I watch for while using this medicine? Your condition will be monitored carefully while you are receiving this medicine. You will need important blood work done while you are taking this medicine. This drug may make you feel generally unwell. This is not uncommon, as chemotherapy can affect healthy cells as well as cancer cells. Report any side effects. Continue your course of treatment even though you feel ill unless your doctor tells you to stop. In some cases, you may be given additional medicines to help with side effects. Follow all directions for their use. Call your doctor or  health care professional for advice if you get a fever, chills or sore throat, or other symptoms of a cold or flu. Do not treat yourself. This drug decreases your body's ability to fight infections. Try to avoid being around people who are sick. This medicine may increase your risk to bruise or bleed. Call your doctor or health care professional if you notice any unusual bleeding. Be careful brushing and flossing your teeth or using a toothpick because you may get an infection or bleed more easily. If you have any dental work done, tell your dentist you are receiving this medicine. Avoid taking products that contain aspirin, acetaminophen, ibuprofen, naproxen, or ketoprofen unless instructed by your doctor. These medicines may hide a fever. Do not become pregnant while taking this medicine. Women should inform their doctor if they wish to become pregnant or think they might be pregnant. There is a potential for serious side effects to an unborn child. Talk to your health care professional or pharmacist for more information. Do not breast-feed an infant while taking this medicine. What side effects may I notice from receiving this medicine? Side effects that you should report to your doctor or health care professional as soon as possible: -allergic reactions like skin rash, itching or hives, swelling  of the face, lips, or tongue -signs of infection - fever or chills, cough, sore throat, pain or difficulty passing urine -signs of decreased platelets or bleeding - bruising, pinpoint red spots on the skin, black, tarry stools, nosebleeds -signs of decreased red blood cells - unusually weak or tired, fainting spells, lightheadedness -breathing problems -changes in hearing -changes in vision -chest pain -high blood pressure -low blood counts - This drug may decrease the number of white blood cells, red blood cells and platelets. You may be at increased risk for infections and bleeding. -nausea and  vomiting -pain, swelling, redness or irritation at the injection site -pain, tingling, numbness in the hands or feet -problems with balance, talking, walking -trouble passing urine or change in the amount of urine Side effects that usually do not require medical attention (report to your doctor or health care professional if they continue or are bothersome): -hair loss -loss of appetite -metallic taste in the mouth or changes in taste This list may not describe all possible side effects. Call your doctor for medical advice about side effects. You may report side effects to FDA at 1-800-FDA-1088. Where should I keep my medicine? This drug is given in a hospital or clinic and will not be stored at home. NOTE: This sheet is a summary. It may not cover all possible information. If you have questions about this medicine, talk to your doctor, pharmacist, or health care provider.  2015, [employer]vier/Gold Standard. (2007-09-12 14:38:05)

## 2014-08-26 ENCOUNTER — Telehealth (HOSPITAL_COMMUNITY): Payer: Self-pay | Admitting: *Deleted

## 2014-08-26 NOTE — Telephone Encounter (Signed)
No answer at patient's home. Left message with son to call us and let us know.

## 2014-08-30 ENCOUNTER — Encounter (HOSPITAL_COMMUNITY): Payer: Self-pay | Admitting: Hematology & Oncology

## 2014-08-30 ENCOUNTER — Encounter (HOSPITAL_BASED_OUTPATIENT_CLINIC_OR_DEPARTMENT_OTHER): Payer: Commercial Managed Care - HMO | Admitting: Hematology & Oncology

## 2014-08-30 ENCOUNTER — Encounter (HOSPITAL_BASED_OUTPATIENT_CLINIC_OR_DEPARTMENT_OTHER): Payer: Commercial Managed Care - HMO

## 2014-08-30 VITALS — BP 131/81 | HR 79 | Temp 98.2°F | Resp 18 | Wt 169.3 lb

## 2014-08-30 DIAGNOSIS — C679 Malignant neoplasm of bladder, unspecified: Secondary | ICD-10-CM

## 2014-08-30 DIAGNOSIS — Z5111 Encounter for antineoplastic chemotherapy: Secondary | ICD-10-CM

## 2014-08-30 DIAGNOSIS — C7910 Secondary malignant neoplasm of unspecified urinary organs: Secondary | ICD-10-CM | POA: Diagnosis not present

## 2014-08-30 LAB — CBC WITH DIFFERENTIAL/PLATELET
Basophils Absolute: 0 10*3/uL (ref 0.0–0.1)
Basophils Relative: 0 % (ref 0–1)
EOS ABS: 0.1 10*3/uL (ref 0.0–0.7)
Eosinophils Relative: 1 % (ref 0–5)
HEMATOCRIT: 39 % (ref 39.0–52.0)
Hemoglobin: 12.7 g/dL — ABNORMAL LOW (ref 13.0–17.0)
LYMPHS PCT: 42 % (ref 12–46)
Lymphs Abs: 1.8 10*3/uL (ref 0.7–4.0)
MCH: 28.7 pg (ref 26.0–34.0)
MCHC: 32.6 g/dL (ref 30.0–36.0)
MCV: 88 fL (ref 78.0–100.0)
MONO ABS: 0.3 10*3/uL (ref 0.1–1.0)
Monocytes Relative: 6 % (ref 3–12)
Neutro Abs: 2.2 10*3/uL (ref 1.7–7.7)
Neutrophils Relative %: 51 % (ref 43–77)
Platelets: 106 10*3/uL — ABNORMAL LOW (ref 150–400)
RBC: 4.43 MIL/uL (ref 4.22–5.81)
RDW: 13.4 % (ref 11.5–15.5)
WBC: 4.4 10*3/uL (ref 4.0–10.5)

## 2014-08-30 LAB — BASIC METABOLIC PANEL
Anion gap: 7 (ref 5–15)
BUN: 18 mg/dL (ref 6–23)
CHLORIDE: 105 mmol/L (ref 96–112)
CO2: 24 mmol/L (ref 19–32)
CREATININE: 1.17 mg/dL (ref 0.50–1.35)
Calcium: 8.6 mg/dL (ref 8.4–10.5)
GFR calc Af Amer: 76 mL/min — ABNORMAL LOW (ref >90)
GFR calc non Af Amer: 66 mL/min — ABNORMAL LOW (ref >90)
GLUCOSE: 112 mg/dL — AB (ref 70–99)
Potassium: 4.1 mmol/L (ref 3.5–5.1)
Sodium: 136 mmol/L (ref 135–145)

## 2014-08-30 MED ORDER — SODIUM CHLORIDE 0.9 % IV SOLN: 8.0000 mg | Freq: Once | INTRAVENOUS | Status: DC

## 2014-08-30 MED ORDER — DEXAMETHASONE SODIUM PHOSPHATE 10 MG/ML IJ SOLN: 20.0000 mg | Freq: Once | INTRAMUSCULAR | Status: DC

## 2014-08-30 MED ORDER — SODIUM CHLORIDE 0.9 % IV SOLN
750.0000 mg/m2 | Freq: Once | INTRAVENOUS | Status: AC
  Administered 2014-08-30: 1444 mg via INTRAVENOUS
  Filled 2014-08-30: qty 37.98

## 2014-08-30 MED ORDER — SODIUM CHLORIDE 0.9 % IJ SOLN: 10.0000 mL | INTRAMUSCULAR | Status: DC | PRN

## 2014-08-30 MED ORDER — SODIUM CHLORIDE 0.9 % IV SOLN
Freq: Once | INTRAVENOUS | Status: AC
  Administered 2014-08-30: 8 mg via INTRAVENOUS
  Filled 2014-08-30: qty 4

## 2014-08-30 MED ORDER — SODIUM CHLORIDE 0.9 % IV SOLN
Freq: Once | INTRAVENOUS | Status: AC
  Administered 2014-08-30: 10:00:00 via INTRAVENOUS

## 2014-08-30 MED ORDER — HEPARIN SOD (PORK) LOCK FLUSH 100 UNIT/ML IV SOLN
500.0000 [IU] | Freq: Once | INTRAVENOUS | Status: AC | PRN
  Administered 2014-08-30: 500 [IU]

## 2014-08-30 MED ORDER — HEPARIN SOD (PORK) LOCK FLUSH 100 UNIT/ML IV SOLN
INTRAVENOUS | Status: AC
  Filled 2014-08-30: qty 5

## 2014-08-30 NOTE — Patient Instructions (Signed)
[place] [facility]  Discharge Instructions:  You saw Dr Whitney Muse today.  Follow up with the doctor in 2 weeks.  Chemotherapy today.  Chemotherapy as scheduled.  Please call the clinic if you have any questions or concerns _______________________________________________________________  Thank you for choosing [place] at [facility] to provide your oncology and hematology care.  To afford each patient quality time with our providers, please arrive at least 15 minutes before your scheduled appointment.  You need to re-schedule your appointment if you arrive 10 or more minutes late.  We strive to give you quality time with our providers, and arriving late affects you and other patients whose appointments are after yours.  Also, if you no show three or more times for appointments you may be dismissed from the clinic.  Again, thank you for choosing [place] [facility] [place] hope is that these requests will allow you access to exceptional care and in a timely manner. _______________________________________________________________  If you have questions after your visit, please contact our office at [phone] between the hours of 8:30 a.m. and 5:00 p.m. Voicemails left after 4:30 p.m. will not be returned until the following business day. _______________________________________________________________  For prescription refill requests, have your pharmacy contact our office. _______________________________________________________________  Recommendations made by the consultant and any test results will be sent to your referring physician. _______________________________________________________________   Gemcitabine injection What is this medicine? GEMCITABINE (jem SIT a been) is a chemotherapy drug. This medicine is used to treat many types of cancer like breast cancer, lung cancer, pancreatic cancer, and ovarian  cancer. This medicine may be used for other purposes; ask your health care provider or pharmacist if you have questions. COMMON BRAND NAME(S): Gemzar What should I tell my health care provider before I take this medicine? They need to know if you have any of these conditions: -blood disorders -infection -kidney disease -liver disease -recent or ongoing radiation therapy -an unusual or allergic reaction to gemcitabine, other chemotherapy, other medicines, foods, dyes, or preservatives -pregnant or trying to get pregnant -breast-feeding How should I use this medicine? This drug is given as an infusion into a vein. It is administered in a hospital or clinic by a specially trained health care professional. Talk to your pediatrician regarding the use of this medicine in children. Special care may be needed. Overdosage: If you think you have taken too much of this medicine contact a poison control center or emergency room at once. NOTE: This medicine is only for you. Do not share this medicine with others. What if I miss a dose? It is important not to miss your dose. Call your doctor or health care professional if you are unable to keep an appointment. What may interact with this medicine? -medicines to increase blood counts like filgrastim, pegfilgrastim, sargramostim -some other chemotherapy drugs like cisplatin -vaccines Talk to your doctor or health care professional before taking any of these medicines: -acetaminophen -aspirin -ibuprofen -ketoprofen -naproxen This list may not describe all possible interactions. Give your health care provider a list of all the medicines, herbs, non-prescription drugs, or dietary supplements you use. Also tell them if you smoke, drink alcohol, or use illegal drugs. Some items may interact with your medicine. What should I watch for while using this medicine? Visit your doctor for checks on your progress. This drug may make you feel generally unwell.  This is not uncommon, as chemotherapy can affect healthy  cells as well as cancer cells. Report any side effects. Continue your course of treatment even though you feel ill unless your doctor tells you to stop. In some cases, you may be given additional medicines to help with side effects. Follow all directions for their use. Call your doctor or health care professional for advice if you get a fever, chills or sore throat, or other symptoms of a cold or flu. Do not treat yourself. This drug decreases your body's ability to fight infections. Try to avoid being around people who are sick. This medicine may increase your risk to bruise or bleed. Call your doctor or health care professional if you notice any unusual bleeding. Be careful brushing and flossing your teeth or using a toothpick because you may get an infection or bleed more easily. If you have any dental work done, tell your dentist you are receiving this medicine. Avoid taking products that contain aspirin, acetaminophen, ibuprofen, naproxen, or ketoprofen unless instructed by your doctor. These medicines may hide a fever. Women should inform their doctor if they wish to become pregnant or think they might be pregnant. There is a potential for serious side effects to an unborn child. Talk to your health care professional or pharmacist for more information. Do not breast-feed an infant while taking this medicine. What side effects may I notice from receiving this medicine? Side effects that you should report to your doctor or health care professional as soon as possible: -allergic reactions like skin rash, itching or hives, swelling of the face, lips, or tongue -low blood counts - this medicine may decrease the number of white blood cells, red blood cells and platelets. You may be at increased risk for infections and bleeding. -signs of infection - fever or chills, cough, sore throat, pain or difficulty passing urine -signs of decreased platelets  or bleeding - bruising, pinpoint red spots on the skin, black, tarry stools, blood in the urine -signs of decreased red blood cells - unusually weak or tired, fainting spells, lightheadedness -breathing problems -chest pain -mouth sores -nausea and vomiting -pain, swelling, redness at site where injected -pain, tingling, numbness in the hands or feet -stomach pain -swelling of ankles, feet, hands -unusual bleeding Side effects that usually do not require medical attention (report to your doctor or health care professional if they continue or are bothersome): -constipation -diarrhea -hair loss -loss of appetite -stomach upset This list may not describe all possible side effects. Call your doctor for medical advice about side effects. You may report side effects to FDA at 1-800-FDA-1088. Where should I keep my medicine? This drug is given in a hospital or clinic and will not be stored at home. NOTE: This sheet is a summary. It may not cover all possible information. If you have questions about this medicine, talk to your doctor, pharmacist, or health care provider.  2015, Elsevier/Gold Standard. (2007-10-17 18:45:54)

## 2014-08-30 NOTE — Progress Notes (Signed)
[place] CONSULT NOTE  Patient Care Team: Delphina Cahill, MD as PCP - General (Internal Medicine)  CHIEF COMPLAINTS/PURPOSE OF CONSULTATION:  "I have bladder cancer" T4a, N2, MX   PROCEDURES: 07/16/2014 1. Transrectal prostatic ultrasound with ultrasound-guided prostate  biopsy. 2. Cystoscopy and transurethral resection of large bladder tumor.  PREOPERATIVE DIAGNOSIS: Probable metastatic bladder cancer with prostate nodule.  POSTOPERATIVE DIAGNOSIS: Probable metastatic bladder cancer with prostate nodule.   HISTORY OF PRESENTING ILLNESS:   TAHA DIMOND 64 y.o. male is here because of newly diagnosed bladder cancer. He has just completed cycle 1 day 1 of carboplatin and Gemzar. He reports absolutely no problems with treatment. He had no nausea or vomiting. He had one episode of hematuria but it resolved. He describes his appetite and energy level as unchanged.     Bladder cancer   07/09/2014 Imaging  CT A/P Irregular and lobular bladder wall thickening suspicious for transitional cell carcinoma. Considerable associated pelvic and retroperitoneal adenopathy. 1.4 by 1.3 cm complex and likely enhancing lesion of the right hepatic lobe dome   07/16/2014 - 07/19/2014 Hospital Admission Hospital admission for TURP. Course complicated by ARF   5/63/1497 Procedure TRANSURETHRAL RESECTION OF BLADDER TUMOR WITH GYRUS (TURBT-GYRUS) CYSTOSCOPY WITH PROSTATE ULTRASOUND AND BIOPSY.  Dr. Irine Seal   08/08/2014 PET scan Irregular bladder wall thickening, compatible with known primary bladder neoplasm. Associated mild left hydroureteronephrosis. Extensive retroperitoneal nodal metastases. Associated pelvic implant, suspicious for peritoneal disease.2.3 metastasis liver     MEDICAL HISTORY:  Past Medical History  Diagnosis Date  . High cholesterol   . Depression     hx of   . Arthritis   . Bladder cancer     SURGICAL HISTORY: Past Surgical History  Procedure  Laterality Date  . Knee surgery    . Tonsillectomy    . Transurethral resection of bladder tumor with gyrus (turbt-gyrus) N/A 07/16/2014    Procedure: TRANSURETHRAL RESECTION OF BLADDER TUMOR WITH GYRUS (TURBT-GYRUS);  Surgeon: Malka So, MD;  Location: [facility];  Service: Urology;  Laterality: N/A;  . Cystoscopy with biopsy N/A 07/16/2014    Procedure: CYSTOSCOPY WITH PROSTATE ULTRASOUND AND BIOPSY;  Surgeon: Malka So, MD;  Location: [facility];  Service: Urology;  Laterality: N/A;    SOCIAL HISTORY: History   Social History  . Marital Status: Single    Spouse Name: N/A  . Number of Children: N/A  . Years of Education: N/A   Occupational History  . Not on file.   Social History Main Topics  . Smoking status: Never Smoker   . Smokeless tobacco: Never Used  . Alcohol Use: No  . Drug Use: No  . Sexual Activity: Not on file     Comment: Divorced. 1 son who lives in [place].  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 Non smoker, no tobacco use. No significant ETOH use   Other Topics Concern  . Not on file   Social History Narrative    FAMILY HISTORY: Family History  Problem Relation Age of Onset  . Cancer Mother   . Stroke Father   . Cancer Sister    indicated that his mother is deceased. He indicated that his father is deceased. He indicated that his sister is alive. He indicated that his maternal grandmother is deceased. He indicated that his maternal grandfather is deceased. He indicated that his paternal grandmother is deceased. He indicated that his paternal grandfather is deceased.  Father died at 78  from a CVA Mother died at 39 from breast cancer, one sister had breast cancer and is still living  ALLERGIES:  has No Known Allergies.  MEDICATIONS:  Current Outpatient Prescriptions  Medication Sig Dispense Refill  . CARBOPLATIN IV Inject into the vein. Starting 08/23/14: Day 1 every 21 days    . folic acid (V-R FOLIC ACID) 710 MCG tablet Take 1 tablet (400 mcg  total) by mouth daily. 30 tablet 6  . Gemcitabine HCl (GEMZAR IV) Inject into the vein. Starting 08/23/14: Day 1 & 8 every 21 days    . lidocaine-prilocaine (EMLA) cream Apply a quarter size amount to port site 1 hour prior to chemo. Do not rub in. Cover with plastic wrap. 30 g 3  . ondansetron (ZOFRAN) 8 MG tablet Take 1 tablet (8 mg total) by mouth every 8 (eight) hours as needed for nausea or vomiting. 30 tablet 2  . prochlorperazine (COMPAZINE) 10 MG tablet Take 1 tablet (10 mg total) by mouth every 6 (six) hours as needed for nausea or vomiting. 30 tablet 2   No current facility-administered medications for this visit.   Facility-Administered Medications Ordered in Other Visits  Medication Dose Route Frequency Provider Last Rate Last Dose  . sodium chloride 0.9 % injection 10 mL  10 mL Intracatheter PRN Patrici Ranks, MD        Review of Systems  Constitutional: Negative for fever, chills, weight loss and malaise/fatigue.  HENT: Negative for congestion, hearing loss, nosebleeds, sore throat and tinnitus.   Eyes: Negative for blurred vision, double vision, pain and discharge.  Respiratory: Negative for cough, hemoptysis, sputum production, shortness of breath and wheezing.   Cardiovascular: Negative for chest pain, palpitations, claudication, leg swelling and PND.  Gastrointestinal: Negative for heartburn, nausea, vomiting, abdominal pain, diarrhea, constipation, blood in stool and melena.  Genitourinary: Negative for dysuria, urgency, frequency and hematuria.  Musculoskeletal: Negative for myalgias, joint pain and falls.  Skin: Negative for itching and rash.  Neurological: Negative for dizziness, tingling, tremors, sensory change, speech change, focal weakness, seizures, loss of consciousness, weakness and headaches.  Endo/Heme/Allergies: Does not bruise/bleed easily.  Psychiatric/Behavioral: Negative for depression, suicidal ideas, memory loss and substance abuse. The patient is not  nervous/anxious and does not have insomnia.     PHYSICAL EXAMINATION: ECOG PERFORMANCE STATUS: 1 - Symptomatic but completely ambulatory  Filed Vitals:   08/30/14 0800  BP: 131/81  Pulse: 79  Temp: 98.2 F (36.8 C)  Resp: 18   Filed Weights   08/30/14 0800  Weight: 169 lb 4.8 oz (76.794 kg)     Physical Exam  Constitutional: He is oriented to person, place, and time and well-developed, well-nourished, and in no distress.  HENT:  Head: Normocephalic and atraumatic.  Nose: Nose normal.  Mouth/Throat: Oropharynx is clear and moist. No oropharyngeal exudate.  Eyes: Conjunctivae and EOM are normal. Pupils are equal, round, and reactive to light. Right eye exhibits no discharge. Left eye exhibits no discharge. No scleral icterus.  Neck: Normal range of motion. Neck supple. No tracheal deviation present. No thyromegaly present.  Cardiovascular: Normal rate, regular rhythm and normal heart sounds.  Exam reveals no gallop and no friction rub.   No murmur heard. Pulmonary/Chest: Effort normal and breath sounds normal. He has no wheezes. He has no rales.  Abdominal: Soft. Bowel sounds are normal. He exhibits no distension and no mass. There is no tenderness. There is no rebound and no guarding.  Musculoskeletal: Normal range of motion. He exhibits  no edema.  Lymphadenopathy:    He has no cervical adenopathy.  Neurological: He is alert and oriented to person, place, and time. He has normal reflexes. No cranial nerve deficit. Gait normal. Coordination normal.  Skin: Skin is warm and dry. No rash noted.  Psychiatric: Mood and affect normal.  Somewhat abnormal affect, pleasant  Nursing note and vitals reviewed.    LABORATORY DATA:  I have reviewed the data as listed Lab Results  Component Value Date   WBC 4.4 08/30/2014   HGB 12.7* 08/30/2014   HCT 39.0 08/30/2014   MCV 88.0 08/30/2014   PLT 106* 08/30/2014     Chemistry      Component Value Date/Time   NA 136 08/30/2014 0915    K 4.1 08/30/2014 0915   CL 105 08/30/2014 0915   CO2 24 08/30/2014 0915   BUN 18 08/30/2014 0915   CREATININE 1.17 08/30/2014 0915      Component Value Date/Time   CALCIUM 8.6 08/30/2014 0915   ALKPHOS 57 08/23/2014 0951   AST 22 08/23/2014 0951   ALT 12 08/23/2014 0951   BILITOT 0.5 08/23/2014 0951       PATHOLOGY:  Diagnosis 1. Prostate, needle biopsy(ies), right base lateral - BENIGN PROSTATIC GLANDS AND STROMA WITH FEATURES OF ATROPHY. 2. Prostate, needle biopsy(ies), right base medial - BENIGN PROSTATIC GLANDS AND STROMA. 3. 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 Prostate, needle biopsy(ies), right mid medial - HIGH GRADE UROTHELIAL CARCINOMA INVOLVING PROSTATIC GLANDS AND DUCTS. SEE COMMENT. 5. Prostate, needle biopsy(ies), right apex lateral - HIGH GRADE UROTHELIAL CARCINOMA INVOLVING PROSTATIC GLANDS AND DUCTS. SEE COMMENT. 6. Prostate, needle biopsy(ies), right apex medial - HIGH GRADE UROTHELIAL CARCINOMA INVOLVING PROSTATIC GLANDS AND DUCTS, SEE COMMENT. 7. Prostate, needle biopsy(ies), left base lateral - BENIGN PROSTATIC GLANDS AND STROMA. 8. Prostate, needle biopsy(ies), left base medial - BENIGN PROSTATIC GLANDS AND STROMA. 9. 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 SEE COMMENT. 10. Prostate, needle biopsy(ies), left mid medial - HIGH GRADE UROTHELIAL CARCINOMA INVOLVING PROSTATIC GLANDS AND DUCTS, SEE COMMENT. 11. Prostate, needle biopsy(ies), left apex lateral - BENIGN PROSTATIC GLANDS AND STROMA WITH FEATURES OF ATROPHY. 12. 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 13. Bladder, transurethral resection, bladder neck, bladder tumor,  prostate - INVASIVE HIGH GRADE UROTHELIAL CARCINOMA. - TUMOR INVOLVES SMOOTH MUSCLE. Microscopic Comment 4. , 5, 6, 9, and 10. In each of the parts, the needle core demonstrates involvement of prostatic glands and ducts by high grade epithelial neoplasm demonstrating strong diffuse cytokeratin 903 and p63 immunostain expression consistent with high grade urothelial carcinoma. There is evidence of invasion present in part 9 and areas suspicious for invasion in part 4. These parts were reviewed by Dr Saralyn Pilar who concurs  RADIOGRAPHIC STUDIES: I have personally reviewed the radiological images as listed and agreed with the findings in the report. No results found.   CLINICAL DATA: Intermittent gross hematuria over the past month.  EXAM: 07/09/2014 CT ABDOMEN AND PELVIS WITHOUT AND WITH CONTRAST  IMPRESSION: 1. Irregular and lobular bladder wall thickening suspicious for transitional cell carcinoma. Considerable associated pelvic and retroperitoneal adenopathy. There is mesenteric edema and edema tracking around the urinary bladder and presacral space. 2. Suspected 2 mm left mid kidney nonobstructive calculus. There is also a 5  mm bladder calculus. 3. I have taking careful density measurements of the apparent parapelvic cyst in the left kidney, and it continues to measure fluid density on all phases, accordingly I doubt that this represents tumor. However, there is some increased density in the adjacent collecting system which could be due to blood products. I do not show a filling defect aside from the parapelvic cyst to constitute evidence of transitional cell carcinoma of the left renal hilum. 4. 1.4 by 1.3 cm complex and likely enhancing lesion of the right hepatic lobe dome is technically nonspecific based on imaging. This could be further assessed with nuclear Medicine PET-CT, or hepatic protocol MRI with and without contrast (using Eovist contrast medium, if feasible). 5.  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   Electronically Signed  By: Sherryl Barters M.D.  On: 07/09/2014 11:27   CLINICAL DATA: Bladder tumor subsequent evaluation. Patient status post transient reach pleural resection of bladder tumor. Elevation of x-ray abdomen. Concern for ureteral obstruction  EXAM: 07/18/2014 CT ABDOMEN AND PELVIS WITHOUT CONTRAST IMPRESSION:  1. Mild bilateral hydronephrosis and hydroureter is increased from prior; however no evidence of high-grade obstruction. 2. Decrease thickening of the posterior bladder wall follow-up surgery. Asymmetric thickening along the left wall. 3. Extensive local iliac lymphadenopathy and left periaortic retroperitoneal metastatic adenopathy. 4. Right middle lobe pulmonary nodule. Consider CT thorax for complete evaluation of lungs.   Electronically Signed  By: Suzy Bouchard M.D.  On: 07/18/2014 09:57  ASSESSMENT & PLAN:   Stage IV bladder cancer  64 year old male with stage IV bladder cancer currently having received cycle 1 day 1 of carboplatin and Gemzar. His tolerance was excellent. Platelet count today is 106,000. I have some concerns keeping Gemzar at thousand milligram per meter squared may cause thrombocytopenia and therefore treatment delays. I will decrease his Gemzar to 750 mg/m.  We will see him back again in 2 weeks prior to cycle 2. I again advised him to let us know. As a difficulties problems or concerns prior to follow-up such as nausea, vomiting, hematuria or new pain.  All questions were answered. The patient knows to call the clinic with any problems, questions or concerns.    Molli Hazard, MD MD 08/30/2014 12:51 PM

## 2014-08-30 NOTE — Progress Notes (Signed)
Tolerated well

## 2014-09-03 ENCOUNTER — Encounter (HOSPITAL_COMMUNITY): Payer: Self-pay | Admitting: Hematology & Oncology

## 2014-09-03 NOTE — Assessment & Plan Note (Signed)
64 year old male with stage IV bladder cancer. Patient is a nonsmoker. I reviewed the PET/CT imaging with the patient and his son. We discussed recommended therapy for advanced bladder cancer, and reviewed the NCCN guidelines. I recommended treatment with carboplatin and Gemzar, it tends to be very well tolerated, and quality of life is very important to both the patient and his family.  We discussed port placement. The patient is agreeable. I therefore recommended proceeding with port placement in setting him up for formal chemotherapy teaching with plans to start therapy in the next week. I advised the patient and his son if they have any interim problems or questions or concerns to call the clinic prior to his next scheduled follow-up.

## 2014-09-03 NOTE — Progress Notes (Signed)
Jesus [name] CONSULT NOTE  Patient Care Team: Delphina Cahill, MD as PCP - General (Internal Medicine)  CHIEF COMPLAINTS/PURPOSE OF CONSULTATION:  "I have bladder cancer" T4a, N2, MX   PROCEDURES: 07/16/2014 1. Transrectal prostatic ultrasound with ultrasound-guided prostate  biopsy. 2. Cystoscopy and transurethral resection of large bladder tumor.  PREOPERATIVE DIAGNOSIS: Probable metastatic bladder cancer with prostate nodule.  POSTOPERATIVE DIAGNOSIS: Probable metastatic bladder cancer with prostate nodule.   HISTORY OF PRESENTING ILLNESS:  Jesus [name] 64 y.o. male is here because of newly diagnosed bladder cancer. He is here today with his son to review recent PET imaging and make additional recommendations regarding therapy. Both the patient and his son had a good idea that he has "advanced disease." At his last visit we did discuss treatment for dense bladder cancer which principally consists of chemotherapy. He has no other major complaints today. He denies recent hematuria, nausea, vomiting, and describes his appetite as baseline.    Bladder cancer   07/09/2014 Imaging  CT A/P Irregular and lobular bladder wall thickening suspicious for transitional cell carcinoma. Considerable associated pelvic and retroperitoneal adenopathy. 1.4 by 1.3 cm complex and likely enhancing lesion of the right hepatic lobe dome   07/16/2014 - 07/19/2014 Hospital Admission Hospital admission for TURP. Course complicated by ARF   02/12/538 Procedure TRANSURETHRAL RESECTION OF BLADDER TUMOR WITH GYRUS (TURBT-GYRUS) CYSTOSCOPY WITH PROSTATE ULTRASOUND AND BIOPSY.  Dr. Irine Seal   08/08/2014 PET scan Irregular bladder wall thickening, compatible with known primary bladder neoplasm. Associated mild left hydroureteronephrosis. Extensive retroperitoneal nodal metastases. Associated pelvic implant, suspicious for peritoneal disease.2.3 metastasis liver     MEDICAL HISTORY:  Past  Medical History  Diagnosis Date  . High cholesterol   . Depression     hx of   . Arthritis   . Bladder cancer     SURGICAL HISTORY: Past Surgical History  Procedure Laterality Date  . Knee surgery    . Tonsillectomy    . Transurethral resection of bladder tumor with gyrus (turbt-gyrus) N/A 07/16/2014    Procedure: TRANSURETHRAL RESECTION OF BLADDER TUMOR WITH GYRUS (TURBT-GYRUS);  Surgeon: Malka So, MD;  Location: [facility];  Service: Urology;  Laterality: N/A;  . Cystoscopy with biopsy N/A 07/16/2014    Procedure: CYSTOSCOPY WITH PROSTATE ULTRASOUND AND BIOPSY;  Surgeon: Malka So, MD;  Location: [facility];  Service: Urology;  Laterality: N/A;    SOCIAL HISTORY: History   Social History  . Marital Status: Single    Spouse Name: N/A  . Number of Children: N/A  . Years of Education: N/A   Occupational History  . Not on file.   Social History Main Topics  . Smoking status: Never Smoker   . Smokeless tobacco: Never Used  . Alcohol Use: No  . Drug Use: No  . Sexual Activity: Not on file     Comment: Divorced. 1 son who lives in [place].  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 Non smoker, no tobacco use. No significant ETOH use   Other Topics Concern  . Not on file   Social History Narrative    FAMILY HISTORY: Family History  Problem Relation Age of Onset  . Cancer Mother   . Stroke Father   . Cancer Sister    indicated that his mother is deceased. He indicated that his father is deceased. He indicated that his sister is alive. He indicated that his maternal grandmother is deceased. He indicated that his maternal  grandfather is deceased. He indicated that his paternal grandmother is deceased. He indicated that his paternal grandfather is deceased.  Father died at 76 from a CVA Mother died at 37 from breast cancer, one sister had breast cancer and is still living  ALLERGIES:  has No Known Allergies.  MEDICATIONS:  Current Outpatient Prescriptions    Medication Sig Dispense Refill  . CARBOPLATIN IV Inject into the vein. Starting 08/23/14: Day 1 every 21 days    . folic acid (V-R FOLIC ACID) 409 MCG tablet Take 1 tablet (400 mcg total) by mouth daily. 30 tablet 6  . Gemcitabine HCl (GEMZAR IV) Inject into the vein. Starting 08/23/14: Day 1 & 8 every 21 days    . lidocaine-prilocaine (EMLA) cream Apply a quarter size amount to port site 1 hour prior to chemo. Do not rub in. Cover with plastic wrap. 30 g 3  . ondansetron (ZOFRAN) 8 MG tablet Take 1 tablet (8 mg total) by mouth every 8 (eight) hours as needed for nausea or vomiting. 30 tablet 2  . prochlorperazine (COMPAZINE) 10 MG tablet Take 1 tablet (10 mg total) by mouth every 6 (six) hours as needed for nausea or vomiting. 30 tablet 2   No current facility-administered medications for this visit.    Review of Systems  Constitutional: Negative for fever, chills, weight loss and malaise/fatigue.  HENT: Negative for congestion, hearing loss, nosebleeds, sore throat and tinnitus.   Eyes: Negative for blurred vision, double vision, pain and discharge.  Respiratory: Negative for cough, hemoptysis, sputum production, shortness of breath and wheezing.   Cardiovascular: Negative for chest pain, palpitations, claudication, leg swelling and PND.  Gastrointestinal: Negative for heartburn, nausea, vomiting, abdominal pain, diarrhea, constipation, blood in stool and melena.  Genitourinary: Negative for dysuria, urgency, frequency and hematuria.  Musculoskeletal: Negative for myalgias, joint pain and falls.  Skin: Negative for itching and rash.  Neurological: Negative for dizziness, tingling, tremors, sensory change, speech change, focal weakness, seizures, loss of consciousness, weakness and headaches.  Endo/Heme/Allergies: Does not bruise/bleed easily.  Psychiatric/Behavioral: Negative for depression, suicidal ideas, memory loss and substance abuse. The patient is not nervous/anxious and does not have  insomnia.     PHYSICAL EXAMINATION: ECOG PERFORMANCE STATUS: 1 - Symptomatic but completely ambulatory  Filed Vitals:   08/13/14 1128  BP: 131/85  Pulse: 73  Temp: 97.4 F (36.3 C)  Resp: 16   Filed Weights   08/13/14 1128  Weight: 163 lb 1.6 oz (73.982 kg)     Physical Exam  Constitutional: He is oriented to person, place, and time and well-developed, well-nourished, and in no distress.  HENT:  Head: Normocephalic and atraumatic.  Nose: Nose normal.  Mouth/Throat: Oropharynx is clear and moist. No oropharyngeal exudate.  Eyes: Conjunctivae and EOM are normal. Pupils are equal, round, and reactive to light. Right eye exhibits no discharge. Left eye exhibits no discharge. No scleral icterus.  Neck: Normal range of motion. Neck supple. No tracheal deviation present. No thyromegaly present.  Cardiovascular: Normal rate, regular rhythm and normal heart sounds.  Exam reveals no gallop and no friction rub.   No murmur heard. Pulmonary/Chest: Effort normal and breath sounds normal. He has no wheezes. He has no rales.  Abdominal: Soft. Bowel sounds are normal. He exhibits no distension and no mass. There is no tenderness. There is no rebound and no guarding.  Musculoskeletal: Normal range of motion. He exhibits no edema.  Lymphadenopathy:    He has no cervical adenopathy.  Neurological: He  is alert and oriented to person, place, and time. He has normal reflexes. No cranial nerve deficit. Gait normal. Coordination normal.  Skin: Skin is warm and dry. No rash noted.  Psychiatric: Mood and affect normal.  Somewhat abnormal affect, pleasant  Nursing note and vitals reviewed.    LABORATORY DATA:  I have reviewed the data as listed Lab Results  Component Value Date   WBC 4.4 08/30/2014   HGB 12.7* 08/30/2014   HCT 39.0 08/30/2014   MCV 88.0 08/30/2014   PLT 106* 08/30/2014     Chemistry      Component Value Date/Time   NA 136 08/30/2014 0915   K 4.1 08/30/2014 0915   CL  105 08/30/2014 0915   CO2 24 08/30/2014 0915   BUN 18 08/30/2014 0915   CREATININE 1.17 08/30/2014 0915      Component Value Date/Time   CALCIUM 8.6 08/30/2014 0915   ALKPHOS 57 08/23/2014 0951   AST 22 08/23/2014 0951   ALT 12 08/23/2014 0951   BILITOT 0.5 08/23/2014 0951       PATHOLOGY:  Diagnosis 1. Prostate, needle biopsy(ies), right base lateral - BENIGN PROSTATIC GLANDS AND STROMA WITH FEATURES OF ATROPHY. 2. Prostate, needle biopsy(ies), right base medial - BENIGN PROSTATIC GLANDS AND STROMA. 3. 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 Prostate, needle biopsy(ies), right mid medial - HIGH GRADE UROTHELIAL CARCINOMA INVOLVING PROSTATIC GLANDS AND DUCTS. SEE COMMENT. 5. Prostate, needle biopsy(ies), right apex lateral - HIGH GRADE UROTHELIAL CARCINOMA INVOLVING PROSTATIC GLANDS AND DUCTS. SEE COMMENT. 6. Prostate, needle biopsy(ies), right apex medial - HIGH GRADE UROTHELIAL CARCINOMA INVOLVING PROSTATIC GLANDS AND DUCTS, SEE COMMENT. 7. Prostate, needle biopsy(ies), left base lateral - BENIGN PROSTATIC GLANDS AND STROMA. 8. Prostate, needle biopsy(ies), left base medial - BENIGN PROSTATIC GLANDS AND STROMA. 9. 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 SEE COMMENT. 10. Prostate, needle biopsy(ies), left mid medial - HIGH GRADE UROTHELIAL CARCINOMA INVOLVING PROSTATIC GLANDS AND DUCTS, SEE COMMENT. 11. Prostate, needle biopsy(ies), left apex lateral - BENIGN PROSTATIC GLANDS AND STROMA WITH FEATURES OF ATROPHY. 12. 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 13. Bladder, transurethral resection, bladder neck, bladder tumor, prostate - INVASIVE HIGH GRADE  UROTHELIAL CARCINOMA. - TUMOR INVOLVES SMOOTH MUSCLE. Microscopic Comment 4. , 5, 6, 9, and 10. In each of the parts, the needle core demonstrates involvement of prostatic glands and ducts by high grade epithelial neoplasm demonstrating strong diffuse cytokeratin 903 and p63 immunostain expression consistent with high grade urothelial carcinoma. There is evidence of invasion present in part 9 and areas suspicious for invasion in part 4. These parts were reviewed by Dr Saralyn Pilar who concurs  RADIOGRAPHIC STUDIES: CLINICAL DATA: Initial treatment strategy for newly diagnosed bladder cancer. Evaluate liver lesion on CT.  EXAM: NUCLEAR MEDICINE PET SKULL BASE TO THIGH IMPRESSION: Irregular bladder wall thickening, compatible with known primary bladder neoplasm. Associated mild left hydroureteronephrosis.  Extensive retroperitoneal nodal metastases. Associated pelvic implant, suspicious for peritoneal disease.  2.3 cm metastasis in the right hepatic dome.  Associated mediastinal and left supraclavicular nodal metastases.   Electronically Signed  By: Julian Hy M.D.  On: 08/08/2014 17:08   ASSESSMENT & PLAN:   Bladder cancer 64 year old male with stage IV bladder cancer. Patient is a nonsmoker. I reviewed the PET/CT  imaging with the patient and his son. We discussed recommended therapy for advanced bladder cancer, and reviewed the NCCN guidelines. I recommended treatment with carboplatin and Gemzar, it tends to be very well tolerated, and quality of life is very important to both the patient and his family.  We discussed port placement. The patient is agreeable. I therefore recommended proceeding with port placement in setting him up for formal chemotherapy teaching with plans to start therapy in the next week. I advised the patient and his son if they have any interim problems or questions or concerns to call the clinic prior to his next scheduled follow-up.    All  questions were answered. The patient knows to call the clinic with any problems, questions or concerns.   Molli Hazard, MD MD 09/03/2014 12:54 PM

## 2014-09-06 ENCOUNTER — Ambulatory Visit (HOSPITAL_COMMUNITY): Payer: Commercial Managed Care - HMO | Admitting: Hematology & Oncology

## 2014-09-06 ENCOUNTER — Inpatient Hospital Stay (HOSPITAL_COMMUNITY): Payer: Commercial Managed Care - HMO

## 2014-09-12 ENCOUNTER — Encounter (HOSPITAL_BASED_OUTPATIENT_CLINIC_OR_DEPARTMENT_OTHER): Payer: Commercial Managed Care - HMO

## 2014-09-12 ENCOUNTER — Encounter (HOSPITAL_BASED_OUTPATIENT_CLINIC_OR_DEPARTMENT_OTHER): Payer: Commercial Managed Care - HMO | Admitting: Hematology & Oncology

## 2014-09-12 ENCOUNTER — Encounter (HOSPITAL_COMMUNITY): Payer: Self-pay | Admitting: Hematology & Oncology

## 2014-09-12 DIAGNOSIS — C679 Malignant neoplasm of bladder, unspecified: Secondary | ICD-10-CM

## 2014-09-12 DIAGNOSIS — Z5111 Encounter for antineoplastic chemotherapy: Secondary | ICD-10-CM | POA: Diagnosis not present

## 2014-09-12 DIAGNOSIS — C7910 Secondary malignant neoplasm of unspecified urinary organs: Secondary | ICD-10-CM | POA: Diagnosis not present

## 2014-09-12 LAB — CBC WITH DIFFERENTIAL/PLATELET
BASOS ABS: 0 10*3/uL (ref 0.0–0.1)
Basophils Relative: 0 % (ref 0–1)
EOS PCT: 0 % (ref 0–5)
Eosinophils Absolute: 0 10*3/uL (ref 0.0–0.7)
HCT: 37.6 % — ABNORMAL LOW (ref 39.0–52.0)
Hemoglobin: 12.3 g/dL — ABNORMAL LOW (ref 13.0–17.0)
LYMPHS ABS: 2 10*3/uL (ref 0.7–4.0)
LYMPHS PCT: 42 % (ref 12–46)
MCH: 28.8 pg (ref 26.0–34.0)
MCHC: 32.7 g/dL (ref 30.0–36.0)
MCV: 88.1 fL (ref 78.0–100.0)
Monocytes Absolute: 0.4 10*3/uL (ref 0.1–1.0)
Monocytes Relative: 9 % (ref 3–12)
Neutro Abs: 2.3 10*3/uL (ref 1.7–7.7)
Neutrophils Relative %: 48 % (ref 43–77)
Platelets: 184 10*3/uL (ref 150–400)
RBC: 4.27 MIL/uL (ref 4.22–5.81)
RDW: 14.3 % (ref 11.5–15.5)
WBC: 4.7 10*3/uL (ref 4.0–10.5)

## 2014-09-12 LAB — BASIC METABOLIC PANEL
Anion gap: 6 (ref 5–15)
BUN: 18 mg/dL (ref 6–23)
CO2: 26 mmol/L (ref 19–32)
Calcium: 8.8 mg/dL (ref 8.4–10.5)
Chloride: 105 mmol/L (ref 96–112)
Creatinine, Ser: 1.12 mg/dL (ref 0.50–1.35)
GFR calc Af Amer: 80 mL/min — ABNORMAL LOW (ref >90)
GFR, EST NON AFRICAN AMERICAN: 69 mL/min — AB (ref >90)
Glucose, Bld: 110 mg/dL — ABNORMAL HIGH (ref 70–99)
POTASSIUM: 4.1 mmol/L (ref 3.5–5.1)
Sodium: 137 mmol/L (ref 135–145)

## 2014-09-12 MED ORDER — SODIUM CHLORIDE 0.9 % IV SOLN: 8.0000 mg | Freq: Once | INTRAVENOUS | Status: DC

## 2014-09-12 MED ORDER — HEPARIN SOD (PORK) LOCK FLUSH 100 UNIT/ML IV SOLN
500.0000 [IU] | Freq: Once | INTRAVENOUS | Status: AC | PRN
  Administered 2014-09-12: 500 [IU]

## 2014-09-12 MED ORDER — SODIUM CHLORIDE 0.9 % IV SOLN
Freq: Once | INTRAVENOUS | Status: AC
  Administered 2014-09-12: 10:00:00 via INTRAVENOUS

## 2014-09-12 MED ORDER — SODIUM CHLORIDE 0.9 % IJ SOLN: 10.0000 mL | INTRAMUSCULAR | Status: DC | PRN

## 2014-09-12 MED ORDER — HEPARIN SOD (PORK) LOCK FLUSH 100 UNIT/ML IV SOLN
INTRAVENOUS | Status: AC
  Filled 2014-09-12: qty 5

## 2014-09-12 MED ORDER — SODIUM CHLORIDE 0.9 % IV SOLN
Freq: Once | INTRAVENOUS | Status: AC
  Administered 2014-09-12: 8 mg via INTRAVENOUS
  Filled 2014-09-12: qty 4

## 2014-09-12 MED ORDER — SODIUM CHLORIDE 0.9 % IV SOLN
487.5000 mg | Freq: Once | INTRAVENOUS | Status: AC
  Administered 2014-09-12: 490 mg via INTRAVENOUS
  Filled 2014-09-12: qty 49

## 2014-09-12 MED ORDER — DEXAMETHASONE SODIUM PHOSPHATE 10 MG/ML IJ SOLN: 20.0000 mg | Freq: Once | INTRAMUSCULAR | Status: DC

## 2014-09-12 MED ORDER — SODIUM CHLORIDE 0.9 % IV SOLN
750.0000 mg/m2 | Freq: Once | INTRAVENOUS | Status: AC
  Administered 2014-09-12: 1444 mg via INTRAVENOUS
  Filled 2014-09-12: qty 37.98

## 2014-09-12 NOTE — Progress Notes (Signed)
[place] Progress Note  Patient Care Team: Delphina Cahill, MD as PCP - General (Internal Medicine)  Diagnosis: High grade urothelial carcinoma T4a, N2, M1  PROCEDURES: 07/16/2014 1. Transrectal prostatic ultrasound with ultrasound-guided prostate  biopsy. 2. Cystoscopy and transurethral resection of large bladder tumor.  PREOPERATIVE DIAGNOSIS: Probable metastatic bladder cancer with prostate nodule.  POSTOPERATIVE DIAGNOSIS: Probable metastatic bladder cancer with prostate nodule.   HISTORY OF PRESENTING ILLNESS:   Jesus [name] 64 y.o. male is here for follow-up of his stage IV urothelial carcinoma. He has completed 1 cycle of carboplatin and Gemzar with excellent tolerance. He states he feels great. He says "I know I got a long way to go but I feel better." He notes less frequent urination and reports he is sleeping better at night because he gets up fewer times to urinate. He has no complaints today. He is here for cycle 2 of therapy. He denies hematuria.    Bladder cancer   07/09/2014 Imaging  CT A/P Irregular and lobular bladder wall thickening suspicious for transitional cell carcinoma. Considerable associated pelvic and retroperitoneal adenopathy. 1.4 by 1.3 cm complex and likely enhancing lesion of the right hepatic lobe dome   07/16/2014 - 07/19/2014 Hospital Admission Hospital admission for TURP. Course complicated by ARF   07/31/4707 Procedure TRANSURETHRAL RESECTION OF BLADDER TUMOR WITH GYRUS (TURBT-GYRUS) CYSTOSCOPY WITH PROSTATE ULTRASOUND AND BIOPSY.  Dr. Irine Seal   08/08/2014 PET scan Irregular bladder wall thickening, compatible with known primary bladder neoplasm. Associated mild left hydroureteronephrosis. Extensive retroperitoneal nodal metastases. Associated pelvic implant, suspicious for peritoneal disease.2.3 metastasis liver    Chemotherapy      MEDICAL HISTORY:  Past Medical History  Diagnosis Date  . High cholesterol   .  Depression     hx of   . Arthritis   . Bladder cancer     SURGICAL HISTORY: Past Surgical History  Procedure Laterality Date  . Knee surgery    . Tonsillectomy    . Transurethral resection of bladder tumor with gyrus (turbt-gyrus) N/A 07/16/2014    Procedure: TRANSURETHRAL RESECTION OF BLADDER TUMOR WITH GYRUS (TURBT-GYRUS);  Surgeon: Malka So, MD;  Location: [facility];  Service: Urology;  Laterality: N/A;  . Cystoscopy with biopsy N/A 07/16/2014    Procedure: CYSTOSCOPY WITH PROSTATE ULTRASOUND AND BIOPSY;  Surgeon: Malka So, MD;  Location: [facility];  Service: Urology;  Laterality: N/A;    SOCIAL HISTORY: History   Social History  . Marital Status: Single    Spouse Name: N/A  . Number of Children: N/A  . Years of Education: N/A   Occupational History  . Not on file.   Social History Main Topics  . Smoking status: Never Smoker   . Smokeless tobacco: Never Used  . Alcohol Use: No  . Drug Use: No  . Sexual Activity: Not on file     Comment: Divorced. 1 son who lives in [place].  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 Non smoker, no tobacco use. No significant ETOH use   Other Topics Concern  . Not on file   Social History Narrative    FAMILY HISTORY: Family History  Problem Relation Age of Onset  . Cancer Mother   . Stroke Father   . Cancer Sister    indicated that his mother is deceased. He indicated that his father is deceased. He indicated that his sister is alive. He indicated that his maternal grandmother is deceased. He indicated  that his maternal grandfather is deceased. He indicated that his paternal grandmother is deceased. He indicated that his paternal grandfather is deceased.  Father died at 55 from a CVA Mother died at 36 from breast cancer, one sister had breast cancer and is still living  ALLERGIES:  has No Known Allergies.  MEDICATIONS:  Current Outpatient Prescriptions  Medication Sig Dispense Refill  . CARBOPLATIN IV Inject into  the vein. Starting 08/23/14: Day 1 every 21 days    . folic acid (V-R FOLIC ACID) 967 MCG tablet Take 1 tablet (400 mcg total) by mouth daily. 30 tablet 6  . Gemcitabine HCl (GEMZAR IV) Inject into the vein. Starting 08/23/14: Day 1 & 8 every 21 days    . lidocaine-prilocaine (EMLA) cream Apply a quarter size amount to port site 1 hour prior to chemo. Do not rub in. Cover with plastic wrap. 30 g 3  . ondansetron (ZOFRAN) 8 MG tablet Take 1 tablet (8 mg total) by mouth every 8 (eight) hours as needed for nausea or vomiting. (Patient not taking: Reported on 09/12/2014) 30 tablet 2  . prochlorperazine (COMPAZINE) 10 MG tablet Take 1 tablet (10 mg total) by mouth every 6 (six) hours as needed for nausea or vomiting. (Patient not taking: Reported on 09/12/2014) 30 tablet 2   No current facility-administered medications for this visit.   Facility-Administered Medications Ordered in Other Visits  Medication Dose Route Frequency Provider Last Rate Last Dose  . 0.9 %  sodium chloride infusion   Intravenous Once Patrici Ranks, MD      . CARBOplatin (PARAPLATIN) 490 mg in sodium chloride 0.9 % 250 mL chemo infusion  490 mg Intravenous Once Patrici Ranks, MD      . Gemcitabine HCl (GEMZAR) 1,444 mg in sodium chloride 0.9 % 100 mL chemo infusion  750 mg/m2 (Treatment Plan Actual) Intravenous Once Patrici Ranks, MD      . heparin lock flush 100 unit/mL  500 Units Intracatheter Once PRN Patrici Ranks, MD      . ondansetron (ZOFRAN) 8 mg, dexamethasone (DECADRON) 20 mg in sodium chloride 0.9 % 50 mL IVPB   Intravenous Once Patrici Ranks, MD      . sodium chloride 0.9 % injection 10 mL  10 mL Intracatheter PRN Patrici Ranks, MD        Review of Systems  Constitutional: Negative for fever, chills, weight loss and malaise/fatigue.  HENT: Negative for congestion, hearing loss, nosebleeds, sore throat and tinnitus.   Eyes: Negative for blurred vision, double vision, pain and discharge.    Respiratory: Negative for cough, hemoptysis, sputum production, shortness of breath and wheezing.   Cardiovascular: Negative for chest pain, palpitations, claudication, leg swelling and PND.  Gastrointestinal: Negative for heartburn, nausea, vomiting, abdominal pain, diarrhea, constipation, blood in stool and melena.  Genitourinary: Negative for dysuria, urgency, frequency and hematuria.  Musculoskeletal: Negative for myalgias, joint pain and falls.  Skin: Negative for itching and rash.  Neurological: Negative for dizziness, tingling, tremors, sensory change, speech change, focal weakness, seizures, loss of consciousness, weakness and headaches.  Endo/Heme/Allergies: Does not bruise/bleed easily.  Psychiatric/Behavioral: Negative for depression, suicidal ideas, memory loss and substance abuse. The patient is not nervous/anxious and does not have insomnia.     PHYSICAL EXAMINATION: ECOG PERFORMANCE STATUS: 1 - Symptomatic but completely ambulatory  Filed Vitals:   09/12/14 0900  BP: 125/73  Pulse: 76  Temp: 99.1 F (37.3 C)  Resp: 14   Filed Weights  09/12/14 0900  Weight: 171 lb (77.565 kg)     Physical Exam  Constitutional: He is oriented to person, place, and time and well-developed, well-nourished, and in no distress.  HENT:  Head: Normocephalic and atraumatic.  Nose: Nose normal.  Mouth/Throat: Oropharynx is clear and moist. No oropharyngeal exudate.  Eyes: Conjunctivae and EOM are normal. Pupils are equal, round, and reactive to light. Right eye exhibits no discharge. Left eye exhibits no discharge. No scleral icterus.  Neck: Normal range of motion. Neck supple. No tracheal deviation present. No thyromegaly present.  Cardiovascular: Normal rate, regular rhythm and normal heart sounds.  Exam reveals no gallop and no friction rub.   No murmur heard. Pulmonary/Chest: Effort normal and breath sounds normal. He has no wheezes. He has no rales.  Abdominal: Soft. Bowel sounds  are normal. He exhibits no distension and no mass. There is no tenderness. There is no rebound and no guarding.  Musculoskeletal: Normal range of motion. He exhibits no edema.  Lymphadenopathy:    He has no cervical adenopathy.  Neurological: He is alert and oriented to person, place, and time. He has normal reflexes. No cranial nerve deficit. Gait normal. Coordination normal.  Skin: Skin is warm and dry. No rash noted.  Psychiatric: Mood and affect normal.  Somewhat abnormal affect, pleasant  Nursing note and vitals reviewed.    LABORATORY DATA:  I have reviewed the data as listed Lab Results  Component Value Date   WBC 4.7 09/12/2014   HGB 12.3* 09/12/2014   HCT 37.6* 09/12/2014   MCV 88.1 09/12/2014   PLT 184 09/12/2014     Chemistry      Component Value Date/Time   NA 137 09/12/2014 0903   K 4.1 09/12/2014 0903   CL 105 09/12/2014 0903   CO2 26 09/12/2014 0903   BUN 18 09/12/2014 0903   CREATININE 1.12 09/12/2014 0903      Component Value Date/Time   CALCIUM 8.8 09/12/2014 0903   ALKPHOS 57 08/23/2014 0951   AST 22 08/23/2014 0951   ALT 12 08/23/2014 0951   BILITOT 0.5 08/23/2014 0951       PATHOLOGY:  Diagnosis 1. Prostate, needle biopsy(ies), right base lateral - BENIGN PROSTATIC GLANDS AND STROMA WITH FEATURES OF ATROPHY. 2. Prostate, needle biopsy(ies), right base medial - BENIGN PROSTATIC GLANDS AND STROMA. 3. 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 Prostate, needle biopsy(ies), right mid medial - HIGH GRADE UROTHELIAL CARCINOMA INVOLVING PROSTATIC GLANDS AND DUCTS. SEE COMMENT. 5. Prostate, needle biopsy(ies), right apex lateral - HIGH GRADE UROTHELIAL CARCINOMA INVOLVING PROSTATIC GLANDS AND DUCTS. SEE COMMENT. 6. Prostate, needle biopsy(ies), right apex medial - HIGH GRADE UROTHELIAL CARCINOMA INVOLVING PROSTATIC GLANDS AND DUCTS, SEE COMMENT. 7. Prostate, needle  biopsy(ies), left base lateral - BENIGN PROSTATIC GLANDS AND STROMA. 8. Prostate, needle biopsy(ies), left base medial - BENIGN PROSTATIC GLANDS AND STROMA. 9. 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 SEE COMMENT. 10. Prostate, needle biopsy(ies), left mid medial - HIGH GRADE UROTHELIAL CARCINOMA INVOLVING PROSTATIC GLANDS AND DUCTS, SEE COMMENT. 11. Prostate, needle biopsy(ies), left apex lateral - BENIGN PROSTATIC GLANDS AND STROMA WITH FEATURES OF ATROPHY. 12. Prostate, needle biopsy(ies), left apex medial - HIGH GRADE UROTHELIAL CARCINOMA INVOLVING PROSTATIC GLANDS AND DUCTS, SEE 1 of 3 FINAL for Haegele, Jesus [name] ([id number]) Diagnosis(continued) COMMENT. 13. Bladder, transurethral resection, bladder neck, bladder tumor, prostate - INVASIVE HIGH GRADE UROTHELIAL CARCINOMA. -  TUMOR INVOLVES SMOOTH MUSCLE. Microscopic Comment 4. , 5, 6, 9, and 10. In each of the parts, the needle core demonstrates involvement of prostatic glands and ducts by high grade epithelial neoplasm demonstrating strong diffuse cytokeratin 903 and p63 immunostain expression consistent with high grade urothelial carcinoma. There is evidence of invasion present in part 9 and areas suspicious for invasion in part 4. These parts were reviewed by Dr Saralyn Pilar who concurs  RADIOGRAPHIC STUDIES: I have personally reviewed the radiological images as listed and agreed with the findings in the report. No results found.   CLINICAL DATA: Intermittent gross hematuria over the past month.  EXAM: 07/09/2014 CT ABDOMEN AND PELVIS WITHOUT AND WITH CONTRAST  IMPRESSION: 1. Irregular and lobular bladder wall thickening suspicious for transitional cell carcinoma. Considerable associated pelvic and retroperitoneal adenopathy. There is mesenteric edema and edema tracking around the urinary bladder and presacral space. 2. Suspected 2 mm left mid  kidney nonobstructive calculus. There is also a 5 mm bladder calculus. 3. I have taking careful density measurements of the apparent parapelvic cyst in the left kidney, and it continues to measure fluid density on all phases, accordingly I doubt that this represents tumor. However, there is some increased density in the adjacent collecting system which could be due to blood products. I do not show a filling defect aside from the parapelvic cyst to constitute evidence of transitional cell carcinoma of the left renal hilum. 4. 1.4 by 1.3 cm complex and likely enhancing lesion of the right hepatic lobe dome is technically nonspecific based on imaging. This could be further assessed with nuclear Medicine PET-CT, or hepatic protocol MRI with and without contrast (using Eovist contrast medium, if feasible). 5. 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   Electronically Signed  By: Sherryl Barters M.D.  On: 07/09/2014 11:27   CLINICAL DATA: Bladder tumor subsequent evaluation. Patient status post transient reach pleural resection of bladder tumor. Elevation of x-ray abdomen. Concern for ureteral obstruction  EXAM: 07/18/2014 CT ABDOMEN AND PELVIS WITHOUT CONTRAST IMPRESSION:  1. Mild bilateral hydronephrosis and hydroureter is increased from prior; however no evidence of high-grade obstruction. 2. Decrease thickening of the posterior bladder wall follow-up surgery. Asymmetric thickening along the left wall. 3. Extensive local iliac lymphadenopathy and left periaortic retroperitoneal metastatic adenopathy. 4. Right middle lobe pulmonary nodule. Consider CT thorax for complete evaluation of lungs.   Electronically Signed  By: Suzy Bouchard M.D.  On: 07/18/2014 09:57  ASSESSMENT & PLAN:   Stage IV bladder cancer  64 year old male with stage IV bladder cancer currently receiving therapy with carboplatin and Gemzar. He has excellent tolerance of treatment. I have dose  reduced his Gemzar to 750 mg/m because of a platelet count 406,000 on day 8 of cycle 1. We will continue to follow his platelets closely moving forward. We will see him next week for additional assessment. If he continues to do well with treatment we will then see him only on day 1 of each cycle. I will plan on reimaging after completion of cycle 3 or cycle 4.  All questions were answered. The patient knows to call the clinic with any problems, questions or concerns.    Molli Hazard, MD MD 09/12/2014 9:52 AM

## 2014-09-12 NOTE — Progress Notes (Signed)
Jesus [name] Tolerated chemotherapy well today.  Discharged ambulatory

## 2014-09-12 NOTE — Patient Instructions (Signed)
[place] at [facility] Discharge Instructions  RECOMMENDATIONS MADE BY THE CONSULTANT AND ANY TEST RESULTS WILL BE SENT TO YOUR REFERRING PHYSICIAN.  Exam and discussion by Dr. Youlanda Roys are doing very well. Report fevers, uncontrolled nausea, vomiting or other concerns  Follow-up in 1 week with chemotherapy and office visit.  Thank you for choosing [place] at [facility] to provide your oncology and hematology care.  To afford each patient quality time with our provider, please arrive at least 15 minutes before your scheduled appointment time.    You need to re-schedule your appointment should you arrive 10 or more minutes late.  We strive to give you quality time with our providers, and arriving late affects you and other patients whose appointments are after yours.  Also, if you no show three or more times for appointments you may be dismissed from the clinic at the providers discretion.     Again, thank you for choosing [facility].  Our hope is that these requests will decrease the amount of time that you wait before being seen by our physicians.       _____________________________________________________________  Should you have questions after your visit to [facility], please contact our office at [phone] between the hours of 8:30 a.m. and 4:30 p.m.  Voicemails left after 4:30 p.m. will not be returned until the following business day.  For prescription refill requests, have your pharmacy contact our office.

## 2014-09-12 NOTE — Patient Instructions (Signed)
[facility] Discharge Instructions for Patients Receiving Chemotherapy  Today you received the following chemotherapy agents gemzar, carbo Please call the clinic if you have any questions or concerns.  Follow up as scheduled.  To help prevent nausea and vomiting after your treatment, we encourage you to take your nausea medication    If you develop nausea and vomiting that is not controlled by your nausea medication, call the clinic. If it is after clinic hours your family physician or the after hours number for the clinic or go to the Emergency Department.   BELOW ARE SYMPTOMS THAT SHOULD BE REPORTED IMMEDIATELY:  *FEVER GREATER THAN 101.0 F  *CHILLS WITH OR WITHOUT FEVER  NAUSEA AND VOMITING THAT IS NOT CONTROLLED WITH YOUR NAUSEA MEDICATION  *UNUSUAL SHORTNESS OF BREATH  *UNUSUAL BRUISING OR BLEEDING  TENDERNESS IN MOUTH AND THROAT WITH OR WITHOUT PRESENCE OF ULCERS  *URINARY PROBLEMS  *BOWEL PROBLEMS  UNUSUAL RASH Items with * indicate a potential emergency and should be followed up as soon as possible.  One of the nurses will contact you 24 hours after your treatment. Please let the nurse know about any problems that you may have experienced. Feel free to call the clinic you have any questions or concerns. The clinic phone number is [phone].   I have been informed and understand all the instructions given to me. I know to contact the clinic, my physician, or go to the Emergency Department if any problems should occur. I do not have any questions at this time, but understand that I may call the clinic during office hours or the Patient Navigator at [phone] should I have any questions or need assistance in obtaining follow up care.

## 2014-09-19 ENCOUNTER — Encounter (HOSPITAL_COMMUNITY): Payer: Self-pay | Admitting: Oncology

## 2014-09-19 ENCOUNTER — Encounter (HOSPITAL_BASED_OUTPATIENT_CLINIC_OR_DEPARTMENT_OTHER): Payer: Commercial Managed Care - HMO

## 2014-09-19 ENCOUNTER — Encounter (HOSPITAL_BASED_OUTPATIENT_CLINIC_OR_DEPARTMENT_OTHER): Payer: Commercial Managed Care - HMO | Admitting: Oncology

## 2014-09-19 DIAGNOSIS — C7910 Secondary malignant neoplasm of unspecified urinary organs: Secondary | ICD-10-CM | POA: Diagnosis not present

## 2014-09-19 DIAGNOSIS — C679 Malignant neoplasm of bladder, unspecified: Secondary | ICD-10-CM | POA: Diagnosis not present

## 2014-09-19 LAB — CBC WITH DIFFERENTIAL/PLATELET
BASOS PCT: 0 % (ref 0–1)
Basophils Absolute: 0 10*3/uL (ref 0.0–0.1)
EOS ABS: 0 10*3/uL (ref 0.0–0.7)
Eosinophils Relative: 1 % (ref 0–5)
HCT: 35 % — ABNORMAL LOW (ref 39.0–52.0)
HEMOGLOBIN: 11.3 g/dL — AB (ref 13.0–17.0)
LYMPHS ABS: 1.8 10*3/uL (ref 0.7–4.0)
Lymphocytes Relative: 58 % — ABNORMAL HIGH (ref 12–46)
MCH: 28.4 pg (ref 26.0–34.0)
MCHC: 32.3 g/dL (ref 30.0–36.0)
MCV: 87.9 fL (ref 78.0–100.0)
MONOS PCT: 6 % (ref 3–12)
Monocytes Absolute: 0.2 10*3/uL (ref 0.1–1.0)
Neutro Abs: 1.1 10*3/uL — ABNORMAL LOW (ref 1.7–7.7)
Neutrophils Relative %: 35 % — ABNORMAL LOW (ref 43–77)
Platelets: 194 10*3/uL (ref 150–400)
RBC: 3.98 MIL/uL — ABNORMAL LOW (ref 4.22–5.81)
RDW: 14.4 % (ref 11.5–15.5)
WBC: 3.1 10*3/uL — ABNORMAL LOW (ref 4.0–10.5)

## 2014-09-19 LAB — BASIC METABOLIC PANEL
Anion gap: 8 (ref 5–15)
BUN: 26 mg/dL — AB (ref 6–23)
CO2: 24 mmol/L (ref 19–32)
Calcium: 8.7 mg/dL (ref 8.4–10.5)
Chloride: 107 mmol/L (ref 96–112)
Creatinine, Ser: 1.24 mg/dL (ref 0.50–1.35)
GFR, EST AFRICAN AMERICAN: 71 mL/min — AB (ref >90)
GFR, EST NON AFRICAN AMERICAN: 61 mL/min — AB (ref >90)
Glucose, Bld: 113 mg/dL — ABNORMAL HIGH (ref 70–99)
Potassium: 3.7 mmol/L (ref 3.5–5.1)
SODIUM: 139 mmol/L (ref 135–145)

## 2014-09-19 MED ORDER — SODIUM CHLORIDE 0.9 % IV SOLN
Freq: Once | INTRAVENOUS | Status: AC
  Administered 2014-09-19: 10:00:00 via INTRAVENOUS

## 2014-09-19 MED ORDER — HEPARIN SOD (PORK) LOCK FLUSH 100 UNIT/ML IV SOLN
INTRAVENOUS | Status: AC
  Filled 2014-09-19: qty 5

## 2014-09-19 MED ORDER — SODIUM CHLORIDE 0.9 % IV SOLN: Freq: Once | INTRAVENOUS | Status: DC

## 2014-09-19 MED ORDER — HEPARIN SOD (PORK) LOCK FLUSH 100 UNIT/ML IV SOLN
500.0000 [IU] | Freq: Once | INTRAVENOUS | Status: AC
  Administered 2014-09-19: 500 [IU] via INTRAVENOUS

## 2014-09-19 NOTE — Progress Notes (Signed)
[place] [address]; T. Sheldon Silvan, PA notified. Will hold chemotherapy today.

## 2014-09-19 NOTE — Patient Instructions (Signed)
[place] at [facility] Discharge Instructions  RECOMMENDATIONS MADE BY THE CONSULTANT AND ANY TEST RESULTS WILL BE SENT TO YOUR REFERRING PHYSICIAN. Exam and discussion by Robynn Pane, PA-C. Will not give chemotherapy today because your white blood cell count is too low.  Avoid crowds, people that are sick, etc. Because you are at increased risk of getting an infections  Will schedule Day 8 of chemotherapy for next week and if your blood counts are ok you will be treated.  Follow-up with exam in 3 weeks.  Thank you for choosing [place] at [facility] to provide your oncology and hematology care.  To afford each patient quality time with our provider, please arrive at least 15 minutes before your scheduled appointment time.    You need to re-schedule your appointment should you arrive 10 or more minutes late.  We strive to give you quality time with our providers, and arriving late affects you and other patients whose appointments are after yours.  Also, if you no show three or more times for appointments you may be dismissed from the clinic at the providers discretion.     Again, thank you for choosing [facility].  Our hope is that these requests will decrease the amount of time that you wait before being seen by our physicians.       _____________________________________________________________  Should you have questions after your visit to [facility], please contact our office at [phone] between the hours of 8:30 a.m. and 4:30 p.m.  Voicemails left after 4:30 p.m. will not be returned until the following business day.  For prescription refill requests, have your pharmacy contact our office.

## 2014-09-19 NOTE — Assessment & Plan Note (Addendum)
64 year old male with stage IV bladder cancer currently receiving therapy with carboplatin and Gemzar with a Gemzar dose reduction to 750 mg/m because of a platelet count on day 8 cycle 1.  He has excellent tolerance of treatment thus far.  Pre-chemo labs today demonstrate an ANC of 1.1.  Since treatment is purely palliative in nature, treatment today is unreasonable given the fact that further treatment-induced neutropenia is likely and risks associated with that are significant.  I provided patient education regarding chemotherapy-induced neutropenia and potential complications associated with that if treated today.  He is agreeable to hold treatment today and try again next week.    Return in 3 weeks for follow-up appointment and to embark on cycle 3 of therapy.  He will be due for restaging imaging after the completion of cycle 3 or cycle 4. This can be determined at his next follow-up appointment and ordered and scheduled.

## 2014-09-19 NOTE — Patient Instructions (Addendum)
[facility] Discharge Instructions for Patients Receiving Chemotherapy  Today your treatment was held due to a low white blood cell count. If you develop nausea and vomiting, or diarrhea that is not controlled by your medication, call the clinic.  The clinic phone number is [phone]. Office hours are Monday-Friday 8:30am-5:00pm.  BELOW ARE SYMPTOMS THAT SHOULD BE REPORTED IMMEDIATELY:  *FEVER GREATER THAN 101.0 F  *CHILLS WITH OR WITHOUT FEVER  NAUSEA AND VOMITING THAT IS NOT CONTROLLED WITH YOUR NAUSEA MEDICATION  *UNUSUAL SHORTNESS OF BREATH  *UNUSUAL BRUISING OR BLEEDING  TENDERNESS IN MOUTH AND THROAT WITH OR WITHOUT PRESENCE OF ULCERS  *URINARY PROBLEMS  *BOWEL PROBLEMS  UNUSUAL RASH Items with * indicate a potential emergency and should be followed up as soon as possible. If you have an emergency after office hours please contact your primary care physician or go to the nearest emergency department.  Please call the clinic during office hours if you have any questions or concerns.   You may also contact the Patient Navigator at [phone] should you have any questions or need assistance in obtaining follow up care. _____________________________________________________________________ Have you asked about our STAR program?    STAR stands for Survivorship Training and Rehabilitation, and this is a nationally recognized cancer care program that focuses on survivorship and rehabilitation.  Cancer and cancer treatments may cause problems, such as, pain, making you feel tired and keeping you from doing the things that you need or want to do. Cancer rehabilitation can help. Our goal is to reduce these troubling effects and help you have the best quality of life possible.  You may receive a survey from a nurse that asks questions about your current state of health.  Based on the survey results, all eligible patients will be referred to the [facility] program for  an evaluation so we can better serve you! A frequently asked questions sheet is available upon request.          Neutropenia Neutropenia is a condition that occurs when the level of a certain type of white blood cell (neutrophil) in your body becomes lower than normal. Neutrophils are made in the bone marrow and fight infections. These cells protect against bacteria and viruses. The fewer neutrophils you have, and the longer your body remains without them, the greater your risk of getting a severe infection becomes. CAUSES  The cause of neutropenia may be hard to determine. However, it is usually due to 3 main problems:   Decreased production of neutrophils. This may be due to:  Certain medicines such as chemotherapy.  Genetic problems.  Cancer.  Radiation treatments.  Vitamin deficiency.  Some pesticides.  Increased destruction of neutrophils. This may be due to:  Overwhelming infections.  Hemolytic anemia. This is when the body destroys its own blood cells.  Chemotherapy.  Neutrophils moving to areas of the body where they cannot fight infections. This may be due to:  Dialysis procedures.  Conditions where the spleen becomes enlarged. Neutrophils are held in the spleen and are not available to the rest of the body.  Overwhelming infections. The neutrophils are held in the area of the infection and are not available to the rest of the body. SYMPTOMS  There are no specific symptoms of neutropenia. The lack of neutrophils can result in an infection, and an infection can cause various problems. DIAGNOSIS  Diagnosis is made by a blood test. A complete blood count is performed. The normal level of neutrophils in  human blood differs with age and race. Infants have lower counts than older children and adults. African Americans have lower counts than Caucasians or Asians. The average adult level is 1500 cells/mm3 of blood. Neutrophil counts are interpreted as  follows:  Greater than 1000 cells/mm3 gives normal protection against infection.  500 to 1000 cells/mm3 gives an increased risk for infection.  200 to 500 cells/mm3 is a greater risk for severe infection.  Lower than 200 cells/mm3 is a marked risk of infection. This may require hospitalization and treatment with antibiotic medicines. TREATMENT  Treatment depends on the underlying cause, severity, and presence of infections or symptoms. It also depends on your health. Your caregiver will discuss the treatment plan with you. Mild cases are often easily treated and have a good outcome. Preventative measures may also be started to limit your risk of infections. Treatment can include:  Taking antibiotics.  Stopping medicines that are known to cause neutropenia.  Correcting nutritional deficiencies by eating green vegetables to supply folic acid and taking vitamin B supplements.  Stopping exposure to pesticides if your neutropenia is related to pesticide exposure.  Taking a blood growth factor called sargramostim, pegfilgrastim, or filgrastim if you are undergoing chemotherapy for cancer. This stimulates white blood cell production.  Removal of the spleen if you have Felty's syndrome and have repeated infections. HOME CARE INSTRUCTIONS   Follow your caregiver's instructions about when you need to have blood work done.  Wash your hands often. Make sure others who come in contact with you also wash their hands.  Wash raw fruits and vegetables before eating them. They can carry bacteria and fungi.  Avoid people with colds or spreadable (contagious) diseases (chickenpox, herpes zoster, influenza).  Avoid large crowds.  Avoid construction areas. The dust can release fungus into the air.  Be cautious around children in daycare or school environments.  Take care of your respiratory system by coughing and deep breathing.  Bathe daily.  Protect your skin from cuts and burns.  Do not  work in the garden or with flowers and plants.  Care for the mouth before and after meals by brushing with a soft toothbrush. If you have mucositis, do not use mouthwash. Mouthwash contains alcohol and can dry out the mouth even more.  Clean the area between the genitals and the anus (perineal area) after urination and bowel movements. Women need to wipe from front to back.  Use a water soluble lubricant during sexual intercourse and practice good hygiene after. Do not have intercourse if you are severely neutropenic. Check with your caregiver for guidelines.  Exercise daily as tolerated.  Avoid people who were vaccinated with a live vaccine in the past 30 days. You should not receive live vaccines (polio, typhoid).  Do not provide direct care for pets. Avoid animal droppings. Do not clean litter boxes and bird cages.  Do not share food utensils.  Do not use tampons, enemas, or rectal suppositories unless directed by your caregiver.  Use an electric razor to remove hair.  Wash your hands after handling magazines, letters, and newspapers. SEEK IMMEDIATE MEDICAL CARE IF:   You have a fever.  You have chills or start to shake.  You feel nauseous or vomit.  You develop mouth sores.  You develop aches and pains.  You have redness and swelling around open wounds.  Your skin is warm to the touch.  You have pus coming from your wounds.  You develop swollen lymph nodes.  You feel weak  or fatigued.  You develop red streaks on the skin. MAKE SURE YOU:  Understand these instructions.  Will watch your condition.  Will get help right away if you are not doing well or get worse. Document Released: 11/27/2001 Document Revised: 08/30/2011 Document Reviewed: 12/25/2010 [facility] Patient Information 2015 [place], [place]. This information is not intended to replace advice given to you by your health care provider. Make sure you discuss any questions you have with your health care  provider.

## 2014-09-19 NOTE — Progress Notes (Signed)
Jesus Cahill, MD  [place] [address]  Metastatic urothelial carcinoma  CURRENT THERAPY: Carboplatin/Gemzar  INTERVAL HISTORY: Jesus [name] 64 y.o. male returns for followup of stage IV urothelial carcinoma.      Metastatic urothelial carcinoma   07/09/2014 Imaging  CT A/P Irregular and lobular bladder wall thickening suspicious for transitional cell carcinoma. Considerable associated pelvic and retroperitoneal adenopathy. 1.4 by 1.3 cm complex and likely enhancing lesion of the right hepatic lobe dome   07/16/2014 - 07/19/2014 Hospital Admission Hospital admission for TURP. Course complicated by ARF   3/82/[id number] Procedure TRANSURETHRAL RESECTION OF BLADDER TUMOR WITH GYRUS (TURBT-GYRUS) CYSTOSCOPY WITH PROSTATE ULTRASOUND AND BIOPSY.  Dr. Irine Seal   08/08/2014 PET scan Irregular bladder wall thickening, compatible with known primary bladder neoplasm. Associated mild left hydroureteronephrosis. Extensive retroperitoneal nodal metastases. Associated pelvic implant, suspicious for peritoneal disease.2.3 metastasis liver   08/23/2014 -  Chemotherapy Carboplatin and Gemzar day 1 and 8 given in the palliative setting    I personally reviewed and went over laboratory results with the patient.  The results are noted within this dictation.  ANC is 1.1 and he is getting treated in the palliative setting.  Therefore, I will defer treatment today by 7 days.  He reports that he is feeling great.  He is tolerating therapy without any difficulties.    I provided him education regarding his chemotherapy-induced neutropenia and the need to defer chemotherapy.  Given the fact that treatment is palliative in nature only, chemotherapy today with the high risk of causing neutropenia and complications associated with neutropenia is unnecessary.  Therefore, I will defer treatment today x 7 days.  He is educated regarding the role of neutrophils and potential complications associated  with neutropenia.  He is agreeable to this plan of action.    Oncologically, he denies any complaints and ROS questioning is negative.  Past Medical History  Diagnosis Date  . High cholesterol   . Depression     hx of   . Arthritis   . Bladder cancer   . Metastatic urothelial carcinoma 07/16/2014    has Metastatic urothelial carcinoma; Acute renal insufficiency; and Iron deficiency anemia due to chronic blood loss on his problem list.     has No Known Allergies.  Mr. Basquez had no medications administered during this visit.  Past Surgical History  Procedure Laterality Date  . Knee surgery    . Tonsillectomy    . Transurethral resection of bladder tumor with gyrus (turbt-gyrus) N/A 07/16/2014    Procedure: TRANSURETHRAL RESECTION OF BLADDER TUMOR WITH GYRUS (TURBT-GYRUS);  Surgeon: Malka So, MD;  Location: [facility];  Service: Urology;  Laterality: N/A;  . Cystoscopy with biopsy N/A 07/16/2014    Procedure: CYSTOSCOPY WITH PROSTATE ULTRASOUND AND BIOPSY;  Surgeon: Malka So, MD;  Location: [facility];  Service: Urology;  Laterality: N/A;    Denies any headaches, dizziness, double vision, fevers, chills, night sweats, nausea, vomiting, diarrhea, constipation, chest pain, heart palpitations, shortness of breath, blood in stool, black tarry stool, urinary pain, urinary burning, urinary frequency, hematuria.   PHYSICAL EXAMINATION  ECOG PERFORMANCE STATUS: 1 - Symptomatic but completely ambulatory  There were no vitals filed for this visit.  GENERAL:alert, no distress, well nourished, well developed, comfortable, cooperative, smiling and mentally slow, in chemotherapy recliner receiving IV fluids (KVO) in preparation for chemotherapy SKIN: skin color, texture, turgor are normal, no rashes or significant lesions HEAD: Normocephalic, No  masses, lesions, tenderness or abnormalities EYES: normal, PERRLA, EOMI, Conjunctiva are pink and non-injected EARS: External ears  normal OROPHARYNX:lips, buccal mucosa, and tongue normal and mucous membranes are moist  NECK: supple, no adenopathy, thyroid normal size, non-tender, without nodularity, trachea midline LYMPH:  no palpable lymphadenopathy BREAST:not examined LUNGS: clear to auscultation  HEART: regular rate & rhythm ABDOMEN:abdomen soft, non-tender and normal bowel sounds BACK: Back symmetric, no curvature., No CVA tenderness EXTREMITIES:less then 2 second capillary refill, no joint deformities, effusion, or inflammation, no skin discoloration, no cyanosis  NEURO: alert & oriented x 3 with fluent speech, no focal motor/sensory deficits   LABORATORY DATA: CBC    Component Value Date/Time   WBC 3.1* 09/19/2014 0929   RBC 3.98* 09/19/2014 0929   HGB 11.3* 09/19/2014 0929   HCT 35.0* 09/19/2014 0929   PLT 194 09/19/2014 0929   MCV 87.9 09/19/2014 0929   MCH 28.4 09/19/2014 0929   MCHC 32.3 09/19/2014 0929   RDW 14.4 09/19/2014 0929   LYMPHSABS 1.8 09/19/2014 0929   MONOABS 0.2 09/19/2014 0929   EOSABS 0.0 09/19/2014 0929   BASOSABS 0.0 09/19/2014 0929      Chemistry      Component Value Date/Time   NA 139 09/19/2014 0929   K 3.7 09/19/2014 0929   CL 107 09/19/2014 0929   CO2 24 09/19/2014 0929   BUN 26* 09/19/2014 0929   CREATININE 1.24 09/19/2014 0929      Component Value Date/Time   CALCIUM 8.7 09/19/2014 0929   ALKPHOS 57 08/23/2014 0951   AST 22 08/23/2014 0951   ALT 12 08/23/2014 0951   BILITOT 0.5 08/23/2014 0951       RADIOGRAPHIC STUDIES:  Ir Fluoro Guide Cv Line Right  08/21/2014   CLINICAL DATA:  64 year old male with bladder cancer in need of durable central venous access for chemotherapy.  EXAM: IR RIGHT FLOURO GUIDE CV LINE; IR ULTRASOUND GUIDANCE VASC ACCESS RIGHT  Date: 08/21/2014  ANESTHESIA/SEDATION: Moderate (conscious) sedation was administered during this procedure. A total of 2 mg Versed and 75 mg Fentanyl were administered intravenously. The patient's vital signs  were monitored continuously by radiology nursing throughout the course of the procedure.  Total sedation time: 25 minutes  FLUOROSCOPY TIME:  6 seconds  1 mGy  TECHNIQUE: The right neck and chest was prepped with chlorhexidine, and draped in the usual sterile fashion using maximum barrier technique (cap and mask, sterile gown, sterile gloves, large sterile sheet, hand hygiene and cutaneous antiseptic). Antibiotic prophylaxis was provided with 2g Ancef administered IV one hour prior to skin incision. Local anesthesia was attained by infiltration with 1% lidocaine with epinephrine.  Ultrasound demonstrated patency of the right internal jugular vein, and this was documented with an image. Under real-time ultrasound guidance, this vein was accessed with a 21 gauge micropuncture needle and image documentation was performed. A small dermatotomy was made at the access site with an 11 scalpel. A 0.018" wire was advanced into the SVC and the access needle exchanged for a 36F micropuncture vascular sheath. The 0.018" wire was then removed and a 0.035" wire advanced into the IVC.  An appropriate location for the subcutaneous reservoir was selected below the clavicle and an incision was made through the skin and underlying soft tissues. The subcutaneous tissues were then dissected using a combination of blunt and sharp surgical technique and a pocket was formed. A lumen power injectable portacatheter was then tunneled through the subcutaneous tissues from the pocket to the dermatotomy and  the port reservoir placed within the subcutaneous pocket.  The venous access site was then serially dilated and a peel away vascular sheath placed over the wire. The wire was removed and the port catheter advanced into position under fluoroscopic guidance. The catheter tip is positioned in the upper right atrium. This was documented with a spot image. The portacatheter was then tested and found to flush and aspirate well. The port was flushed  with saline followed by 100 units/mL heparinized saline.  The pocket was then closed in two layers using first subdermal inverted interrupted absorbable sutures followed by a running subcuticular suture. The epidermis was then sealed with Dermabond. The dermatotomy at the venous access site was also closed with a single inverted subdermal suture and the epidermis sealed with Dermabond.  COMPLICATIONS: None.  The patient tolerated the procedure well.  IMPRESSION: Successful placement of a right IJ approach Power Port with ultrasound and fluoroscopic guidance. The catheter is ready for use.  Signed,  Criselda Peaches, MD  [facility]  [facility]   Electronically Signed   By: Jacqulynn Cadet M.D.   On: 08/21/2014 17:36   Ir US Guide Vasc Access Right  08/21/2014   CLINICAL DATA:  64 year old male with bladder cancer in need of durable central venous access for chemotherapy.  EXAM: IR RIGHT FLOURO GUIDE CV LINE; IR ULTRASOUND GUIDANCE VASC ACCESS RIGHT  Date: 08/21/2014  ANESTHESIA/SEDATION: Moderate (conscious) sedation was administered during this procedure. A total of 2 mg Versed and 75 mg Fentanyl were administered intravenously. The patient's vital signs were monitored continuously by radiology nursing throughout the course of the procedure.  Total sedation time: 25 minutes  FLUOROSCOPY TIME:  6 seconds  1 mGy  TECHNIQUE: The right neck and chest was prepped with chlorhexidine, and draped in the usual sterile fashion using maximum barrier technique (cap and mask, sterile gown, sterile gloves, large sterile sheet, hand hygiene and cutaneous antiseptic). Antibiotic prophylaxis was provided with 2g Ancef administered IV one hour prior to skin incision. Local anesthesia was attained by infiltration with 1% lidocaine with epinephrine.  Ultrasound demonstrated patency of the right internal jugular vein, and this was documented with an image. Under real-time ultrasound  guidance, this vein was accessed with a 21 gauge micropuncture needle and image documentation was performed. A small dermatotomy was made at the access site with an 11 scalpel. A 0.018" wire was advanced into the SVC and the access needle exchanged for a 97F micropuncture vascular sheath. The 0.018" wire was then removed and a 0.035" wire advanced into the IVC.  An appropriate location for the subcutaneous reservoir was selected below the clavicle and an incision was made through the skin and underlying soft tissues. The subcutaneous tissues were then dissected using a combination of blunt and sharp surgical technique and a pocket was formed. A lumen power injectable portacatheter was then tunneled through the subcutaneous tissues from the pocket to the dermatotomy and the port reservoir placed within the subcutaneous pocket.  The venous access site was then serially dilated and a peel away vascular sheath placed over the wire. The wire was removed and the port catheter advanced into position under fluoroscopic guidance. The catheter tip is positioned in the upper right atrium. This was documented with a spot image. The portacatheter was then tested and found to flush and aspirate well. The port was flushed with saline followed by 100 units/mL heparinized saline.  The pocket was then closed in two layers using  first subdermal inverted interrupted absorbable sutures followed by a running subcuticular suture. The epidermis was then sealed with Dermabond. The dermatotomy at the venous access site was also closed with a single inverted subdermal suture and the epidermis sealed with Dermabond.  COMPLICATIONS: None.  The patient tolerated the procedure well.  IMPRESSION: Successful placement of a right IJ approach Power Port with ultrasound and fluoroscopic guidance. The catheter is ready for use.  Signed,  Criselda Peaches, MD  [facility]  [facility]   Electronically  Signed   By: Jacqulynn Cadet M.D.   On: 08/21/2014 17:36     ASSESSMENT AND PLAN:  Metastatic urothelial carcinoma 64 year old male with stage IV bladder cancer currently receiving therapy with carboplatin and Gemzar with a Gemzar dose reduction to 750 mg/m because of a platelet count on day 8 cycle 1.  He has excellent tolerance of treatment thus far.  Pre-chemo labs today demonstrate an ANC of 1.1.  Since treatment is purely palliative in nature, treatment today is unreasonable given the fact that further treatment-induced neutropenia is likely and risks associated with that are significant.  I provided patient education regarding chemotherapy-induced neutropenia and potential complications associated with that if treated today.  He is agreeable to hold treatment today and try again next week.    Return in 3 weeks for follow-up appointment and to embark on cycle 3 of therapy.  He will be due for restaging imaging after the completion of cycle 3 or cycle 4. This can be determined at his next follow-up appointment and ordered and scheduled.       THERAPY PLAN:  Continue therapy as planned with deferring day 8 day x 7 days due to [place] of 1.1 and treatment is purely palliative.  Restaging scans following completion of cycle 3 or 4.  All questions were answered. The patient knows to call the clinic with any problems, questions or concerns. We can certainly see the patient much sooner if necessary.  Patient and plan discussed with Dr. Ancil Linsey and she is in agreement with the aforementioned.   This note is electronically signed by: Robynn Pane 09/19/2014 11:11 AM

## 2014-09-23 ENCOUNTER — Encounter (HOSPITAL_COMMUNITY): Payer: Self-pay | Admitting: Hematology & Oncology

## 2014-09-26 ENCOUNTER — Encounter (HOSPITAL_COMMUNITY): Payer: Self-pay

## 2014-09-26 ENCOUNTER — Encounter (HOSPITAL_COMMUNITY): Payer: Commercial Managed Care - HMO | Attending: Hematology & Oncology

## 2014-09-26 VITALS — BP 149/86 | HR 70 | Temp 97.5°F | Resp 20 | Wt 173.0 lb

## 2014-09-26 DIAGNOSIS — Z5111 Encounter for antineoplastic chemotherapy: Secondary | ICD-10-CM

## 2014-09-26 DIAGNOSIS — C7910 Secondary malignant neoplasm of unspecified urinary organs: Secondary | ICD-10-CM

## 2014-09-26 DIAGNOSIS — C679 Malignant neoplasm of bladder, unspecified: Secondary | ICD-10-CM | POA: Diagnosis present

## 2014-09-26 LAB — CBC WITH DIFFERENTIAL/PLATELET
Basophils Absolute: 0 10*3/uL (ref 0.0–0.1)
Basophils Relative: 1 % (ref 0–1)
Eosinophils Absolute: 0 10*3/uL (ref 0.0–0.7)
Eosinophils Relative: 1 % (ref 0–5)
HCT: 35.4 % — ABNORMAL LOW (ref 39.0–52.0)
HEMOGLOBIN: 11.3 g/dL — AB (ref 13.0–17.0)
LYMPHS ABS: 1.8 10*3/uL (ref 0.7–4.0)
LYMPHS PCT: 38 % (ref 12–46)
MCH: 28.4 pg (ref 26.0–34.0)
MCHC: 31.9 g/dL (ref 30.0–36.0)
MCV: 88.9 fL (ref 78.0–100.0)
MONO ABS: 0.5 10*3/uL (ref 0.1–1.0)
MONOS PCT: 10 % (ref 3–12)
NEUTROS ABS: 2.5 10*3/uL (ref 1.7–7.7)
Neutrophils Relative %: 52 % (ref 43–77)
Platelets: 112 10*3/uL — ABNORMAL LOW (ref 150–400)
RBC: 3.98 MIL/uL — ABNORMAL LOW (ref 4.22–5.81)
RDW: 15.9 % — ABNORMAL HIGH (ref 11.5–15.5)
WBC: 4.8 10*3/uL (ref 4.0–10.5)

## 2014-09-26 LAB — COMPREHENSIVE METABOLIC PANEL
ALK PHOS: 65 U/L (ref 39–117)
ALT: 43 U/L (ref 0–53)
ANION GAP: 6 (ref 5–15)
AST: 36 U/L (ref 0–37)
Albumin: 4 g/dL (ref 3.5–5.2)
BUN: 22 mg/dL (ref 6–23)
CALCIUM: 8.9 mg/dL (ref 8.4–10.5)
CO2: 25 mmol/L (ref 19–32)
CREATININE: 1.14 mg/dL (ref 0.50–1.35)
Chloride: 107 mmol/L (ref 96–112)
GFR calc Af Amer: 78 mL/min — ABNORMAL LOW (ref >90)
GFR calc non Af Amer: 68 mL/min — ABNORMAL LOW (ref >90)
GLUCOSE: 106 mg/dL — AB (ref 70–99)
Potassium: 4.1 mmol/L (ref 3.5–5.1)
Sodium: 138 mmol/L (ref 135–145)
Total Bilirubin: 0.5 mg/dL (ref 0.3–1.2)
Total Protein: 6.8 g/dL (ref 6.0–8.3)

## 2014-09-26 MED ORDER — SODIUM CHLORIDE 0.9 % IJ SOLN
10.0000 mL | INTRAMUSCULAR | Status: DC | PRN
  Administered 2014-09-26: 10 mL
  Filled 2014-09-26: qty 10

## 2014-09-26 MED ORDER — HEPARIN SOD (PORK) LOCK FLUSH 100 UNIT/ML IV SOLN
500.0000 [IU] | Freq: Once | INTRAVENOUS | Status: AC | PRN
  Administered 2014-09-26: 500 [IU]
  Filled 2014-09-26 (×2): qty 5

## 2014-09-26 MED ORDER — SODIUM CHLORIDE 0.9 % IV SOLN: 8.0000 mg | Freq: Once | INTRAVENOUS | Status: DC

## 2014-09-26 MED ORDER — DEXAMETHASONE SODIUM PHOSPHATE 10 MG/ML IJ SOLN: 20.0000 mg | Freq: Once | INTRAMUSCULAR | Status: DC

## 2014-09-26 MED ORDER — SODIUM CHLORIDE 0.9 % IV SOLN
Freq: Once | INTRAVENOUS | Status: AC
  Administered 2014-09-26: 8 mg via INTRAVENOUS
  Filled 2014-09-26: qty 4

## 2014-09-26 MED ORDER — SODIUM CHLORIDE 0.9 % IV SOLN
750.0000 mg/m2 | Freq: Once | INTRAVENOUS | Status: AC
  Administered 2014-09-26: 1444 mg via INTRAVENOUS
  Filled 2014-09-26: qty 37.98

## 2014-09-26 MED ORDER — SODIUM CHLORIDE 0.9 % IV SOLN
Freq: Once | INTRAVENOUS | Status: AC
  Administered 2014-09-26: 10:00:00 via INTRAVENOUS

## 2014-09-26 NOTE — Progress Notes (Signed)
Tolerated infusion well. 

## 2014-09-26 NOTE — Patient Instructions (Signed)
..  [facility] Discharge Instructions for Patients Receiving Chemotherapy  Today you received the following chemotherapy agents  Gemcitabine   We will change your chemo to every 2 weeks  To help prevent nausea and vomiting after your treatment, we encourage you to take your nausea medication {If you develop nausea and vomiting, or diarrhea that is not controlled by your medication, call the clinic.  The clinic phone number is [phone]. Office hours are Monday-Friday 8:30am-5:00pm.  BELOW ARE SYMPTOMS THAT SHOULD BE REPORTED IMMEDIATELY:  *FEVER GREATER THAN 101.0 F  *CHILLS WITH OR WITHOUT FEVER  NAUSEA AND VOMITING THAT IS NOT CONTROLLED WITH YOUR NAUSEA MEDICATION  *UNUSUAL SHORTNESS OF BREATH  *UNUSUAL BRUISING OR BLEEDING  TENDERNESS IN MOUTH AND THROAT WITH OR WITHOUT PRESENCE OF ULCERS  *URINARY PROBLEMS  *BOWEL PROBLEMS  UNUSUAL RASH Items with * indicate a potential emergency and should be followed up as soon as possible. If you have an emergency after office hours please contact your primary care physician or go to the nearest emergency department.  Please call the clinic during office hours if you have any questions or concerns.   You may also contact the Patient Navigator at [phone] should you have any questions or need assistance in obtaining follow up care. _____________________________________________________________________ Have you asked about our STAR program?    STAR stands for Survivorship Training and Rehabilitation, and this is a nationally recognized cancer care program that focuses on survivorship and rehabilitation.  Cancer and cancer treatments may cause problems, such as, pain, making you feel tired and keeping you from doing the things that you need or want to do. Cancer rehabilitation can help. Our goal is to reduce these troubling effects and help you have the best quality of life possible.  You may receive a survey from a  nurse that asks questions about your current state of health.  Based on the survey results, all eligible patients will be referred to the [facility] program for an evaluation so we can better serve you! A frequently asked questions sheet is available upon request.

## 2014-10-03 ENCOUNTER — Inpatient Hospital Stay (HOSPITAL_COMMUNITY): Payer: Commercial Managed Care - HMO

## 2014-10-10 ENCOUNTER — Encounter (HOSPITAL_BASED_OUTPATIENT_CLINIC_OR_DEPARTMENT_OTHER): Payer: Commercial Managed Care - HMO | Admitting: Oncology

## 2014-10-10 ENCOUNTER — Other Ambulatory Visit (HOSPITAL_COMMUNITY): Payer: Self-pay | Admitting: Hematology & Oncology

## 2014-10-10 ENCOUNTER — Encounter (HOSPITAL_BASED_OUTPATIENT_CLINIC_OR_DEPARTMENT_OTHER): Payer: Commercial Managed Care - HMO

## 2014-10-10 VITALS — BP 140/87 | HR 73 | Temp 98.3°F | Resp 16 | Wt 173.2 lb

## 2014-10-10 VITALS — BP 133/78 | HR 57 | Temp 97.5°F | Resp 16

## 2014-10-10 DIAGNOSIS — C7910 Secondary malignant neoplasm of unspecified urinary organs: Secondary | ICD-10-CM | POA: Diagnosis not present

## 2014-10-10 DIAGNOSIS — Z5111 Encounter for antineoplastic chemotherapy: Secondary | ICD-10-CM

## 2014-10-10 DIAGNOSIS — C679 Malignant neoplasm of bladder, unspecified: Secondary | ICD-10-CM

## 2014-10-10 LAB — CBC WITH DIFFERENTIAL/PLATELET
Basophils Absolute: 0 10*3/uL (ref 0.0–0.1)
Basophils Relative: 0 % (ref 0–1)
EOS PCT: 3 % (ref 0–5)
Eosinophils Absolute: 0.1 10*3/uL (ref 0.0–0.7)
HEMATOCRIT: 36.3 % — AB (ref 39.0–52.0)
Hemoglobin: 11.8 g/dL — ABNORMAL LOW (ref 13.0–17.0)
LYMPHS ABS: 1.7 10*3/uL (ref 0.7–4.0)
Lymphocytes Relative: 34 % (ref 12–46)
MCH: 29.7 pg (ref 26.0–34.0)
MCHC: 32.5 g/dL (ref 30.0–36.0)
MCV: 91.4 fL (ref 78.0–100.0)
Monocytes Absolute: 0.4 10*3/uL (ref 0.1–1.0)
Monocytes Relative: 8 % (ref 3–12)
Neutro Abs: 2.8 10*3/uL (ref 1.7–7.7)
Neutrophils Relative %: 55 % (ref 43–77)
Platelets: 148 10*3/uL — ABNORMAL LOW (ref 150–400)
RBC: 3.97 MIL/uL — AB (ref 4.22–5.81)
RDW: 18.8 % — AB (ref 11.5–15.5)
WBC: 5.1 10*3/uL (ref 4.0–10.5)

## 2014-10-10 LAB — COMPREHENSIVE METABOLIC PANEL
ALK PHOS: 62 U/L (ref 39–117)
ALT: 47 U/L (ref 0–53)
AST: 49 U/L — AB (ref 0–37)
Albumin: 3.7 g/dL (ref 3.5–5.2)
Anion gap: 7 (ref 5–15)
BILIRUBIN TOTAL: 0.3 mg/dL (ref 0.3–1.2)
BUN: 19 mg/dL (ref 6–23)
CHLORIDE: 107 mmol/L (ref 96–112)
CO2: 25 mmol/L (ref 19–32)
CREATININE: 0.99 mg/dL (ref 0.50–1.35)
Calcium: 8.8 mg/dL (ref 8.4–10.5)
GFR calc Af Amer: >90 mL/min (ref >90)
GFR, EST NON AFRICAN AMERICAN: 87 mL/min — AB (ref >90)
Glucose, Bld: 99 mg/dL (ref 70–99)
POTASSIUM: 4.3 mmol/L (ref 3.5–5.1)
Sodium: 139 mmol/L (ref 135–145)
Total Protein: 6.3 g/dL (ref 6.0–8.3)

## 2014-10-10 MED ORDER — HEPARIN SOD (PORK) LOCK FLUSH 100 UNIT/ML IV SOLN
500.0000 [IU] | Freq: Once | INTRAVENOUS | Status: AC | PRN
  Administered 2014-10-10: 500 [IU]

## 2014-10-10 MED ORDER — SODIUM CHLORIDE 0.9 % IV SOLN: 8.0000 mg | Freq: Once | INTRAVENOUS | Status: DC

## 2014-10-10 MED ORDER — SODIUM CHLORIDE 0.9 % IV SOLN
Freq: Once | INTRAVENOUS | Status: AC
  Administered 2014-10-10: 12:00:00 via INTRAVENOUS

## 2014-10-10 MED ORDER — SODIUM CHLORIDE 0.9 % IV SOLN
535.0000 mg | Freq: Once | INTRAVENOUS | Status: AC
  Administered 2014-10-10: 540 mg via INTRAVENOUS
  Filled 2014-10-10: qty 54

## 2014-10-10 MED ORDER — SODIUM CHLORIDE 0.9 % IV SOLN
Freq: Once | INTRAVENOUS | Status: AC
  Administered 2014-10-10: 8 mg via INTRAVENOUS
  Filled 2014-10-10: qty 4

## 2014-10-10 MED ORDER — SODIUM CHLORIDE 0.9 % IJ SOLN: 10.0000 mL | INTRAMUSCULAR | Status: DC | PRN

## 2014-10-10 MED ORDER — SODIUM CHLORIDE 0.9 % IV SOLN
750.0000 mg/m2 | Freq: Once | INTRAVENOUS | Status: AC
  Administered 2014-10-10: 1444 mg via INTRAVENOUS
  Filled 2014-10-10: qty 37.98

## 2014-10-10 MED ORDER — DEXAMETHASONE SODIUM PHOSPHATE 10 MG/ML IJ SOLN: 20.0000 mg | Freq: Once | INTRAMUSCULAR | Status: DC

## 2014-10-10 NOTE — Patient Instructions (Signed)
[place] at [facility] Discharge Instructions  RECOMMENDATIONS MADE BY THE CONSULTANT AND ANY TEST RESULTS WILL BE SENT TO YOUR REFERRING PHYSICIAN.  Exam per Meriel Flavors. We will continue with current therapy plan, chemotherapy every 2 weeks as long as blood counts are adequate. Return to clinic as scheduled in 2 weeks for chemotherapy and MD appointment. Report any issues/concerns to clinic as needed prior to appointments.  Thank you for choosing [place] at [facility] to provide your oncology and hematology care.  To afford each patient quality time with our provider, please arrive at least 15 minutes before your scheduled appointment time.    You need to re-schedule your appointment should you arrive 10 or more minutes late.  We strive to give you quality time with our providers, and arriving late affects you and other patients whose appointments are after yours.  Also, if you no show three or more times for appointments you may be dismissed from the clinic at the providers discretion.     Again, thank you for choosing [facility].  Our hope is that these requests will decrease the amount of time that you wait before being seen by our physicians.       _____________________________________________________________  Should you have questions after your visit to [facility], please contact our office at [phone] between the hours of 8:30 a.m. and 4:30 p.m.  Voicemails left after 4:30 p.m. will not be returned until the following business day.  For prescription refill requests, have your pharmacy contact our office.

## 2014-10-10 NOTE — Progress Notes (Signed)
Tolerated well

## 2014-10-10 NOTE — Assessment & Plan Note (Addendum)
64 year old male with stage IV bladder cancer currently receiving therapy with carboplatin and Gemzar with a Gemzar dose reduction to 750 mg/m because of a platelet count on day 8 cycle 1.  He has excellent tolerance of treatment thus far.  Pre-chemo labs today as ordered to verify that he meets treatment parameters.   I will change his treatment plan to reflect a day 1, 15 treatment.  Oncology history updated  Return as planned in 2 weeks, at which time restaging scans can be ordered and scheduled if needed.

## 2014-10-10 NOTE — Progress Notes (Signed)
Jesus Cahill, MD  [name] [address]  Metastatic urothelial carcinoma  CURRENT THERAPY: Carboplatin/Gemzar  INTERVAL HISTORY: Jesus [name] 64 y.o. male returns for followup of stage IV urothelial carcinoma.     Metastatic urothelial carcinoma   07/09/2014 Imaging  CT A/P Irregular and lobular bladder wall thickening suspicious for transitional cell carcinoma. Considerable associated pelvic and retroperitoneal adenopathy. 1.4 by 1.3 cm complex and likely enhancing lesion of the right hepatic lobe dome   07/16/2014 - 07/19/2014 Hospital Admission Hospital admission for TURP. Course complicated by ARF   02/05/5630 Procedure TRANSURETHRAL RESECTION OF BLADDER TUMOR WITH GYRUS (TURBT-GYRUS) CYSTOSCOPY WITH PROSTATE ULTRASOUND AND BIOPSY.  Dr. Irine Seal   08/08/2014 PET scan Irregular bladder wall thickening, compatible with known primary bladder neoplasm. Associated mild left hydroureteronephrosis. Extensive retroperitoneal nodal metastases. Associated pelvic implant, suspicious for peritoneal disease.2.3 metastasis liver   08/23/2014 - 10/10/2014 Chemotherapy Carboplatin and Gemzar day 1 and 8 given in the palliative setting   10/10/2014 Treatment Plan Change Changing chemotherapy to every other week and cycle length to 28 day cycle   10/10/2014 -  Chemotherapy Carboplatin/Gemzar day 1 and 15 every 28 days in the palliative setting.    I personally reviewed and went over laboratory results with the patient.  The results are noted within this dictation.  He had a few questions regarding his urination.  He reports that he urinates approximately 12 times per day.  He reports that it is decreasing in frequency.  He denies any burning or pain.  He denies any hematuria.  He reports a complete bladder emptying sensation.  His flow is improving he notes.  I do not see any reason for further work-up at this time.   Oncologically, he denies any complaints and ROS questioning is  negative.     Past Medical History  Diagnosis Date  . High cholesterol   . Depression     hx of   . Arthritis   . Bladder cancer   . Metastatic urothelial carcinoma 07/16/2014    has Metastatic urothelial carcinoma; Acute renal insufficiency; and Iron deficiency anemia due to chronic blood loss on his problem list.     has No Known Allergies.  [name] does not currently have medications on file.  Past Surgical History  Procedure Laterality Date  . Knee surgery    . Tonsillectomy    . Transurethral resection of bladder tumor with gyrus (turbt-gyrus) N/A 07/16/2014    Procedure: TRANSURETHRAL RESECTION OF BLADDER TUMOR WITH GYRUS (TURBT-GYRUS);  Surgeon: Malka So, MD;  Location: [facility];  Service: Urology;  Laterality: N/A;  . Cystoscopy with biopsy N/A 07/16/2014    Procedure: CYSTOSCOPY WITH PROSTATE ULTRASOUND AND BIOPSY;  Surgeon: Malka So, MD;  Location: [facility];  Service: Urology;  Laterality: N/A;    Denies any headaches, dizziness, double vision, fevers, chills, night sweats, nausea, vomiting, diarrhea, constipation, chest pain, heart palpitations, shortness of breath, blood in stool, black tarry stool, urinary pain, urinary burning, hematuria.   PHYSICAL EXAMINATION  ECOG PERFORMANCE STATUS: 0 - Asymptomatic  Filed Vitals:   10/10/14 1000  BP: 140/87  Pulse: 73  Temp: 98.3 F (36.8 C)  Resp: 16    GENERAL:alert, no distress, well nourished, well developed, comfortable, cooperative and smiling SKIN: skin color, texture, turgor are normal, no rashes or significant lesions HEAD: Normocephalic, No masses, lesions, tenderness or abnormalities EYES: normal, PERRLA, EOMI, Conjunctiva are pink and  non-injected EARS: External ears normal OROPHARYNX:lips, buccal mucosa, and tongue normal and mucous membranes are moist  NECK: supple, no adenopathy, thyroid normal size, non-tender, without nodularity, no stridor, non-tender, trachea midline LYMPH:  no palpable  lymphadenopathy, no hepatosplenomegaly BREAST:not examined LUNGS: clear to auscultation and percussion HEART: regular rate & rhythm, no murmurs, no gallops, S1 normal and S2 normal ABDOMEN:abdomen soft, non-tender, normal bowel sounds and no masses or organomegaly BACK: Back symmetric, no curvature., No CVA tenderness EXTREMITIES:less then 2 second capillary refill, no joint deformities, effusion, or inflammation, no skin discoloration, no cyanosis  NEURO: alert & oriented x 3 with fluent speech, no focal motor/sensory deficits, gait normal   LABORATORY DATA: CBC    Component Value Date/Time   WBC 4.8 09/26/2014 0950   RBC 3.98* 09/26/2014 0950   HGB 11.3* 09/26/2014 0950   HCT 35.4* 09/26/2014 0950   PLT 112* 09/26/2014 0950   MCV 88.9 09/26/2014 0950   MCH 28.4 09/26/2014 0950   MCHC 31.9 09/26/2014 0950   RDW 15.9* 09/26/2014 0950   LYMPHSABS 1.8 09/26/2014 0950   MONOABS 0.5 09/26/2014 0950   EOSABS 0.0 09/26/2014 0950   BASOSABS 0.0 09/26/2014 0950      Chemistry      Component Value Date/Time   NA 138 09/26/2014 0950   K 4.1 09/26/2014 0950   CL 107 09/26/2014 0950   CO2 25 09/26/2014 0950   BUN 22 09/26/2014 0950   CREATININE 1.14 09/26/2014 0950      Component Value Date/Time   CALCIUM 8.9 09/26/2014 0950   ALKPHOS 65 09/26/2014 0950   AST 36 09/26/2014 0950   ALT 43 09/26/2014 0950   BILITOT 0.5 09/26/2014 0950        ASSESSMENT AND PLAN:  Metastatic urothelial carcinoma 64 year old male with stage IV bladder cancer currently receiving therapy with carboplatin and Gemzar with a Gemzar dose reduction to 750 mg/m because of a platelet count on day 8 cycle 1.  He has excellent tolerance of treatment thus far.  Pre-chemo labs today as ordered to verify that he meets treatment parameters.   I will change his treatment plan to reflect a day 1, 15 treatment.  Oncology history updated  Return as planned in 2 weeks, at which time restaging scans can be  ordered and scheduled if needed.     THERAPY PLAN:  Continue palliative treatment as planned.   All questions were answered. The patient knows to call the clinic with any problems, questions or concerns. We can certainly see the patient much sooner if necessary.  Patient and plan discussed with Dr. Ancil Linsey and she is in agreement with the aforementioned.   This note is electronically signed by: Robynn Pane 10/10/2014 10:29 AM

## 2014-10-10 NOTE — Addendum Note (Signed)
Addended by: Kurtis Bushman A on: 10/10/2014 03:31 PM   Modules accepted: Orders

## 2014-10-17 ENCOUNTER — Inpatient Hospital Stay (HOSPITAL_COMMUNITY): Payer: Commercial Managed Care - HMO

## 2014-10-23 ENCOUNTER — Inpatient Hospital Stay (HOSPITAL_COMMUNITY): Payer: Commercial Managed Care - HMO

## 2014-10-24 ENCOUNTER — Encounter (HOSPITAL_COMMUNITY): Payer: Self-pay | Admitting: Hematology & Oncology

## 2014-10-24 ENCOUNTER — Other Ambulatory Visit (HOSPITAL_COMMUNITY): Payer: Self-pay | Admitting: *Deleted

## 2014-10-24 ENCOUNTER — Encounter (HOSPITAL_BASED_OUTPATIENT_CLINIC_OR_DEPARTMENT_OTHER): Payer: Commercial Managed Care - HMO | Admitting: Hematology & Oncology

## 2014-10-24 ENCOUNTER — Encounter (HOSPITAL_COMMUNITY): Payer: Commercial Managed Care - HMO | Attending: Hematology & Oncology

## 2014-10-24 ENCOUNTER — Telehealth (HOSPITAL_COMMUNITY): Payer: Self-pay | Admitting: *Deleted

## 2014-10-24 VITALS — BP 107/57 | HR 49 | Temp 97.7°F | Resp 16

## 2014-10-24 VITALS — BP 123/77 | HR 60 | Temp 98.3°F | Resp 16 | Wt 171.8 lb

## 2014-10-24 DIAGNOSIS — C7910 Secondary malignant neoplasm of unspecified urinary organs: Secondary | ICD-10-CM

## 2014-10-24 DIAGNOSIS — D500 Iron deficiency anemia secondary to blood loss (chronic): Secondary | ICD-10-CM

## 2014-10-24 DIAGNOSIS — C679 Malignant neoplasm of bladder, unspecified: Secondary | ICD-10-CM

## 2014-10-24 DIAGNOSIS — C772 Secondary and unspecified malignant neoplasm of intra-abdominal lymph nodes: Secondary | ICD-10-CM | POA: Diagnosis not present

## 2014-10-24 DIAGNOSIS — Z5111 Encounter for antineoplastic chemotherapy: Secondary | ICD-10-CM | POA: Diagnosis not present

## 2014-10-24 DIAGNOSIS — C787 Secondary malignant neoplasm of liver and intrahepatic bile duct: Secondary | ICD-10-CM | POA: Diagnosis not present

## 2014-10-24 LAB — COMPREHENSIVE METABOLIC PANEL
ALT: 53 U/L (ref 17–63)
ANION GAP: 8 (ref 5–15)
AST: 45 U/L — ABNORMAL HIGH (ref 15–41)
Albumin: 4 g/dL (ref 3.5–5.0)
Alkaline Phosphatase: 69 U/L (ref 38–126)
BILIRUBIN TOTAL: 0.6 mg/dL (ref 0.3–1.2)
BUN: 23 mg/dL — AB (ref 6–20)
CHLORIDE: 106 mmol/L (ref 101–111)
CO2: 26 mmol/L (ref 22–32)
CREATININE: 1.22 mg/dL (ref 0.61–1.24)
Calcium: 8.9 mg/dL (ref 8.9–10.3)
GFR calc Af Amer: >60 mL/min (ref >60)
Glucose, Bld: 114 mg/dL — ABNORMAL HIGH (ref 70–99)
POTASSIUM: 3.7 mmol/L (ref 3.5–5.1)
Sodium: 140 mmol/L (ref 135–145)
Total Protein: 6.3 g/dL — ABNORMAL LOW (ref 6.5–8.1)

## 2014-10-24 LAB — CBC WITH DIFFERENTIAL/PLATELET
BASOS ABS: 0 10*3/uL (ref 0.0–0.1)
Basophils Relative: 0 % (ref 0–1)
Eosinophils Absolute: 0.1 10*3/uL (ref 0.0–0.7)
Eosinophils Relative: 2 % (ref 0–5)
HEMATOCRIT: 35.4 % — AB (ref 39.0–52.0)
Hemoglobin: 11.7 g/dL — ABNORMAL LOW (ref 13.0–17.0)
LYMPHS PCT: 37 % (ref 12–46)
Lymphs Abs: 1.3 10*3/uL (ref 0.7–4.0)
MCH: 30.6 pg (ref 26.0–34.0)
MCHC: 33.1 g/dL (ref 30.0–36.0)
MCV: 92.7 fL (ref 78.0–100.0)
MONO ABS: 0.4 10*3/uL (ref 0.1–1.0)
Monocytes Relative: 12 % (ref 3–12)
Neutro Abs: 1.7 10*3/uL (ref 1.7–7.7)
Neutrophils Relative %: 50 % (ref 43–77)
Platelets: 101 10*3/uL — ABNORMAL LOW (ref 150–400)
RBC: 3.82 MIL/uL — ABNORMAL LOW (ref 4.22–5.81)
RDW: 19.6 % — ABNORMAL HIGH (ref 11.5–15.5)
SMEAR REVIEW: DECREASED
WBC: 3.5 10*3/uL — ABNORMAL LOW (ref 4.0–10.5)

## 2014-10-24 MED ORDER — ONDANSETRON HCL 40 MG/20ML IJ SOLN
Freq: Once | INTRAMUSCULAR | Status: AC
  Administered 2014-10-24: 8 mg via INTRAVENOUS
  Filled 2014-10-24: qty 4

## 2014-10-24 MED ORDER — HEPARIN SOD (PORK) LOCK FLUSH 100 UNIT/ML IV SOLN
500.0000 [IU] | Freq: Once | INTRAVENOUS | Status: AC | PRN
  Administered 2014-10-24: 500 [IU]
  Filled 2014-10-24: qty 5

## 2014-10-24 MED ORDER — SODIUM CHLORIDE 0.9 % IV SOLN
Freq: Once | INTRAVENOUS | Status: AC
  Administered 2014-10-24: 11:00:00 via INTRAVENOUS

## 2014-10-24 MED ORDER — SODIUM CHLORIDE 0.9 % IV SOLN: 8.0000 mg | Freq: Once | INTRAVENOUS | Status: DC

## 2014-10-24 MED ORDER — GEMCITABINE HCL CHEMO INJECTION 1 GM/26.3ML
750.0000 mg/m2 | Freq: Once | INTRAVENOUS | Status: AC
  Administered 2014-10-24: 1444 mg via INTRAVENOUS
  Filled 2014-10-24: qty 37.98

## 2014-10-24 MED ORDER — SODIUM CHLORIDE 0.9 % IJ SOLN
10.0000 mL | INTRAMUSCULAR | Status: DC | PRN
  Administered 2014-10-24: 10 mL
  Filled 2014-10-24: qty 10

## 2014-10-24 MED ORDER — DEXAMETHASONE SODIUM PHOSPHATE 10 MG/ML IJ SOLN: 20.0000 mg | Freq: Once | INTRAMUSCULAR | Status: DC

## 2014-10-24 NOTE — Patient Instructions (Addendum)
[place] at [facility] Discharge Instructions  RECOMMENDATIONS MADE BY THE CONSULTANT AND ANY TEST RESULTS WILL BE SENT TO YOUR REFERRING PHYSICIAN.  Exam and discussion by Dr. Whitney Muse. Will continue current therapy. Report fevers, uncontrolled nausea, vomiting or other problems. Will get you scheduled for PET Scan to see how things are.  Follow-up: PET Scan - 11/04/14 at [facility] - arrive at 8:45 am - nothing to eat or drink 6 hours prior to the scan.  Don't eat or drink anything containing any sugar.  Chemotherapy as scheduled Office visit in 2 weeks.  Thank you for choosing [place] at [facility] to provide your oncology and hematology care.  To afford each patient quality time with our provider, please arrive at least 15 minutes before your scheduled appointment time.    You need to re-schedule your appointment should you arrive 10 or more minutes late.  We strive to give you quality time with our providers, and arriving late affects you and other patients whose appointments are after yours.  Also, if you no show three or more times for appointments you may be dismissed from the clinic at the providers discretion.     Again, thank you for choosing [facility].  Our hope is that these requests will decrease the amount of time that you wait before being seen by our physicians.       _____________________________________________________________  Should you have questions after your visit to [facility], please contact our office at [phone] between the hours of 8:30 a.m. and 4:30 p.m.  Voicemails left after 4:30 p.m. will not be returned until the following business day.  For prescription refill requests, have your pharmacy contact our office.

## 2014-10-24 NOTE — Progress Notes (Addendum)
Jesus Cahill, MD  [place] [address]   Metastatic urothelial carcinoma   Staging form: Urinary Bladder, AJCC 7th Edition     Clinical stage from 09/19/2014: Stage IV (T4a, N3, M1) - Signed by Baird Cancer, PA-C on 09/19/2014   CURRENT THERAPY: Carboplatin/Gemzar  INTERVAL HISTORY: Jesus [name] 64 y.o. male returns for followup of stage IV urothelial carcinoma. Pt has visibly gained weight. Pt reports "feeling good". Son does not report any concerns regarding his father's condition. Pt is still going to senior center, going to church, and now goes to [employer]. Pt denies problems sleeping, nausea, vomiting, hematuria. Pt denies any new pain. Pt reports going to the restroom has changed, the urgency to use the bathroom has gone and now he goes when he feels he needs to.   He has no other complaints or concerns. He is here for his next cycle of chemotherapy.    Metastatic urothelial carcinoma   07/09/2014 Imaging  CT A/P Irregular and lobular bladder wall thickening suspicious for transitional cell carcinoma. Considerable associated pelvic and retroperitoneal adenopathy. 1.4 by 1.3 cm complex and likely enhancing lesion of the right hepatic lobe dome   07/16/2014 - 07/19/2014 Hospital Admission Hospital admission for TURP. Course complicated by ARF   09/09/2246 Procedure TRANSURETHRAL RESECTION OF BLADDER TUMOR WITH GYRUS (TURBT-GYRUS) CYSTOSCOPY WITH PROSTATE ULTRASOUND AND BIOPSY.  Dr. Irine Seal   08/08/2014 PET scan Irregular bladder wall thickening, compatible with known primary bladder neoplasm. Associated mild left hydroureteronephrosis. Extensive retroperitoneal nodal metastases. Associated pelvic implant, suspicious for peritoneal disease.2.3 metastasis liver   08/23/2014 - 10/10/2014 Chemotherapy Carboplatin and Gemzar day 1 and 8 given in the palliative setting   10/10/2014 Treatment Plan Change Changing chemotherapy to every other week and cycle length to 28  day cycle   10/10/2014 -  Chemotherapy Carboplatin/Gemzar day 1 and 15 every 28 days in the palliative setting.        Past Medical History  Diagnosis Date  . High cholesterol   . Depression     hx of   . Arthritis   . Bladder cancer   . Metastatic urothelial carcinoma 07/16/2014    has Metastatic urothelial carcinoma; Acute renal insufficiency; and Iron deficiency anemia due to chronic blood loss on his problem list.     has No Known Allergies.  [name] does not currently have medications on file.  Past Surgical History  Procedure Laterality Date  . Knee surgery    . Tonsillectomy    . Transurethral resection of bladder tumor with gyrus (turbt-gyrus) N/A 07/16/2014    Procedure: TRANSURETHRAL RESECTION OF BLADDER TUMOR WITH GYRUS (TURBT-GYRUS);  Surgeon: Malka So, MD;  Location: [facility];  Service: Urology;  Laterality: N/A;  . Cystoscopy with biopsy N/A 07/16/2014    Procedure: CYSTOSCOPY WITH PROSTATE ULTRASOUND AND BIOPSY;  Surgeon: Malka So, MD;  Location: [facility];  Service: Urology;  Laterality: N/A;   14 point review of systems is performed and is negative except as detailed in history of present illness   PHYSICAL EXAMINATION  ECOG PERFORMANCE STATUS: 0 - Asymptomatic  Filed Vitals:   10/24/14 0933  BP: 123/77  Pulse: 60  Temp: 98.3 F (36.8 C)  Resp: 16    GENERAL:alert, no distress, well nourished, well developed, comfortable, cooperative and smiling SKIN: skin color, texture, turgor are normal, no rashes or significant lesions HEAD: Normocephalic, No masses, lesions, tenderness or abnormalities  EYES: normal, PERRLA, EOMI, Conjunctiva are pink and non-injected EARS: External ears normal OROPHARYNX:lips, buccal mucosa, and tongue normal and mucous membranes are moist  NECK: supple, no adenopathy, thyroid normal size, non-tender, without nodularity, no stridor, non-tender, trachea midline LYMPH:  no palpable lymphadenopathy, no  hepatosplenomegaly BREAST:not examined LUNGS: clear to auscultation and percussion HEART: regular rate & rhythm, no murmurs, no gallops, S1 normal and S2 normal ABDOMEN:abdomen soft, non-tender, normal bowel sounds and no masses or organomegaly BACK: Back symmetric, no curvature., No CVA tenderness EXTREMITIES:less then 2 second capillary refill, no joint deformities, effusion, or inflammation, no skin discoloration, no cyanosis  NEURO: alert & oriented x 3 with fluent speech, no focal motor/sensory deficits, gait normal   LABORATORY DATA: CBC    Component Value Date/Time   WBC 3.5* 10/24/2014 0950   RBC 3.82* 10/24/2014 0950   HGB 11.7* 10/24/2014 0950   HCT 35.4* 10/24/2014 0950   PLT 101* 10/24/2014 0950   MCV 92.7 10/24/2014 0950   MCH 30.6 10/24/2014 0950   MCHC 33.1 10/24/2014 0950   RDW 19.6* 10/24/2014 0950   LYMPHSABS 1.3 10/24/2014 0950   MONOABS 0.4 10/24/2014 0950   EOSABS 0.1 10/24/2014 0950   BASOSABS 0.0 10/24/2014 0950      Chemistry      Component Value Date/Time   NA 140 10/24/2014 0950   K 3.7 10/24/2014 0950   CL 106 10/24/2014 0950   CO2 26 10/24/2014 0950   BUN 23* 10/24/2014 0950   CREATININE 1.22 10/24/2014 0950      Component Value Date/Time   CALCIUM 8.9 10/24/2014 0950   ALKPHOS 69 10/24/2014 0950   AST 45* 10/24/2014 0950   ALT 53 10/24/2014 0950   BILITOT 0.6 10/24/2014 0950        ASSESSMENT AND PLAN:  Stage IV urothelial carcinoma of the bladder  Patient has done remarkably well with chemotherapy. He has gained weight through treatment and increased his activity level. He will need restaging and we will set him up for repeat pet imaging prior to his next cycle of chemotherapy. His son came with him today and had multiple questions we spent time addressing his concerns. He is overall pleased with how well his dad has done. We discussed second line therapy and again reviewed general standard of care in regards to management of stage IV  bladder cancer.  We will send Foundation one testing on his tumor to explore the possibility of other therapies. We discussed trying to complete 6 cycles of chemotherapy since the patient is tolerating treatment well and having an excellent response.  THERAPY PLAN:  Continue palliative treatment as planned.  Orders Placed This Encounter  Procedures  . NM PET Image Initial (PI) Skull Base To Thigh    Standing Status: Future     Number of Occurrences:      Standing Expiration Date: 10/24/2015    Order Specific Question:  Reason for Exam (SYMPTOM  OR DIAGNOSIS REQUIRED)    Answer:  Restaging stage IV bladder cancer    Order Specific Question:  Preferred imaging location?    Answer:  [facility]  . CBC with Differential    Standing Status: Standing     Number of Occurrences: 10     Standing Expiration Date: 10/24/2015  . Comprehensive metabolic panel    Standing Status: Standing     Number of Occurrences: 10     Standing Expiration Date: 10/24/2015    All questions were answered. The patient knows  to call the clinic with any problems, questions or concerns. We can certainly see the patient much sooner if necessary.   This document serves as a record of services personally performed by Ancil Linsey, MD. It was created on her behalf by Arlyce Harman, a trained medical scribe. The creation of this record is based on the scribe's personal observations and the provider's statements to them. This document has been checked and approved by the attending provider. This note was electronically signed. I have reviewed the above documentation for accuracy and completeness and I agree with the above.   Ancil Linsey, MD

## 2014-10-24 NOTE — Patient Instructions (Signed)
[facility] Discharge Instructions for Patients Receiving Chemotherapy  Today you received the following chemotherapy agents Gemzar.  To help prevent nausea and vomiting after your treatment, we encourage you to take your nausea medication as instructed.   If you develop nausea and vomiting that is not controlled by your nausea medication, call the clinic. If it is after clinic hours your family physician or the after hours number for the clinic or go to the Emergency Department.   BELOW ARE SYMPTOMS THAT SHOULD BE REPORTED IMMEDIATELY:  *FEVER GREATER THAN 101.0 F  *CHILLS WITH OR WITHOUT FEVER  NAUSEA AND VOMITING THAT IS NOT CONTROLLED WITH YOUR NAUSEA MEDICATION  *UNUSUAL SHORTNESS OF BREATH  *UNUSUAL BRUISING OR BLEEDING  TENDERNESS IN MOUTH AND THROAT WITH OR WITHOUT PRESENCE OF ULCERS  *URINARY PROBLEMS  *BOWEL PROBLEMS  UNUSUAL RASH Items with * indicate a potential emergency and should be followed up as soon as possible.  Return as scheduled.  I have been informed and understand all the instructions given to me. I know to contact the clinic, my physician, or go to the Emergency Department if any problems should occur. I do not have any questions at this time, but understand that I may call the clinic during office hours or the Patient Navigator at [phone] should I have any questions or need assistance in obtaining follow up care.    __________________________________________  _____________  __________ Signature of Patient or Authorized Representative            Date                   Time    __________________________________________ Nurse's Signature  

## 2014-10-24 NOTE — Telephone Encounter (Signed)
[facility] One testing request sent to Pathology. I called and let Alyse Low know to be on the look out for it.

## 2014-10-24 NOTE — Progress Notes (Signed)
Tolerated chemo well. 

## 2014-10-25 ENCOUNTER — Other Ambulatory Visit (HOSPITAL_COMMUNITY)
Admission: RE | Admit: 2014-10-25 | Discharge: 2014-10-25 | Disposition: A | Discharge status: Home / Self Care | Payer: Commercial Managed Care - HMO | Source: Ambulatory Visit | Attending: Hematology & Oncology | Admitting: Hematology & Oncology

## 2014-10-25 DIAGNOSIS — D500 Iron deficiency anemia secondary to blood loss (chronic): Secondary | ICD-10-CM | POA: Diagnosis not present

## 2014-10-25 DIAGNOSIS — C7910 Secondary malignant neoplasm of unspecified urinary organs: Secondary | ICD-10-CM | POA: Diagnosis present

## 2014-10-30 ENCOUNTER — Inpatient Hospital Stay (HOSPITAL_COMMUNITY): Payer: Commercial Managed Care - HMO

## 2014-10-31 ENCOUNTER — Inpatient Hospital Stay (HOSPITAL_COMMUNITY): Payer: Commercial Managed Care - HMO

## 2014-11-04 ENCOUNTER — Other Ambulatory Visit (HOSPITAL_COMMUNITY): Payer: Self-pay | Admitting: Hematology & Oncology

## 2014-11-04 ENCOUNTER — Ambulatory Visit (HOSPITAL_COMMUNITY)
Admission: RE | Admit: 2014-11-04 | Discharge: 2014-11-04 | Disposition: A | Discharge status: Home / Self Care | Payer: Commercial Managed Care - HMO | Source: Ambulatory Visit | Attending: Hematology & Oncology | Admitting: Hematology & Oncology

## 2014-11-04 DIAGNOSIS — C7910 Secondary malignant neoplasm of unspecified urinary organs: Secondary | ICD-10-CM

## 2014-11-04 DIAGNOSIS — C679 Malignant neoplasm of bladder, unspecified: Secondary | ICD-10-CM | POA: Insufficient documentation

## 2014-11-04 LAB — GLUCOSE, CAPILLARY: GLUCOSE-CAPILLARY: 96 mg/dL (ref 65–99)

## 2014-11-04 MED ORDER — FLUDEOXYGLUCOSE F - 18 (FDG) INJECTION
8.5200 | Freq: Once | INTRAVENOUS | Status: AC | PRN
  Administered 2014-11-04: 8.52 via INTRAVENOUS

## 2014-11-07 ENCOUNTER — Encounter (HOSPITAL_BASED_OUTPATIENT_CLINIC_OR_DEPARTMENT_OTHER): Payer: Commercial Managed Care - HMO

## 2014-11-07 ENCOUNTER — Encounter (HOSPITAL_BASED_OUTPATIENT_CLINIC_OR_DEPARTMENT_OTHER): Payer: Commercial Managed Care - HMO | Admitting: Hematology & Oncology

## 2014-11-07 ENCOUNTER — Encounter (HOSPITAL_COMMUNITY): Payer: Self-pay | Admitting: Hematology & Oncology

## 2014-11-07 VITALS — BP 103/61 | HR 55 | Temp 97.7°F | Resp 18

## 2014-11-07 DIAGNOSIS — C772 Secondary and unspecified malignant neoplasm of intra-abdominal lymph nodes: Secondary | ICD-10-CM

## 2014-11-07 DIAGNOSIS — D500 Iron deficiency anemia secondary to blood loss (chronic): Secondary | ICD-10-CM

## 2014-11-07 DIAGNOSIS — C679 Malignant neoplasm of bladder, unspecified: Secondary | ICD-10-CM

## 2014-11-07 DIAGNOSIS — Z5111 Encounter for antineoplastic chemotherapy: Secondary | ICD-10-CM | POA: Diagnosis not present

## 2014-11-07 DIAGNOSIS — C7910 Secondary malignant neoplasm of unspecified urinary organs: Secondary | ICD-10-CM

## 2014-11-07 DIAGNOSIS — C787 Secondary malignant neoplasm of liver and intrahepatic bile duct: Secondary | ICD-10-CM | POA: Diagnosis not present

## 2014-11-07 LAB — COMPREHENSIVE METABOLIC PANEL
ALBUMIN: 3.9 g/dL (ref 3.5–5.0)
ALT: 27 U/L (ref 17–63)
AST: 26 U/L (ref 15–41)
Alkaline Phosphatase: 57 U/L (ref 38–126)
Anion gap: 6 (ref 5–15)
BUN: 17 mg/dL (ref 6–20)
CHLORIDE: 108 mmol/L (ref 101–111)
CO2: 27 mmol/L (ref 22–32)
CREATININE: 1.08 mg/dL (ref 0.61–1.24)
Calcium: 8.6 mg/dL — ABNORMAL LOW (ref 8.9–10.3)
GFR calc non Af Amer: >60 mL/min (ref >60)
Glucose, Bld: 97 mg/dL (ref 65–99)
Potassium: 4 mmol/L (ref 3.5–5.1)
Sodium: 141 mmol/L (ref 135–145)
TOTAL PROTEIN: 6.3 g/dL — AB (ref 6.5–8.1)
Total Bilirubin: 0.5 mg/dL (ref 0.3–1.2)

## 2014-11-07 LAB — CBC WITH DIFFERENTIAL/PLATELET
BASOS PCT: 0 % (ref 0–1)
Basophils Absolute: 0 10*3/uL (ref 0.0–0.1)
Eosinophils Absolute: 0.1 10*3/uL (ref 0.0–0.7)
Eosinophils Relative: 1 % (ref 0–5)
HCT: 36.7 % — ABNORMAL LOW (ref 39.0–52.0)
Hemoglobin: 12 g/dL — ABNORMAL LOW (ref 13.0–17.0)
LYMPHS PCT: 33 % (ref 12–46)
Lymphs Abs: 1.4 10*3/uL (ref 0.7–4.0)
MCH: 30.7 pg (ref 26.0–34.0)
MCHC: 32.7 g/dL (ref 30.0–36.0)
MCV: 93.9 fL (ref 78.0–100.0)
MONO ABS: 0.4 10*3/uL (ref 0.1–1.0)
Monocytes Relative: 8 % (ref 3–12)
Neutro Abs: 2.4 10*3/uL (ref 1.7–7.7)
Neutrophils Relative %: 58 % (ref 43–77)
PLATELETS: 148 10*3/uL — AB (ref 150–400)
RBC: 3.91 MIL/uL — ABNORMAL LOW (ref 4.22–5.81)
RDW: 20.8 % — AB (ref 11.5–15.5)
WBC: 4.3 10*3/uL (ref 4.0–10.5)

## 2014-11-07 MED ORDER — SODIUM CHLORIDE 0.9 % IV SOLN
501.0000 mg | Freq: Once | INTRAVENOUS | Status: AC
  Administered 2014-11-07: 500 mg via INTRAVENOUS
  Filled 2014-11-07: qty 50

## 2014-11-07 MED ORDER — ONDANSETRON HCL 40 MG/20ML IJ SOLN
Freq: Once | INTRAMUSCULAR | Status: AC
  Administered 2014-11-07: 8 mg via INTRAVENOUS
  Filled 2014-11-07: qty 4

## 2014-11-07 MED ORDER — HEPARIN SOD (PORK) LOCK FLUSH 100 UNIT/ML IV SOLN
500.0000 [IU] | Freq: Once | INTRAVENOUS | Status: AC | PRN
  Administered 2014-11-07: 500 [IU]
  Filled 2014-11-07: qty 5

## 2014-11-07 MED ORDER — DEXAMETHASONE SODIUM PHOSPHATE 10 MG/ML IJ SOLN: 20.0000 mg | Freq: Once | INTRAMUSCULAR | Status: DC

## 2014-11-07 MED ORDER — SODIUM CHLORIDE 0.9 % IJ SOLN: 10.0000 mL | INTRAMUSCULAR | Status: DC | PRN

## 2014-11-07 MED ORDER — SODIUM CHLORIDE 0.9 % IV SOLN
Freq: Once | INTRAVENOUS | Status: AC
  Administered 2014-11-07: 11:00:00 via INTRAVENOUS

## 2014-11-07 MED ORDER — SODIUM CHLORIDE 0.9 % IV SOLN
750.0000 mg/m2 | Freq: Once | INTRAVENOUS | Status: AC
  Administered 2014-11-07: 1444 mg via INTRAVENOUS
  Filled 2014-11-07: qty 37.98

## 2014-11-07 MED ORDER — ONDANSETRON HCL 40 MG/20ML IJ SOLN: 8.0000 mg | Freq: Once | INTRAMUSCULAR | Status: DC

## 2014-11-07 NOTE — Progress Notes (Signed)
Tolerated well

## 2014-11-07 NOTE — Patient Instructions (Signed)
..  [place] at [facility] Discharge Instructions  RECOMMENDATIONS MADE BY THE CONSULTANT AND ANY TEST RESULTS WILL BE SENT TO YOUR REFERRING PHYSICIAN.  You saw Dr.Penland today and received chemo. Will see you in 2 weeks for your appointment with the PA and your scheduled chemo. Call for concerns or questions.  Thank you for choosing [place] at [facility] to provide your oncology and hematology care.  To afford each patient quality time with our provider, please arrive at least 15 minutes before your scheduled appointment time.    You need to re-schedule your appointment should you arrive 10 or more minutes late.  We strive to give you quality time with our providers, and arriving late affects you and other patients whose appointments are after yours.  Also, if you no show three or more times for appointments you may be dismissed from the clinic at the providers discretion.     Again, thank you for choosing [facility].  Our hope is that these requests will decrease the amount of time that you wait before being seen by our physicians.       _____________________________________________________________  Should you have questions after your visit to [facility], please contact our office at [phone] between the hours of 8:30 a.m. and 4:30 p.m.  Voicemails left after 4:30 p.m. will not be returned until the following business day.  For prescription refill requests, have your pharmacy contact our office.
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
## 2014-11-08 ENCOUNTER — Encounter (HOSPITAL_COMMUNITY): Payer: Self-pay

## 2014-11-08 ENCOUNTER — Telehealth (HOSPITAL_COMMUNITY): Payer: Self-pay

## 2014-11-08 NOTE — Telephone Encounter (Signed)
Would like to discuss his dad's recent PET scan and what the future plans are.  Can be reached at [phone].

## 2014-11-15 ENCOUNTER — Ambulatory Visit (INDEPENDENT_AMBULATORY_CARE_PROVIDER_SITE_OTHER): Payer: Commercial Managed Care - HMO | Admitting: Urology

## 2014-11-15 DIAGNOSIS — C787 Secondary malignant neoplasm of liver and intrahepatic bile duct: Secondary | ICD-10-CM | POA: Diagnosis not present

## 2014-11-15 DIAGNOSIS — C679 Malignant neoplasm of bladder, unspecified: Secondary | ICD-10-CM | POA: Diagnosis not present

## 2014-11-21 ENCOUNTER — Encounter (HOSPITAL_BASED_OUTPATIENT_CLINIC_OR_DEPARTMENT_OTHER): Payer: Commercial Managed Care - HMO | Admitting: Oncology

## 2014-11-21 ENCOUNTER — Encounter (HOSPITAL_COMMUNITY): Payer: Self-pay | Admitting: Oncology

## 2014-11-21 ENCOUNTER — Encounter (HOSPITAL_COMMUNITY): Payer: Commercial Managed Care - HMO | Attending: Hematology & Oncology

## 2014-11-21 VITALS — BP 122/63 | HR 63 | Temp 98.1°F | Resp 16 | Wt 167.2 lb

## 2014-11-21 VITALS — BP 114/60 | HR 52 | Temp 97.5°F | Resp 16

## 2014-11-21 DIAGNOSIS — C679 Malignant neoplasm of bladder, unspecified: Secondary | ICD-10-CM

## 2014-11-21 DIAGNOSIS — D500 Iron deficiency anemia secondary to blood loss (chronic): Secondary | ICD-10-CM

## 2014-11-21 DIAGNOSIS — C787 Secondary malignant neoplasm of liver and intrahepatic bile duct: Secondary | ICD-10-CM

## 2014-11-21 DIAGNOSIS — C7910 Secondary malignant neoplasm of unspecified urinary organs: Secondary | ICD-10-CM

## 2014-11-21 DIAGNOSIS — Z5111 Encounter for antineoplastic chemotherapy: Secondary | ICD-10-CM

## 2014-11-21 LAB — COMPREHENSIVE METABOLIC PANEL
ALT: 24 U/L (ref 17–63)
ANION GAP: 9 (ref 5–15)
AST: 30 U/L (ref 15–41)
Albumin: 4.1 g/dL (ref 3.5–5.0)
Alkaline Phosphatase: 45 U/L (ref 38–126)
BILIRUBIN TOTAL: 0.9 mg/dL (ref 0.3–1.2)
BUN: 18 mg/dL (ref 6–20)
CO2: 28 mmol/L (ref 22–32)
Calcium: 8.9 mg/dL (ref 8.9–10.3)
Chloride: 104 mmol/L (ref 101–111)
Creatinine, Ser: 1.21 mg/dL (ref 0.61–1.24)
GFR calc non Af Amer: >60 mL/min (ref >60)
Glucose, Bld: 97 mg/dL (ref 65–99)
POTASSIUM: 3.9 mmol/L (ref 3.5–5.1)
SODIUM: 141 mmol/L (ref 135–145)
Total Protein: 6.7 g/dL (ref 6.5–8.1)

## 2014-11-21 LAB — CBC WITH DIFFERENTIAL/PLATELET
Basophils Absolute: 0 10*3/uL (ref 0.0–0.1)
Basophils Relative: 0 % (ref 0–1)
EOS PCT: 1 % (ref 0–5)
Eosinophils Absolute: 0 10*3/uL (ref 0.0–0.7)
HCT: 35 % — ABNORMAL LOW (ref 39.0–52.0)
Hemoglobin: 11.7 g/dL — ABNORMAL LOW (ref 13.0–17.0)
Lymphocytes Relative: 45 % (ref 12–46)
Lymphs Abs: 1.6 10*3/uL (ref 0.7–4.0)
MCH: 31.5 pg (ref 26.0–34.0)
MCHC: 33.4 g/dL (ref 30.0–36.0)
MCV: 94.3 fL (ref 78.0–100.0)
MONO ABS: 0.5 10*3/uL (ref 0.1–1.0)
MONOS PCT: 13 % — AB (ref 3–12)
Neutro Abs: 1.5 10*3/uL — ABNORMAL LOW (ref 1.7–7.7)
Neutrophils Relative %: 41 % — ABNORMAL LOW (ref 43–77)
Platelets: 99 10*3/uL — ABNORMAL LOW (ref 150–400)
RBC: 3.71 MIL/uL — ABNORMAL LOW (ref 4.22–5.81)
RDW: 20 % — ABNORMAL HIGH (ref 11.5–15.5)
WBC: 3.5 10*3/uL — AB (ref 4.0–10.5)

## 2014-11-21 MED ORDER — SODIUM CHLORIDE 0.9 % IV SOLN: 8.0000 mg | Freq: Once | INTRAVENOUS | Status: DC

## 2014-11-21 MED ORDER — DEXAMETHASONE SODIUM PHOSPHATE 10 MG/ML IJ SOLN: 20.0000 mg | Freq: Once | INTRAMUSCULAR | Status: DC

## 2014-11-21 MED ORDER — SODIUM CHLORIDE 0.9 % IV SOLN
Freq: Once | INTRAVENOUS | Status: AC
  Administered 2014-11-21: 10:00:00 via INTRAVENOUS

## 2014-11-21 MED ORDER — SODIUM CHLORIDE 0.9 % IV SOLN
Freq: Once | INTRAVENOUS | Status: AC
  Administered 2014-11-21: 8 mg via INTRAVENOUS
  Filled 2014-11-21: qty 4

## 2014-11-21 MED ORDER — SODIUM CHLORIDE 0.9 % IJ SOLN: 10.0000 mL | INTRAMUSCULAR | Status: DC | PRN

## 2014-11-21 MED ORDER — SODIUM CHLORIDE 0.9 % IV SOLN
750.0000 mg/m2 | Freq: Once | INTRAVENOUS | Status: AC
  Administered 2014-11-21: 1444 mg via INTRAVENOUS
  Filled 2014-11-21: qty 37.98

## 2014-11-21 MED ORDER — HEPARIN SOD (PORK) LOCK FLUSH 100 UNIT/ML IV SOLN
500.0000 [IU] | Freq: Once | INTRAVENOUS | Status: AC | PRN
  Administered 2014-11-21: 500 [IU]
  Filled 2014-11-21: qty 5

## 2014-11-21 NOTE — Progress Notes (Signed)
Jesus Cahill, MD  [place] [address]  Metastatic urothelial carcinoma  CURRENT THERAPY: Carboplatin/Gemzar  INTERVAL HISTORY: Jesus [name] 64 y.o. male returns for followup of stage IV urothelial carcinoma.     Metastatic urothelial carcinoma   07/09/2014 Imaging  CT A/P Irregular and lobular bladder wall thickening suspicious for transitional cell carcinoma. Considerable associated pelvic and retroperitoneal adenopathy. 1.4 by 1.3 cm complex and likely enhancing lesion of the right hepatic lobe dome   07/16/2014 - 07/19/2014 Hospital Admission Hospital admission for TURP. Course complicated by ARF   12/18/5282 Procedure TRANSURETHRAL RESECTION OF BLADDER TUMOR WITH GYRUS (TURBT-GYRUS) CYSTOSCOPY WITH PROSTATE ULTRASOUND AND BIOPSY.  Dr. Irine Seal   08/08/2014 PET scan Irregular bladder wall thickening, compatible with known primary bladder neoplasm. Associated mild left hydroureteronephrosis. Extensive retroperitoneal nodal metastases. Associated pelvic implant, suspicious for peritoneal disease.2.3 metastasis liver   08/23/2014 - 10/10/2014 Chemotherapy Carboplatin and Gemzar day 1 and 8 given in the palliative setting   10/10/2014 Treatment Plan Change Changing chemotherapy to every other week and cycle length to 28 day cycle   10/10/2014 -  Chemotherapy Carboplatin/Gemzar day 1 and 15 every 28 days in the palliative setting.   11/04/2014 PET scan Interval complete metabolic response to therapy.  Residual, borderline left periaortic and right external iliac lymph nodes exhibit low level, nonspecific FDG uptake.    I personally reviewed and went over laboratory results with the patient.  The results are noted within this dictation.  Platelet count of 99,000 noted and ANC of 1.5 appreciated.  Will treat as planned.   He is excited about his PET scan results.   He is doing well and voices no complaints today.   Past Medical History  Diagnosis Date  . High  cholesterol   . Depression     hx of   . Arthritis   . Bladder cancer   . Metastatic urothelial carcinoma 07/16/2014    has Metastatic urothelial carcinoma; Acute renal insufficiency; and Iron deficiency anemia due to chronic blood loss on his problem list.     has No Known Allergies.  Jesus [name] does not currently have medications on file.  Past Surgical History  Procedure Laterality Date  . Knee surgery    . Tonsillectomy    . Transurethral resection of bladder tumor with gyrus (turbt-gyrus) N/A 07/16/2014    Procedure: TRANSURETHRAL RESECTION OF BLADDER TUMOR WITH GYRUS (TURBT-GYRUS);  Surgeon: Malka So, MD;  Location: [facility];  Service: Urology;  Laterality: N/A;  . Cystoscopy with biopsy N/A 07/16/2014    Procedure: CYSTOSCOPY WITH PROSTATE ULTRASOUND AND BIOPSY;  Surgeon: Malka So, MD;  Location: [facility];  Service: Urology;  Laterality: N/A;    Denies any headaches, dizziness, double vision, fevers, chills, night sweats, nausea, vomiting, diarrhea, constipation, chest pain, heart palpitations, shortness of breath, blood in stool, black tarry stool, urinary pain, urinary burning, hematuria.   PHYSICAL EXAMINATION  ECOG PERFORMANCE STATUS: 0 - Asymptomatic  Filed Vitals:   11/21/14 0900  BP: 122/63  Pulse: 63  Temp: 98.1 F (36.7 C)  Resp: 16    GENERAL:alert, no distress, well nourished, well developed, comfortable, cooperative and smiling SKIN: skin color, texture, turgor are normal, no rashes or significant lesions HEAD: Normocephalic, No masses, lesions, tenderness or abnormalities EYES: normal, PERRLA, EOMI, Conjunctiva are pink and non-injected EARS: External ears normal OROPHARYNX:lips, buccal mucosa, and tongue normal and mucous membranes are moist  NECK:  supple, no adenopathy, thyroid normal size, non-tender, without nodularity, no stridor, non-tender, trachea midline LYMPH:  no palpable lymphadenopathy, no hepatosplenomegaly BREAST:not  examined LUNGS: clear to auscultation and percussion HEART: regular rate & rhythm, no murmurs, no gallops, S1 normal and S2 normal ABDOMEN:abdomen soft, non-tender, normal bowel sounds and no masses or organomegaly BACK: Back symmetric, no curvature., No CVA tenderness EXTREMITIES:less then 2 second capillary refill, no joint deformities, effusion, or inflammation, no skin discoloration, no cyanosis  NEURO: alert & oriented x 3 with fluent speech, no focal motor/sensory deficits, gait normal   LABORATORY DATA: CBC    Component Value Date/Time   WBC 3.5* 11/21/2014 0915   RBC 3.71* 11/21/2014 0915   HGB 11.7* 11/21/2014 0915   HCT 35.0* 11/21/2014 0915   PLT 99* 11/21/2014 0915   MCV 94.3 11/21/2014 0915   MCH 31.5 11/21/2014 0915   MCHC 33.4 11/21/2014 0915   RDW 20.0* 11/21/2014 0915   LYMPHSABS 1.6 11/21/2014 0915   MONOABS 0.5 11/21/2014 0915   EOSABS 0.0 11/21/2014 0915   BASOSABS 0.0 11/21/2014 0915      Chemistry      Component Value Date/Time   NA 141 11/21/2014 0915   K 3.9 11/21/2014 0915   CL 104 11/21/2014 0915   CO2 28 11/21/2014 0915   BUN 18 11/21/2014 0915   CREATININE 1.21 11/21/2014 0915      Component Value Date/Time   CALCIUM 8.9 11/21/2014 0915   ALKPHOS 45 11/21/2014 0915   AST 30 11/21/2014 0915   ALT 24 11/21/2014 0915   BILITOT 0.9 11/21/2014 0915        ASSESSMENT AND PLAN:  Metastatic urothelial carcinoma Stage IV urothelial carcinoma experiencing a complete metabolic response following 3 cycles of Carboplatin/Gemzar chemotherapy.    Oncology history updated.  Will continue with therapy as planned with the goal of completing 6 cycles of therapy and monitoring remission.  Return in 2 weeks for follow-up.    THERAPY PLAN:  Continue palliative treatment as planned.   All questions were answered. The patient knows to call the clinic with any problems, questions or concerns. We can certainly see the patient much sooner if  necessary.  Patient and plan discussed with Dr. Ancil Linsey and she is in agreement with the aforementioned.   This note is electronically signed by: Robynn Pane 11/21/2014 12:40 PM

## 2014-11-21 NOTE — Patient Instructions (Signed)
[facility] Discharge Instructions for Patients Receiving Chemotherapy  Today you received the following chemotherapy agent:  Gemzar  If you develop nausea and vomiting, or diarrhea that is not controlled by your medication, call the clinic.  The clinic phone number is [phone]. Office hours are Monday-Friday 8:30am-5:00pm.  BELOW ARE SYMPTOMS THAT SHOULD BE REPORTED IMMEDIATELY:  *FEVER GREATER THAN 101.0 F  *CHILLS WITH OR WITHOUT FEVER  NAUSEA AND VOMITING THAT IS NOT CONTROLLED WITH YOUR NAUSEA MEDICATION  *UNUSUAL SHORTNESS OF BREATH  *UNUSUAL BRUISING OR BLEEDING  TENDERNESS IN MOUTH AND THROAT WITH OR WITHOUT PRESENCE OF ULCERS  *URINARY PROBLEMS  *BOWEL PROBLEMS  UNUSUAL RASH Items with * indicate a potential emergency and should be followed up as soon as possible. If you have an emergency after office hours please contact your primary care physician or go to the nearest emergency department.  Please call the clinic during office hours if you have any questions or concerns.   You may also contact the Patient Navigator at [phone] should you have any questions or need assistance in obtaining follow up care. _____________________________________________________________________ Have you asked about our STAR program?    STAR stands for Survivorship Training and Rehabilitation, and this is a nationally recognized cancer care program that focuses on survivorship and rehabilitation.  Cancer and cancer treatments may cause problems, such as, pain, making you feel tired and keeping you from doing the things that you need or want to do. Cancer rehabilitation can help. Our goal is to reduce these troubling effects and help you have the best quality of life possible.  You may receive a survey from a nurse that asks questions about your current state of health.  Based on the survey results, all eligible patients will be referred to the [facility] program for an  evaluation so we can better serve you! A frequently asked questions sheet is available upon request.

## 2014-11-21 NOTE — Patient Instructions (Signed)
[place] at [facility] Discharge Instructions  RECOMMENDATIONS MADE BY THE CONSULTANT AND ANY TEST RESULTS WILL BE SENT TO YOUR REFERRING PHYSICIAN.  Exam completed by Kirby Crigler today Chemotherapy as scheduled today. Return in 2 weeks to see the doctor and have chemotherapy in 2 weeks Please call the clinic if you have any questions or concerns  Thank you for choosing [place] at [facility] to provide your oncology and hematology care.  To afford each patient quality time with our provider, please arrive at least 15 minutes before your scheduled appointment time.    You need to re-schedule your appointment should you arrive 10 or more minutes late.  We strive to give you quality time with our providers, and arriving late affects you and other patients whose appointments are after yours.  Also, if you no show three or more times for appointments you may be dismissed from the clinic at the providers discretion.     Again, thank you for choosing [facility].  Our hope is that these requests will decrease the amount of time that you wait before being seen by our physicians.       _____________________________________________________________  Should you have questions after your visit to [facility], please contact our office at [phone] between the hours of 8:30 a.m. and 4:30 p.m.  Voicemails left after 4:30 p.m. will not be returned until the following business day.  For prescription refill requests, have your pharmacy contact our office.

## 2014-11-21 NOTE — Assessment & Plan Note (Signed)
Stage IV urothelial carcinoma experiencing a complete metabolic response following 3 cycles of Carboplatin/Gemzar chemotherapy.    Oncology history updated.  Will continue with therapy as planned with the goal of completing 6 cycles of therapy and monitoring remission.  Return in 2 weeks for follow-up.

## 2014-11-21 NOTE — Progress Notes (Signed)
1005:  Kirby Crigler, PA notified of platelet count of 99,000.  Okay to treat today per PA.

## 2014-11-21 NOTE — Progress Notes (Signed)
Tolerated chemo well. 

## 2014-12-05 ENCOUNTER — Encounter (HOSPITAL_BASED_OUTPATIENT_CLINIC_OR_DEPARTMENT_OTHER): Payer: Commercial Managed Care - HMO | Admitting: Hematology & Oncology

## 2014-12-05 ENCOUNTER — Encounter (HOSPITAL_COMMUNITY): Payer: Self-pay | Admitting: Hematology & Oncology

## 2014-12-05 ENCOUNTER — Encounter (HOSPITAL_BASED_OUTPATIENT_CLINIC_OR_DEPARTMENT_OTHER): Payer: Commercial Managed Care - HMO

## 2014-12-05 VITALS — BP 114/66 | HR 63 | Temp 98.4°F | Resp 20 | Wt 170.0 lb

## 2014-12-05 DIAGNOSIS — C7910 Secondary malignant neoplasm of unspecified urinary organs: Secondary | ICD-10-CM | POA: Diagnosis not present

## 2014-12-05 DIAGNOSIS — C787 Secondary malignant neoplasm of liver and intrahepatic bile duct: Secondary | ICD-10-CM

## 2014-12-05 DIAGNOSIS — C679 Malignant neoplasm of bladder, unspecified: Secondary | ICD-10-CM

## 2014-12-05 DIAGNOSIS — D696 Thrombocytopenia, unspecified: Secondary | ICD-10-CM

## 2014-12-05 DIAGNOSIS — C689 Malignant neoplasm of urinary organ, unspecified: Secondary | ICD-10-CM

## 2014-12-05 DIAGNOSIS — D500 Iron deficiency anemia secondary to blood loss (chronic): Secondary | ICD-10-CM

## 2014-12-05 LAB — CBC WITH DIFFERENTIAL/PLATELET
BASOS PCT: 0 % (ref 0–1)
Basophils Absolute: 0 10*3/uL (ref 0.0–0.1)
EOS ABS: 0.1 10*3/uL (ref 0.0–0.7)
Eosinophils Relative: 2 % (ref 0–5)
HCT: 35.4 % — ABNORMAL LOW (ref 39.0–52.0)
Hemoglobin: 11.7 g/dL — ABNORMAL LOW (ref 13.0–17.0)
LYMPHS ABS: 1.6 10*3/uL (ref 0.7–4.0)
LYMPHS PCT: 32 % (ref 12–46)
MCH: 32.4 pg (ref 26.0–34.0)
MCHC: 33.1 g/dL (ref 30.0–36.0)
MCV: 98.1 fL (ref 78.0–100.0)
MONOS PCT: 8 % (ref 3–12)
Monocytes Absolute: 0.4 10*3/uL (ref 0.1–1.0)
NEUTROS ABS: 2.8 10*3/uL (ref 1.7–7.7)
NEUTROS PCT: 58 % (ref 43–77)
PLATELETS: 82 10*3/uL — AB (ref 150–400)
RBC: 3.61 MIL/uL — AB (ref 4.22–5.81)
RDW: 19.1 % — ABNORMAL HIGH (ref 11.5–15.5)
WBC: 4.9 10*3/uL (ref 4.0–10.5)

## 2014-12-05 LAB — COMPREHENSIVE METABOLIC PANEL
ALT: 27 U/L (ref 17–63)
AST: 30 U/L (ref 15–41)
Albumin: 4.1 g/dL (ref 3.5–5.0)
Alkaline Phosphatase: 60 U/L (ref 38–126)
Anion gap: 9 (ref 5–15)
BILIRUBIN TOTAL: 0.6 mg/dL (ref 0.3–1.2)
BUN: 22 mg/dL — ABNORMAL HIGH (ref 6–20)
CHLORIDE: 106 mmol/L (ref 101–111)
CO2: 26 mmol/L (ref 22–32)
Calcium: 8.9 mg/dL (ref 8.9–10.3)
Creatinine, Ser: 1.15 mg/dL (ref 0.61–1.24)
Glucose, Bld: 89 mg/dL (ref 65–99)
Potassium: 3.9 mmol/L (ref 3.5–5.1)
Sodium: 141 mmol/L (ref 135–145)
Total Protein: 6.6 g/dL (ref 6.5–8.1)

## 2014-12-05 MED ORDER — HEPARIN SOD (PORK) LOCK FLUSH 100 UNIT/ML IV SOLN
INTRAVENOUS | Status: AC
  Filled 2014-12-05: qty 5

## 2014-12-05 MED ORDER — HEPARIN SOD (PORK) LOCK FLUSH 100 UNIT/ML IV SOLN
500.0000 [IU] | Freq: Once | INTRAVENOUS | Status: AC
  Administered 2014-12-05: 500 [IU] via INTRAVENOUS

## 2014-12-05 MED ORDER — SODIUM CHLORIDE 0.9 % IJ SOLN
10.0000 mL | INTRAMUSCULAR | Status: DC | PRN
  Administered 2014-12-05 (×2): 10 mL via INTRAVENOUS
  Filled 2014-12-05 (×2): qty 10

## 2014-12-05 NOTE — Progress Notes (Signed)
Treatment held today per Dr. Whitney Muse due to platelet count - 74,000.

## 2014-12-05 NOTE — Patient Instructions (Signed)
[place] at [facility] Discharge Instructions  RECOMMENDATIONS MADE BY THE CONSULTANT AND ANY TEST RESULTS WILL BE SENT TO YOUR REFERRING PHYSICIAN.  Exam and discussion by Dr. Whitney Muse. Your platelet count is too low and we cannot safely treat you today.  Will check labs next week and if your blood counts are ok you will be treated.  Report unusual bruising or bleeding.  Results for Jesus [name], Jesus [name] (MRN [id number]) as of 12/05/2014 09:54  Ref. Range 12/05/2014 09:20  Sodium Latest Ref Range: 135-145 mmol/L 141  Potassium Latest Ref Range: 3.5-5.1 mmol/L 3.9  Chloride Latest Ref Range: 101-111 mmol/L 106  CO2 Latest Ref Range: 22-32 mmol/L 26  BUN Latest Ref Range: 6-20 mg/dL 22 (H)  Creatinine Latest Ref Range: 0.61-1.24 mg/dL 1.15  Calcium Latest Ref Range: 8.9-10.3 mg/dL 8.9  EGFR (Non-African Amer.) Latest Ref Range: >60 mL/min >60  EGFR (African American) Latest Ref Range: >60 mL/min >60  Glucose Latest Ref Range: 65-99 mg/dL 89  Anion gap Latest Ref Range: 5-15  9  Alkaline Phosphatase Latest Ref Range: 38-126 U/L 60  Albumin Latest Ref Range: 3.5-5.0 g/dL 4.1  AST Latest Ref Range: 15-41 U/L 30  ALT Latest Ref Range: 17-63 U/L 27  Total Protein Latest Ref Range: 6.5-8.1 g/dL 6.6  Total Bilirubin Latest Ref Range: 0.3-1.2 mg/dL 0.6  WBC Latest Ref Range: 4.0-10.5 K/uL 4.9  RBC Latest Ref Range: 4.22-5.81 MIL/uL 3.61 (L)  Hemoglobin Latest Ref Range: 13.0-17.0 g/dL 11.7 (L)  HCT Latest Ref Range: 39.0-52.0 % 35.4 (L)  MCV Latest Ref Range: 78.0-100.0 fL 98.1  MCH Latest Ref Range: 26.0-34.0 pg 32.4  MCHC Latest Ref Range: 30.0-36.0 g/dL 33.1  RDW Latest Ref Range: 11.5-15.5 % 19.1 (H)  Platelets Latest Ref Range: 150-400 K/uL 82 (L)  Neutrophils Latest Ref Range: 43-77 % 58  Lymphocytes Latest Ref Range: 12-46 % 32  Monocytes Relative Latest Ref Range: 3-12 % 8  Eosinophil Latest Ref Range: 0-5 % 2  Basophil Latest Ref Range: 0-1 % 0  NEUT#  Latest Ref Range: 1.7-7.7 K/uL 2.8  Lymphocyte # Latest Ref Range: 0.7-4.0 K/uL 1.6  Monocyte # Latest Ref Range: 0.1-1.0 K/uL 0.4  Eosinophils Absolute Latest Ref Range: 0.0-0.7 K/uL 0.1  Basophils Absolute Latest Ref Range: 0.0-0.1 K/uL 0.0    Office follow-up in 3 weeks.  Thank you for choosing [place] at [facility] to provide your oncology and hematology care.  To afford each patient quality time with our provider, please arrive at least 15 minutes before your scheduled appointment time.    You need to re-schedule your appointment should you arrive 10 or more minutes late.  We strive to give you quality time with our providers, and arriving late affects you and other patients whose appointments are after yours.  Also, if you no show three or more times for appointments you may be dismissed from the clinic at the providers discretion.     Again, thank you for choosing [facility].  Our hope is that these requests will decrease the amount of time that you wait before being seen by our physicians.       _____________________________________________________________  Should you have questions after your visit to [facility], please contact our office at [phone] between the hours of 8:30 a.m. and 4:30 p.m.  Voicemails left after 4:30 p.m. will not be returned until the following business day.  For prescription refill requests, have  your pharmacy contact our office.

## 2014-12-05 NOTE — Patient Instructions (Addendum)
[facility] Discharge Instructions for Patients Receiving Chemotherapy  We will hold your chemotherapy today due to your low platelet count (74,000). Return as scheduled next week for lab work and chemotherapy. Call the clinic should you have questions or concerns prior to your next appointment.   The clinic phone number is [phone]. Office hours are Monday-Friday 8:30am-5:00pm.  BELOW ARE SYMPTOMS THAT SHOULD BE REPORTED IMMEDIATELY:  *FEVER GREATER THAN 101.0 F  *CHILLS WITH OR WITHOUT FEVER  NAUSEA AND VOMITING THAT IS NOT CONTROLLED WITH YOUR NAUSEA MEDICATION  *UNUSUAL SHORTNESS OF BREATH  *UNUSUAL BRUISING OR BLEEDING  TENDERNESS IN MOUTH AND THROAT WITH OR WITHOUT PRESENCE OF ULCERS  *URINARY PROBLEMS  *BOWEL PROBLEMS  UNUSUAL RASH Items with * indicate a potential emergency and should be followed up as soon as possible. If you have an emergency after office hours please contact your primary care physician or go to the nearest emergency department.  Please call the clinic during office hours if you have any questions or concerns.   You may also contact the Patient Navigator at [phone] should you have any questions or need assistance in obtaining follow up care. _____________________________________________________________________ Have you asked about our STAR program?    STAR stands for Survivorship Training and Rehabilitation, and this is a nationally recognized cancer care program that focuses on survivorship and rehabilitation.  Cancer and cancer treatments may cause problems, such as, pain, making you feel tired and keeping you from doing the things that you need or want to do. Cancer rehabilitation can help. Our goal is to reduce these troubling effects and help you have the best quality of life possible.  You may receive a survey from a nurse that asks questions about your current state of health.  Based on the survey results, all eligible  patients will be referred to the [facility] program for an evaluation so we can better serve you! A frequently asked questions sheet is available upon request.

## 2014-12-05 NOTE — Progress Notes (Signed)
Jesus Cahill, MD  [place] [address]   Metastatic urothelial carcinoma   Staging form: Urinary Bladder, AJCC 7th Edition     Clinical stage from 09/19/2014: Stage IV (T4a, N3, M1) - Signed by Baird Cancer, PA-C on 09/19/2014  CURRENT THERAPY: Carboplatin/Gemzar  INTERVAL HISTORY: Jesus [name] 65 y.o. male returns for followup of stage IV urothelial carcinoma. [facility] Pt denies problems sleeping, nausea, vomiting, hematuria. Pt denies any new pain.  He has no other complaints or concerns. He is here for his next cycle of chemotherapy. He is present alone today.  He is anxious to complete his therapy. He continues to be active.    Metastatic urothelial carcinoma   07/09/2014 Imaging  CT A/P Irregular and lobular bladder wall thickening suspicious for transitional cell carcinoma. Considerable associated pelvic and retroperitoneal adenopathy. 1.4 by 1.3 cm complex and likely enhancing lesion of the right hepatic lobe dome   07/16/2014 - 07/19/2014 Hospital Admission Hospital admission for TURP. Course complicated by ARF   4/43/1540 Procedure TRANSURETHRAL RESECTION OF BLADDER TUMOR WITH GYRUS (TURBT-GYRUS) CYSTOSCOPY WITH PROSTATE ULTRASOUND AND BIOPSY.  Dr. Irine Seal   08/08/2014 PET scan Irregular bladder wall thickening, compatible with known primary bladder neoplasm. Associated mild left hydroureteronephrosis. Extensive retroperitoneal nodal metastases. Associated pelvic implant, suspicious for peritoneal disease.2.3 metastasis liver   08/23/2014 - 10/10/2014 Chemotherapy Carboplatin and Gemzar day 1 and 8 given in the palliative setting   10/10/2014 Treatment Plan Change Changing chemotherapy to every other week and cycle length to 28 day cycle   10/10/2014 -  Chemotherapy Carboplatin/Gemzar day 1 and 15 every 28 days in the palliative setting.   11/04/2014 PET scan Interval complete metabolic response to therapy.  Residual, borderline  left periaortic and right external iliac lymph nodes exhibit low level, nonspecific FDG uptake.    Past Medical History  Diagnosis Date  . High cholesterol   . Depression     hx of   . Arthritis   . Bladder cancer   . Metastatic urothelial carcinoma 07/16/2014    has Metastatic urothelial carcinoma; Acute renal insufficiency; and Iron deficiency anemia due to chronic blood loss on his problem list.     has No Known Allergies.  Current Outpatient Prescriptions on File Prior to Visit  Medication Sig Dispense Refill  . CARBOPLATIN IV Inject into the vein. Starting 08/23/14: Day 1 every 21 days    . folic acid (V-R FOLIC ACID) 086 MCG tablet Take 1 tablet (400 mcg total) by mouth daily. 30 tablet 6  . Gemcitabine HCl (GEMZAR IV) Inject into the vein. Starting 08/23/14: Day 1 & 8 every 21 days    . lidocaine-prilocaine (EMLA) cream Apply a quarter size amount to port site 1 hour prior to chemo. Do not rub in. Cover with plastic wrap. 30 g 3  . ondansetron (ZOFRAN) 8 MG tablet Take 1 tablet (8 mg total) by mouth every 8 (eight) hours as needed for nausea or vomiting. (Patient not taking: Reported on 09/26/2014) 30 tablet 2  . prochlorperazine (COMPAZINE) 10 MG tablet Take 1 tablet (10 mg total) by mouth every 6 (six) hours as needed for nausea or vomiting. (Patient not taking: Reported on 09/26/2014) 30 tablet 2   Current Facility-Administered Medications on File Prior to Visit  Medication Dose Route Frequency Provider Last Rate Last Dose  . sodium chloride 0.9 % injection 10 mL  10 mL  Intravenous PRN Patrici Ranks, MD   10 mL at 12/05/14 1000     Past Surgical History  Procedure Laterality Date  . Knee surgery    . Tonsillectomy    . Transurethral resection of bladder tumor with gyrus (turbt-gyrus) N/A 07/16/2014    Procedure: TRANSURETHRAL RESECTION OF BLADDER TUMOR WITH GYRUS (TURBT-GYRUS);  Surgeon: Malka So, MD;  Location: [facility];  Service: Urology;  Laterality: N/A;  . Cystoscopy  with biopsy N/A 07/16/2014    Procedure: CYSTOSCOPY WITH PROSTATE ULTRASOUND AND BIOPSY;  Surgeon: Malka So, MD;  Location: [facility];  Service: Urology;  Laterality: N/A;   14 point review of systems is performed and is negative except as detailed in history of present illness  14 point review of systems was performed and is negative except as detailed under history of present illness and above    PHYSICAL EXAMINATION  ECOG PERFORMANCE STATUS: 0 - Asymptomatic  Filed Vitals:   12/05/14 0920  BP: 114/66  Pulse: 63  Temp: 98.4 F (36.9 C)  Resp: 20    GENERAL:alert, no distress, well nourished, well developed, comfortable, cooperative and smiling SKIN: skin color, texture, turgor are normal, no rashes or significant lesions. Lipoma on right upper back HEAD: Normocephalic, No masses, lesions, tenderness or abnormalities EYES: normal, PERRLA, EOMI, Conjunctiva are pink and non-injected EARS: External ears normal OROPHARYNX:lips, buccal mucosa, and tongue normal and mucous membranes are moist  NECK: supple, no adenopathy, thyroid normal size, non-tender, without nodularity, no stridor, non-tender, trachea midline LYMPH:  no palpable lymphadenopathy, no hepatosplenomegaly BREAST:not examined LUNGS: clear to auscultation and percussion HEART: regular rate & rhythm, no murmurs, no gallops, S1 normal and S2 normal ABDOMEN:abdomen soft, non-tender, normal bowel sounds and no masses or organomegaly BACK: Back symmetric, no curvature., No CVA tenderness EXTREMITIES:less then 2 second capillary refill, no joint deformities, effusion, or inflammation, no skin discoloration, no cyanosis  NEURO: alert & oriented x 3 with fluent speech, no focal motor/sensory deficits, gait normal   LABORATORY DATA: CBC    Component Value Date/Time   WBC 4.9 12/05/2014 0920   RBC 3.61* 12/05/2014 0920   HGB 11.7* 12/05/2014 0920   HCT 35.4* 12/05/2014 0920   PLT 82* 12/05/2014 0920   MCV 98.1  12/05/2014 0920   MCH 32.4 12/05/2014 0920   MCHC 33.1 12/05/2014 0920   RDW 19.1* 12/05/2014 0920   LYMPHSABS 1.6 12/05/2014 0920   MONOABS 0.4 12/05/2014 0920   EOSABS 0.1 12/05/2014 0920   BASOSABS 0.0 12/05/2014 0920      Chemistry      Component Value Date/Time   NA 141 12/05/2014 0920   K 3.9 12/05/2014 0920   CL 106 12/05/2014 0920   CO2 26 12/05/2014 0920   BUN 22* 12/05/2014 0920   CREATININE 1.15 12/05/2014 0920      Component Value Date/Time   CALCIUM 8.9 12/05/2014 0920   ALKPHOS 60 12/05/2014 0920   AST 30 12/05/2014 0920   ALT 27 12/05/2014 0920   BILITOT 0.6 12/05/2014 0920     RADIOLOGY: I have reviewed the images below and agree with the report:  CLINICAL DATA: Subsequent treatment strategy for metastatic bladder cancer.  EXAM: NUCLEAR MEDICINE PET SKULL BASE TO THIGH IMPRESSION: 1. Interval complete metabolic response to therapy. 2. Residual, borderline left periaortic and right external iliac lymph nodes exhibit low level, nonspecific FDG uptake.   Electronically Signed  By: Kerby Moors M.D.  On: 11/04/2014 15:14    ASSESSMENT AND  PLAN:  Stage IV urothelial carcinoma of the bladder Carboplatin/Gemzar  Patient has done remarkably well with chemotherapy. He has gained weight through treatment and increased his activity level. PET imaging shows complete metabolic response to therapy. Plan will be for completion of 6 cycles of carboplatin and Gemzar.  Treatment will be held today secondary to his platelet counts. I discussed this with him. He is of course disapointed as he wishes to complete treatment as quickly as possible. He will return next week for a CBC, CMP and next cycle of chemotherapy labs permitting. He will return in 3 weeks for an office visit.  The patient knows to call the clinic with any problems, questions or concerns. We can certainly see the patient much sooner if necessary.   This document serves as a record of  services personally performed by Ancil Linsey, MD. It was created on her behalf by Janace Hoard, a trained medical scribe. The creation of this record is based on the scribe's personal observations and the provider's statements to them. This document has been checked and approved by the attending provider.  I have reviewed the above documentation for accuracy and completeness, and I agree with the above. This note was electronically signed Kelby Fam. Zaylen Susman MD

## 2014-12-12 ENCOUNTER — Encounter (HOSPITAL_BASED_OUTPATIENT_CLINIC_OR_DEPARTMENT_OTHER): Payer: Commercial Managed Care - HMO

## 2014-12-12 VITALS — BP 113/75 | HR 67 | Temp 98.4°F | Resp 16

## 2014-12-12 DIAGNOSIS — C679 Malignant neoplasm of bladder, unspecified: Secondary | ICD-10-CM | POA: Diagnosis not present

## 2014-12-12 DIAGNOSIS — C787 Secondary malignant neoplasm of liver and intrahepatic bile duct: Secondary | ICD-10-CM | POA: Diagnosis not present

## 2014-12-12 DIAGNOSIS — C7910 Secondary malignant neoplasm of unspecified urinary organs: Secondary | ICD-10-CM

## 2014-12-12 DIAGNOSIS — Z5111 Encounter for antineoplastic chemotherapy: Secondary | ICD-10-CM

## 2014-12-12 DIAGNOSIS — D500 Iron deficiency anemia secondary to blood loss (chronic): Secondary | ICD-10-CM

## 2014-12-12 LAB — CBC WITH DIFFERENTIAL/PLATELET
BASOS PCT: 0 % (ref 0–1)
Basophils Absolute: 0 10*3/uL (ref 0.0–0.1)
Eosinophils Absolute: 0.1 10*3/uL (ref 0.0–0.7)
Eosinophils Relative: 3 % (ref 0–5)
HCT: 36.4 % — ABNORMAL LOW (ref 39.0–52.0)
Hemoglobin: 12.1 g/dL — ABNORMAL LOW (ref 13.0–17.0)
LYMPHS ABS: 1.5 10*3/uL (ref 0.7–4.0)
Lymphocytes Relative: 40 % (ref 12–46)
MCH: 33 pg (ref 26.0–34.0)
MCHC: 33.2 g/dL (ref 30.0–36.0)
MCV: 99.2 fL (ref 78.0–100.0)
Monocytes Absolute: 0.3 10*3/uL (ref 0.1–1.0)
Monocytes Relative: 8 % (ref 3–12)
Neutro Abs: 1.8 10*3/uL (ref 1.7–7.7)
Neutrophils Relative %: 49 % (ref 43–77)
PLATELETS: 174 10*3/uL (ref 150–400)
RBC: 3.67 MIL/uL — ABNORMAL LOW (ref 4.22–5.81)
RDW: 18.3 % — ABNORMAL HIGH (ref 11.5–15.5)
WBC: 3.7 10*3/uL — ABNORMAL LOW (ref 4.0–10.5)

## 2014-12-12 LAB — COMPREHENSIVE METABOLIC PANEL
ALT: 26 U/L (ref 17–63)
ANION GAP: 9 (ref 5–15)
AST: 30 U/L (ref 15–41)
Albumin: 4.1 g/dL (ref 3.5–5.0)
Alkaline Phosphatase: 52 U/L (ref 38–126)
BILIRUBIN TOTAL: 0.8 mg/dL (ref 0.3–1.2)
BUN: 19 mg/dL (ref 6–20)
CALCIUM: 8.6 mg/dL — AB (ref 8.9–10.3)
CO2: 25 mmol/L (ref 22–32)
CREATININE: 1.13 mg/dL (ref 0.61–1.24)
Chloride: 107 mmol/L (ref 101–111)
GFR calc non Af Amer: >60 mL/min (ref >60)
Glucose, Bld: 110 mg/dL — ABNORMAL HIGH (ref 65–99)
Potassium: 3.8 mmol/L (ref 3.5–5.1)
Sodium: 141 mmol/L (ref 135–145)
Total Protein: 6.7 g/dL (ref 6.5–8.1)

## 2014-12-12 MED ORDER — HEPARIN SOD (PORK) LOCK FLUSH 100 UNIT/ML IV SOLN
500.0000 [IU] | Freq: Once | INTRAVENOUS | Status: AC | PRN
  Administered 2014-12-12: 500 [IU]
  Filled 2014-12-12: qty 5

## 2014-12-12 MED ORDER — DEXAMETHASONE SODIUM PHOSPHATE 10 MG/ML IJ SOLN: 20.0000 mg | Freq: Once | INTRAMUSCULAR | Status: DC

## 2014-12-12 MED ORDER — SODIUM CHLORIDE 0.9 % IV SOLN
750.0000 mg/m2 | Freq: Once | INTRAVENOUS | Status: AC
  Administered 2014-12-12: 1444 mg via INTRAVENOUS
  Filled 2014-12-12: qty 37.98

## 2014-12-12 MED ORDER — SODIUM CHLORIDE 0.9 % IV SOLN
387.6000 mg | Freq: Once | INTRAVENOUS | Status: AC
  Administered 2014-12-12: 390 mg via INTRAVENOUS
  Filled 2014-12-12: qty 39

## 2014-12-12 MED ORDER — SODIUM CHLORIDE 0.9 % IJ SOLN: 10.0000 mL | INTRAMUSCULAR | Status: DC | PRN

## 2014-12-12 MED ORDER — SODIUM CHLORIDE 0.9 % IV SOLN
Freq: Once | INTRAVENOUS | Status: AC
  Administered 2014-12-12: 10:00:00 via INTRAVENOUS

## 2014-12-12 MED ORDER — SODIUM CHLORIDE 0.9 % IV SOLN
Freq: Once | INTRAVENOUS | Status: AC
  Administered 2014-12-12: 8 mg via INTRAVENOUS
  Filled 2014-12-12: qty 4

## 2014-12-12 MED ORDER — SODIUM CHLORIDE 0.9 % IV SOLN: 8.0000 mg | Freq: Once | INTRAVENOUS | Status: DC

## 2014-12-12 NOTE — Progress Notes (Signed)
Tolerated chemo well. 

## 2014-12-19 ENCOUNTER — Ambulatory Visit (HOSPITAL_COMMUNITY): Payer: Commercial Managed Care - HMO | Admitting: Oncology

## 2014-12-19 ENCOUNTER — Inpatient Hospital Stay (HOSPITAL_COMMUNITY): Payer: Commercial Managed Care - HMO

## 2014-12-26 ENCOUNTER — Encounter (HOSPITAL_BASED_OUTPATIENT_CLINIC_OR_DEPARTMENT_OTHER): Payer: Commercial Managed Care - HMO | Admitting: Oncology

## 2014-12-26 ENCOUNTER — Encounter (HOSPITAL_COMMUNITY): Payer: Commercial Managed Care - HMO | Attending: Hematology & Oncology

## 2014-12-26 VITALS — BP 93/61 | HR 52 | Temp 97.8°F | Resp 18

## 2014-12-26 VITALS — BP 109/75 | HR 68 | Temp 98.1°F | Resp 15 | Wt 168.8 lb

## 2014-12-26 DIAGNOSIS — C787 Secondary malignant neoplasm of liver and intrahepatic bile duct: Secondary | ICD-10-CM

## 2014-12-26 DIAGNOSIS — C679 Malignant neoplasm of bladder, unspecified: Secondary | ICD-10-CM

## 2014-12-26 DIAGNOSIS — C7910 Secondary malignant neoplasm of unspecified urinary organs: Secondary | ICD-10-CM

## 2014-12-26 DIAGNOSIS — Z5111 Encounter for antineoplastic chemotherapy: Secondary | ICD-10-CM

## 2014-12-26 DIAGNOSIS — D500 Iron deficiency anemia secondary to blood loss (chronic): Secondary | ICD-10-CM

## 2014-12-26 LAB — CBC WITH DIFFERENTIAL/PLATELET
BASOS ABS: 0 10*3/uL (ref 0.0–0.1)
BASOS PCT: 0 % (ref 0–1)
Eosinophils Absolute: 0 10*3/uL (ref 0.0–0.7)
Eosinophils Relative: 1 % (ref 0–5)
HCT: 37.5 % — ABNORMAL LOW (ref 39.0–52.0)
Hemoglobin: 12.5 g/dL — ABNORMAL LOW (ref 13.0–17.0)
LYMPHS PCT: 39 % (ref 12–46)
Lymphs Abs: 1.4 10*3/uL (ref 0.7–4.0)
MCH: 33.3 pg (ref 26.0–34.0)
MCHC: 33.3 g/dL (ref 30.0–36.0)
MCV: 100 fL (ref 78.0–100.0)
MONO ABS: 0.4 10*3/uL (ref 0.1–1.0)
Monocytes Relative: 12 % (ref 3–12)
NEUTROS ABS: 1.8 10*3/uL (ref 1.7–7.7)
Neutrophils Relative %: 48 % (ref 43–77)
PLATELETS: 112 10*3/uL — AB (ref 150–400)
RBC: 3.75 MIL/uL — ABNORMAL LOW (ref 4.22–5.81)
RDW: 15.4 % (ref 11.5–15.5)
WBC: 3.6 10*3/uL — ABNORMAL LOW (ref 4.0–10.5)

## 2014-12-26 LAB — COMPREHENSIVE METABOLIC PANEL
ALT: 46 U/L (ref 17–63)
AST: 38 U/L (ref 15–41)
Albumin: 4.3 g/dL (ref 3.5–5.0)
Alkaline Phosphatase: 47 U/L (ref 38–126)
Anion gap: 7 (ref 5–15)
BUN: 20 mg/dL (ref 6–20)
CO2: 28 mmol/L (ref 22–32)
Calcium: 9 mg/dL (ref 8.9–10.3)
Chloride: 106 mmol/L (ref 101–111)
Creatinine, Ser: 1.22 mg/dL (ref 0.61–1.24)
GLUCOSE: 130 mg/dL — AB (ref 65–99)
Potassium: 3.7 mmol/L (ref 3.5–5.1)
SODIUM: 141 mmol/L (ref 135–145)
TOTAL PROTEIN: 6.9 g/dL (ref 6.5–8.1)
Total Bilirubin: 0.9 mg/dL (ref 0.3–1.2)

## 2014-12-26 MED ORDER — SODIUM CHLORIDE 0.9 % IV SOLN
Freq: Once | INTRAVENOUS | Status: AC
  Administered 2014-12-26: 11:00:00 via INTRAVENOUS

## 2014-12-26 MED ORDER — SODIUM CHLORIDE 0.9 % IV SOLN: 8.0000 mg | Freq: Once | INTRAVENOUS | Status: DC

## 2014-12-26 MED ORDER — SODIUM CHLORIDE 0.9 % IV SOLN
Freq: Once | INTRAVENOUS | Status: AC
  Administered 2014-12-26: 8 mg via INTRAVENOUS
  Filled 2014-12-26: qty 4

## 2014-12-26 MED ORDER — HEPARIN SOD (PORK) LOCK FLUSH 100 UNIT/ML IV SOLN
500.0000 [IU] | Freq: Once | INTRAVENOUS | Status: AC | PRN
  Administered 2014-12-26: 500 [IU]

## 2014-12-26 MED ORDER — SODIUM CHLORIDE 0.9 % IV SOLN
750.0000 mg/m2 | Freq: Once | INTRAVENOUS | Status: AC
  Administered 2014-12-26: 1444 mg via INTRAVENOUS
  Filled 2014-12-26: qty 37.98

## 2014-12-26 MED ORDER — HEPARIN SOD (PORK) LOCK FLUSH 100 UNIT/ML IV SOLN
INTRAVENOUS | Status: AC
  Filled 2014-12-26: qty 5

## 2014-12-26 MED ORDER — SODIUM CHLORIDE 0.9 % IJ SOLN: 10.0000 mL | INTRAMUSCULAR | Status: DC | PRN

## 2014-12-26 MED ORDER — DEXAMETHASONE SODIUM PHOSPHATE 10 MG/ML IJ SOLN: 20.0000 mg | Freq: Once | INTRAMUSCULAR | Status: DC

## 2014-12-26 NOTE — Assessment & Plan Note (Signed)
Stage IV urothelial carcinoma experiencing a complete metabolic response following 3 cycles of Carboplatin/Gemzar chemotherapy.    Will continue with therapy as planned with the goal of completing 6 cycles of therapy and monitoring remission.  Return in 2 weeks for follow-up.

## 2014-12-26 NOTE — Patient Instructions (Signed)
[place] at [facility] Discharge Instructions  RECOMMENDATIONS MADE BY THE CONSULTANT AND ANY TEST RESULTS WILL BE SENT TO YOUR REFERRING PHYSICIAN.  Exam and discussion by Robynn Pane, PA-C. You are doing well If your blood counts are ok you will be treated today. Report uncontrolled nausea, vomiting or other problems.  Follow-up in 2 weeks.    Thank you for choosing [place] at [facility] to provide your oncology and hematology care.  To afford each patient quality time with our provider, please arrive at least 15 minutes before your scheduled appointment time.    You need to re-schedule your appointment should you arrive 10 or more minutes late.  We strive to give you quality time with our providers, and arriving late affects you and other patients whose appointments are after yours.  Also, if you no show three or more times for appointments you may be dismissed from the clinic at the providers discretion.     Again, thank you for choosing [facility].  Our hope is that these requests will decrease the amount of time that you wait before being seen by our physicians.       _____________________________________________________________  Should you have questions after your visit to [facility], please contact our office at [phone] between the hours of 8:30 a.m. and 4:30 p.m.  Voicemails left after 4:30 p.m. will not be returned until the following business day.  For prescription refill requests, have your pharmacy contact our office.

## 2014-12-26 NOTE — Patient Instructions (Signed)
[facility] Discharge Instructions for Patients Receiving Chemotherapy  Today you received the following chemotherapy agents gemzar Please call the clinic if you have any questions or concerns Follow up as scheduled  To help prevent nausea and vomiting after your treatment, we encourage you to take your nausea medication   If you develop nausea and vomiting, or diarrhea that is not controlled by your medication, call the clinic.  The clinic phone number is [phone]. Office hours are Monday-Friday 8:30am-5:00pm.  BELOW ARE SYMPTOMS THAT SHOULD BE REPORTED IMMEDIATELY:  *FEVER GREATER THAN 101.0 F  *CHILLS WITH OR WITHOUT FEVER  NAUSEA AND VOMITING THAT IS NOT CONTROLLED WITH YOUR NAUSEA MEDICATION  *UNUSUAL SHORTNESS OF BREATH  *UNUSUAL BRUISING OR BLEEDING  TENDERNESS IN MOUTH AND THROAT WITH OR WITHOUT PRESENCE OF ULCERS  *URINARY PROBLEMS  *BOWEL PROBLEMS  UNUSUAL RASH Items with * indicate a potential emergency and should be followed up as soon as possible. If you have an emergency after office hours please contact your primary care physician or go to the nearest emergency department.  Please call the clinic during office hours if you have any questions or concerns.   You may also contact the Patient Navigator at [phone] should you have any questions or need assistance in obtaining follow up care. _____________________________________________________________________ Have you asked about our STAR program?    STAR stands for Survivorship Training and Rehabilitation, and this is a nationally recognized cancer care program that focuses on survivorship and rehabilitation.  Cancer and cancer treatments may cause problems, such as, pain, making you feel tired and keeping you from doing the things that you need or want to do. Cancer rehabilitation can help. Our goal is to reduce these troubling effects and help you have the best quality of life  possible.  You may receive a survey from a nurse that asks questions about your current state of health.  Based on the survey results, all eligible patients will be referred to the [facility] program for an evaluation so we can better serve you! A frequently asked questions sheet is available upon request.

## 2014-12-26 NOTE — Progress Notes (Signed)
Delphina Cahill, MD  [place] [address]  Metastatic urothelial carcinoma  CURRENT THERAPY: Carboplatin/Gemzar  INTERVAL HISTORY: Jesus [name] 64 y.o. male returns for followup of stage IV urothelial carcinoma.     Metastatic urothelial carcinoma   07/09/2014 Imaging  CT A/P Irregular and lobular bladder wall thickening suspicious for transitional cell carcinoma. Considerable associated pelvic and retroperitoneal adenopathy. 1.4 by 1.3 cm complex and likely enhancing lesion of the right hepatic lobe dome   07/16/2014 - 07/19/2014 Hospital Admission Hospital admission for TURP. Course complicated by ARF   9/62/8366 Procedure TRANSURETHRAL RESECTION OF BLADDER TUMOR WITH GYRUS (TURBT-GYRUS) CYSTOSCOPY WITH PROSTATE ULTRASOUND AND BIOPSY.  Dr. Irine Seal   08/08/2014 PET scan Irregular bladder wall thickening, compatible with known primary bladder neoplasm. Associated mild left hydroureteronephrosis. Extensive retroperitoneal nodal metastases. Associated pelvic implant, suspicious for peritoneal disease.2.3 metastasis liver   08/23/2014 - 10/10/2014 Chemotherapy Carboplatin and Gemzar day 1 and 8 given in the palliative setting   10/10/2014 Treatment Plan Change Changing chemotherapy to every other week and cycle length to 28 day cycle   10/10/2014 -  Chemotherapy Carboplatin/Gemzar day 1 and 15 every 28 days in the palliative setting.   11/04/2014 PET scan Interval complete metabolic response to therapy.  Residual, borderline left periaortic and right external iliac lymph nodes exhibit low level, nonspecific FDG uptake.    I personally reviewed and went over laboratory results with the patient.  The results are noted within this dictation.    He continues to tolerate therapy without any side effects at this time. He is excited to complete therapy in the near future.  Past Medical History  Diagnosis Date  . High cholesterol   . Depression     hx of   . Arthritis   .  Bladder cancer   . Metastatic urothelial carcinoma 07/16/2014    has Metastatic urothelial carcinoma; Acute renal insufficiency; and Iron deficiency anemia due to chronic blood loss on his problem list.     has No Known Allergies.  Mr. Graveline does not currently have medications on file.  Past Surgical History  Procedure Laterality Date  . Knee surgery    . Tonsillectomy    . Transurethral resection of bladder tumor with gyrus (turbt-gyrus) N/A 07/16/2014    Procedure: TRANSURETHRAL RESECTION OF BLADDER TUMOR WITH GYRUS (TURBT-GYRUS);  Surgeon: Malka So, MD;  Location: [facility];  Service: Urology;  Laterality: N/A;  . Cystoscopy with biopsy N/A 07/16/2014    Procedure: CYSTOSCOPY WITH PROSTATE ULTRASOUND AND BIOPSY;  Surgeon: Malka So, MD;  Location: [facility];  Service: Urology;  Laterality: N/A;    Denies any headaches, dizziness, double vision, fevers, chills, night sweats, nausea, vomiting, diarrhea, constipation, chest pain, heart palpitations, shortness of breath, blood in stool, black tarry stool, urinary pain, urinary burning, hematuria.   PHYSICAL EXAMINATION  ECOG PERFORMANCE STATUS: 0 - Asymptomatic  Filed Vitals:   12/26/14 0951  BP: 109/75  Pulse: 68  Temp: 98.1 F (36.7 C)  Resp: 15    GENERAL:alert, no distress, well nourished, well developed, comfortable, cooperative and smiling SKIN: skin color, texture, turgor are normal, no rashes or significant lesions HEAD: Normocephalic, No masses, lesions, tenderness or abnormalities EYES: normal, PERRLA, EOMI, Conjunctiva are pink and non-injected EARS: External ears normal OROPHARYNX:lips, buccal mucosa, and tongue normal and mucous membranes are moist  NECK: supple, no adenopathy, thyroid normal size, non-tender, without nodularity, no stridor, non-tender, trachea  midline LYMPH:  no palpable lymphadenopathy, no hepatosplenomegaly BREAST:not examined LUNGS: clear to auscultation and percussion HEART: regular rate  & rhythm, no murmurs, no gallops, S1 normal and S2 normal ABDOMEN:abdomen soft, non-tender, normal bowel sounds and no masses or organomegaly BACK: Back symmetric, no curvature., No CVA tenderness EXTREMITIES:less then 2 second capillary refill, no joint deformities, effusion, or inflammation, no skin discoloration, no cyanosis  NEURO: alert & oriented x 3 with fluent speech, no focal motor/sensory deficits, gait normal   LABORATORY DATA: CBC    Component Value Date/Time   WBC 3.7* 12/12/2014 1003   RBC 3.67* 12/12/2014 1003   HGB 12.1* 12/12/2014 1003   HCT 36.4* 12/12/2014 1003   PLT 174 12/12/2014 1003   MCV 99.2 12/12/2014 1003   MCH 33.0 12/12/2014 1003   MCHC 33.2 12/12/2014 1003   RDW 18.3* 12/12/2014 1003   LYMPHSABS 1.5 12/12/2014 1003   MONOABS 0.3 12/12/2014 1003   EOSABS 0.1 12/12/2014 1003   BASOSABS 0.0 12/12/2014 1003      Chemistry      Component Value Date/Time   NA 141 12/12/2014 1003   K 3.8 12/12/2014 1003   CL 107 12/12/2014 1003   CO2 25 12/12/2014 1003   BUN 19 12/12/2014 1003   CREATININE 1.13 12/12/2014 1003      Component Value Date/Time   CALCIUM 8.6* 12/12/2014 1003   ALKPHOS 52 12/12/2014 1003   AST 30 12/12/2014 1003   ALT 26 12/12/2014 1003   BILITOT 0.8 12/12/2014 1003        ASSESSMENT AND PLAN:  Metastatic urothelial carcinoma Stage IV urothelial carcinoma experiencing a complete metabolic response following 3 cycles of Carboplatin/Gemzar chemotherapy.    Will continue with therapy as planned with the goal of completing 6 cycles of therapy and monitoring remission.  Return in 2 weeks for follow-up.    THERAPY PLAN:  Continue palliative treatment as planned with the goal to complete 6 Cycles of therapy.   All questions were answered. The patient knows to call the clinic with any problems, questions or concerns. We can certainly see the patient much sooner if necessary.  Patient and plan discussed with Dr. Ancil Linsey  and she is in agreement with the aforementioned.   This note is electronically signed by: Robynn Pane 12/26/2014 10:12 AM

## 2014-12-26 NOTE — Progress Notes (Signed)
[name] Tolerated chemotherapy well today discharged ambulatory

## 2015-01-09 ENCOUNTER — Inpatient Hospital Stay (HOSPITAL_COMMUNITY): Payer: Commercial Managed Care - HMO

## 2015-01-09 ENCOUNTER — Encounter (HOSPITAL_BASED_OUTPATIENT_CLINIC_OR_DEPARTMENT_OTHER): Payer: Commercial Managed Care - HMO

## 2015-01-09 ENCOUNTER — Encounter (HOSPITAL_BASED_OUTPATIENT_CLINIC_OR_DEPARTMENT_OTHER): Payer: Commercial Managed Care - HMO | Admitting: Oncology

## 2015-01-09 ENCOUNTER — Encounter (HOSPITAL_COMMUNITY): Payer: Self-pay | Admitting: Oncology

## 2015-01-09 VITALS — BP 135/76 | HR 53 | Temp 98.0°F | Resp 16 | Wt 174.6 lb

## 2015-01-09 VITALS — BP 114/71 | HR 50 | Temp 97.7°F | Resp 16

## 2015-01-09 DIAGNOSIS — C679 Malignant neoplasm of bladder, unspecified: Secondary | ICD-10-CM | POA: Diagnosis not present

## 2015-01-09 DIAGNOSIS — C787 Secondary malignant neoplasm of liver and intrahepatic bile duct: Secondary | ICD-10-CM | POA: Diagnosis not present

## 2015-01-09 DIAGNOSIS — Z5111 Encounter for antineoplastic chemotherapy: Secondary | ICD-10-CM | POA: Diagnosis not present

## 2015-01-09 DIAGNOSIS — C7910 Secondary malignant neoplasm of unspecified urinary organs: Secondary | ICD-10-CM

## 2015-01-09 DIAGNOSIS — D500 Iron deficiency anemia secondary to blood loss (chronic): Secondary | ICD-10-CM

## 2015-01-09 LAB — CBC WITH DIFFERENTIAL/PLATELET
BASOS ABS: 0 10*3/uL (ref 0.0–0.1)
Basophils Relative: 1 % (ref 0–1)
Eosinophils Absolute: 0.1 10*3/uL (ref 0.0–0.7)
Eosinophils Relative: 1 % (ref 0–5)
HCT: 37.2 % — ABNORMAL LOW (ref 39.0–52.0)
Hemoglobin: 12.6 g/dL — ABNORMAL LOW (ref 13.0–17.0)
Lymphocytes Relative: 39 % (ref 12–46)
Lymphs Abs: 2.1 10*3/uL (ref 0.7–4.0)
MCH: 33.9 pg (ref 26.0–34.0)
MCHC: 33.9 g/dL (ref 30.0–36.0)
MCV: 100 fL (ref 78.0–100.0)
Monocytes Absolute: 0.6 10*3/uL (ref 0.1–1.0)
Monocytes Relative: 10 % (ref 3–12)
NEUTROS ABS: 2.6 10*3/uL (ref 1.7–7.7)
Neutrophils Relative %: 49 % (ref 43–77)
Platelets: 120 10*3/uL — ABNORMAL LOW (ref 150–400)
RBC: 3.72 MIL/uL — AB (ref 4.22–5.81)
RDW: 14.2 % (ref 11.5–15.5)
WBC: 5.4 10*3/uL (ref 4.0–10.5)

## 2015-01-09 LAB — COMPREHENSIVE METABOLIC PANEL
ALT: 44 U/L (ref 17–63)
AST: 37 U/L (ref 15–41)
Albumin: 4.1 g/dL (ref 3.5–5.0)
Alkaline Phosphatase: 52 U/L (ref 38–126)
Anion gap: 8 (ref 5–15)
BUN: 20 mg/dL (ref 6–20)
CO2: 26 mmol/L (ref 22–32)
Calcium: 8.6 mg/dL — ABNORMAL LOW (ref 8.9–10.3)
Chloride: 104 mmol/L (ref 101–111)
Creatinine, Ser: 1.08 mg/dL (ref 0.61–1.24)
GFR calc Af Amer: >60 mL/min (ref >60)
GFR calc non Af Amer: >60 mL/min (ref >60)
Glucose, Bld: 98 mg/dL (ref 65–99)
Potassium: 3.9 mmol/L (ref 3.5–5.1)
Sodium: 138 mmol/L (ref 135–145)
Total Bilirubin: 0.6 mg/dL (ref 0.3–1.2)
Total Protein: 6.9 g/dL (ref 6.5–8.1)

## 2015-01-09 MED ORDER — SODIUM CHLORIDE 0.9 % IV SOLN
Freq: Once | INTRAVENOUS | Status: AC
  Administered 2015-01-09: 09:00:00 via INTRAVENOUS

## 2015-01-09 MED ORDER — SODIUM CHLORIDE 0.9 % IJ SOLN: 10.0000 mL | INTRAMUSCULAR | Status: DC | PRN

## 2015-01-09 MED ORDER — HEPARIN SOD (PORK) LOCK FLUSH 100 UNIT/ML IV SOLN
500.0000 [IU] | Freq: Once | INTRAVENOUS | Status: AC | PRN
  Administered 2015-01-09: 500 [IU]
  Filled 2015-01-09: qty 5

## 2015-01-09 MED ORDER — ONDANSETRON HCL 40 MG/20ML IJ SOLN
Freq: Once | INTRAMUSCULAR | Status: AC
  Administered 2015-01-09: 8 mg via INTRAVENOUS
  Filled 2015-01-09: qty 4

## 2015-01-09 MED ORDER — CARBOPLATIN CHEMO INJECTION 450 MG/45ML
390.0000 mg | Freq: Once | INTRAVENOUS | Status: AC
  Administered 2015-01-09: 390 mg via INTRAVENOUS
  Filled 2015-01-09: qty 39

## 2015-01-09 MED ORDER — GEMCITABINE HCL CHEMO INJECTION 1 GM/26.3ML
750.0000 mg/m2 | Freq: Once | INTRAVENOUS | Status: AC
  Administered 2015-01-09: 1444 mg via INTRAVENOUS
  Filled 2015-01-09: qty 37.98

## 2015-01-09 NOTE — Progress Notes (Signed)
Delphina Cahill, MD  [place] [address]  Metastatic urothelial carcinoma - Plan: NM PET Image Restag (PS) Skull Base To Thigh  CURRENT THERAPY: Carboplatin/Gemzar  INTERVAL HISTORY: Jesus [name] 64 y.o. male returns for followup of stage IV urothelial carcinoma.     Metastatic urothelial carcinoma   07/09/2014 Imaging  CT A/P Irregular and lobular bladder wall thickening suspicious for transitional cell carcinoma. Considerable associated pelvic and retroperitoneal adenopathy. 1.4 by 1.3 cm complex and likely enhancing lesion of the right hepatic lobe dome   07/16/2014 - 07/19/2014 Hospital Admission Hospital admission for TURP. Course complicated by ARF   12/25/2374 Procedure TRANSURETHRAL RESECTION OF BLADDER TUMOR WITH GYRUS (TURBT-GYRUS) CYSTOSCOPY WITH PROSTATE ULTRASOUND AND BIOPSY.  Dr. Irine Seal   08/08/2014 PET scan Irregular bladder wall thickening, compatible with known primary bladder neoplasm. Associated mild left hydroureteronephrosis. Extensive retroperitoneal nodal metastases. Associated pelvic implant, suspicious for peritoneal disease.2.3 metastasis liver   08/23/2014 - 10/10/2014 Chemotherapy Carboplatin and Gemzar day 1 and 8 given in the palliative setting   10/10/2014 Treatment Plan Change Changing chemotherapy to every other week and cycle length to 28 day cycle   10/10/2014 -  Chemotherapy Carboplatin/Gemzar day 1 and 15 every 28 days in the palliative setting.   11/04/2014 PET scan Interval complete metabolic response to therapy.  Residual, borderline left periaortic and right external iliac lymph nodes exhibit low level, nonspecific FDG uptake.    I personally reviewed and went over laboratory results with the patient.  The results are noted within this dictation.  Counts are good today for treatment today.   He continues to tolerate therapy without any side effects at this time. He is excited to complete therapy in the near future.  He denies  any complaints.  Past Medical History  Diagnosis Date  . High cholesterol   . Depression     hx of   . Arthritis   . Bladder cancer   . Metastatic urothelial carcinoma 07/16/2014    has Metastatic urothelial carcinoma and Iron deficiency anemia due to chronic blood loss on his problem list.     has No Known Allergies.  Mr. Camilli does not currently have medications on file.  Past Surgical History  Procedure Laterality Date  . Knee surgery    . Tonsillectomy    . Transurethral resection of bladder tumor with gyrus (turbt-gyrus) N/A 07/16/2014    Procedure: TRANSURETHRAL RESECTION OF BLADDER TUMOR WITH GYRUS (TURBT-GYRUS);  Surgeon: Malka So, MD;  Location: [facility];  Service: Urology;  Laterality: N/A;  . Cystoscopy with biopsy N/A 07/16/2014    Procedure: CYSTOSCOPY WITH PROSTATE ULTRASOUND AND BIOPSY;  Surgeon: Malka So, MD;  Location: [facility];  Service: Urology;  Laterality: N/A;    Denies any headaches, dizziness, double vision, fevers, chills, night sweats, nausea, vomiting, diarrhea, constipation, chest pain, heart palpitations, shortness of breath, blood in stool, black tarry stool, urinary pain, urinary burning, hematuria.   PHYSICAL EXAMINATION  ECOG PERFORMANCE STATUS: 0 - Asymptomatic  Filed Vitals:   01/09/15 0800  BP: 135/76  Pulse: 53  Temp: 98 F (36.7 C)  Resp: 16    GENERAL:alert, no distress, well nourished, well developed, comfortable, cooperative and smiling SKIN: skin color, texture, turgor are normal, no rashes or significant lesions HEAD: Normocephalic, No masses, lesions, tenderness or abnormalities EYES: normal, PERRLA, EOMI, Conjunctiva are pink and non-injected EARS: External ears normal OROPHARYNX:lips, buccal mucosa, and tongue normal  and mucous membranes are moist  NECK: supple, no adenopathy, thyroid normal size, non-tender, without nodularity, no stridor, non-tender, trachea midline LYMPH:  no palpable lymphadenopathy, no  hepatosplenomegaly BREAST:not examined LUNGS: clear to auscultation and percussion HEART: regular rate & rhythm, no murmurs, no gallops, S1 normal and S2 normal ABDOMEN:abdomen soft, non-tender, normal bowel sounds and no masses or organomegaly BACK: Back symmetric, no curvature., No CVA tenderness EXTREMITIES:less then 2 second capillary refill, no joint deformities, effusion, or inflammation, no skin discoloration, no cyanosis  NEURO: alert & oriented x 3 with fluent speech, no focal motor/sensory deficits, gait normal   LABORATORY DATA: CBC    Component Value Date/Time   WBC 5.4 01/09/2015 0910   RBC 3.72* 01/09/2015 0910   HGB 12.6* 01/09/2015 0910   HCT 37.2* 01/09/2015 0910   PLT 120* 01/09/2015 0910   MCV 100.0 01/09/2015 0910   MCH 33.9 01/09/2015 0910   MCHC 33.9 01/09/2015 0910   RDW 14.2 01/09/2015 0910   LYMPHSABS 2.1 01/09/2015 0910   MONOABS 0.6 01/09/2015 0910   EOSABS 0.1 01/09/2015 0910   BASOSABS 0.0 01/09/2015 0910      Chemistry      Component Value Date/Time   NA 138 01/09/2015 0910   K 3.9 01/09/2015 0910   CL 104 01/09/2015 0910   CO2 26 01/09/2015 0910   BUN 20 01/09/2015 0910   CREATININE 1.08 01/09/2015 0910      Component Value Date/Time   CALCIUM 8.6* 01/09/2015 0910   ALKPHOS 52 01/09/2015 0910   AST 37 01/09/2015 0910   ALT 44 01/09/2015 0910   BILITOT 0.6 01/09/2015 0910        ASSESSMENT AND PLAN:  Metastatic urothelial carcinoma Stage IV urothelial carcinoma experiencing a complete metabolic response following 3 cycles of Carboplatin/Gemzar chemotherapy.    Will continue with therapy as planned with the goal of completing 6 cycles of therapy and monitoring remission.  If counts are not maintained for treatment in 2 weeks, I will cancel day 15 of cycle 6 and I will discontinue treatment.  PET scan in 6 weeks to evaluate response to therapy and to stage.  Pre-chemo labs in 2 weeks  Return in 2 weeks for  follow-up.    THERAPY PLAN:  Continue palliative treatment as planned with the goal to complete 6 Cycles of therapy.   All questions were answered. The patient knows to call the clinic with any problems, questions or concerns. We can certainly see the patient much sooner if necessary.  Patient and plan discussed with Dr. Ancil Linsey and she is in agreement with the aforementioned.   This note is electronically signed by: Robynn Pane 01/09/2015 10:22 AM

## 2015-01-09 NOTE — Progress Notes (Signed)
Patient tolerated infusion well, VSS.  He understands his return appointment.

## 2015-01-09 NOTE — Assessment & Plan Note (Signed)
Stage IV urothelial carcinoma experiencing a complete metabolic response following 3 cycles of Carboplatin/Gemzar chemotherapy.    Will continue with therapy as planned with the goal of completing 6 cycles of therapy and monitoring remission.  If counts are not maintained for treatment in 2 weeks, I will cancel day 15 of cycle 6 and I will discontinue treatment.  PET scan in 6 weeks to evaluate response to therapy and to stage.  Pre-chemo labs in 2 weeks  Return in 2 weeks for follow-up.

## 2015-01-09 NOTE — Patient Instructions (Signed)
[place] at [facility] Discharge Instructions  RECOMMENDATIONS MADE BY THE CONSULTANT AND ANY TEST RESULTS WILL BE SENT TO YOUR REFERRING PHYSICIAN.  Exam completed by Kirby Crigler today Return in 2 weeks for follow up with the doctor and chemotherapy Chemotherapy as scheduled today Please call the clinic if you have any questions or concerns  Thank you for choosing [place] at [facility] to provide your oncology and hematology care.  To afford each patient quality time with our provider, please arrive at least 15 minutes before your scheduled appointment time.    You need to re-schedule your appointment should you arrive 10 or more minutes late.  We strive to give you quality time with our providers, and arriving late affects you and other patients whose appointments are after yours.  Also, if you no show three or more times for appointments you may be dismissed from the clinic at the providers discretion.     Again, thank you for choosing [facility].  Our hope is that these requests will decrease the amount of time that you wait before being seen by our physicians.       _____________________________________________________________  Should you have questions after your visit to [facility], please contact our office at [phone] between the hours of 8:30 a.m. and 4:30 p.m.  Voicemails left after 4:30 p.m. will not be returned until the following business day.  For prescription refill requests, have your pharmacy contact our office.

## 2015-01-09 NOTE — Patient Instructions (Signed)
..  [facility] Discharge Instructions for Patients Receiving Chemotherapy  Today you received the following chemotherapy agents Gemzar and Carbo.    To help prevent nausea and vomiting after your treatment, we encourage you to take your nausea medication Zofran 8mg  daily Days 2,3,4 post chemo Begin taking it as needed and take it as often as prescribed for the next 48 hours.   If you develop nausea and vomiting, or diarrhea that is not controlled by your medication, call the clinic.  The clinic phone number is [phone]. Office hours are Monday-Friday 8:30am-5:00pm.  BELOW ARE SYMPTOMS THAT SHOULD BE REPORTED IMMEDIATELY:  *FEVER GREATER THAN 101.0 F  *CHILLS WITH OR WITHOUT FEVER  NAUSEA AND VOMITING THAT IS NOT CONTROLLED WITH YOUR NAUSEA MEDICATION  *UNUSUAL SHORTNESS OF BREATH  *UNUSUAL BRUISING OR BLEEDING  TENDERNESS IN MOUTH AND THROAT WITH OR WITHOUT PRESENCE OF ULCERS  *URINARY PROBLEMS  *BOWEL PROBLEMS  UNUSUAL RASH Items with * indicate a potential emergency and should be followed up as soon as possible. If you have an emergency after office hours please contact your primary care physician or go to the nearest emergency department.  Please call the clinic during office hours if you have any questions or concerns.   You may also contact the Patient Navigator at [phone] should you have any questions or need assistance in obtaining follow up care. _____________________________________________________________________ Have you asked about our STAR program?    STAR stands for Survivorship Training and Rehabilitation, and this is a nationally recognized cancer care program that focuses on survivorship and rehabilitation.  Cancer and cancer treatments may cause problems, such as, pain, making you feel tired and keeping you from doing the things that you need or want to do. Cancer rehabilitation can help. Our goal is to reduce these troubling effects  and help you have the best quality of life possible.  You may receive a survey from a nurse that asks questions about your current state of health.  Based on the survey results, all eligible patients will be referred to the [facility] program for an evaluation so we can better serve you! A frequently asked questions sheet is available upon request.

## 2015-01-23 ENCOUNTER — Inpatient Hospital Stay (HOSPITAL_COMMUNITY): Payer: Commercial Managed Care - HMO

## 2015-01-23 ENCOUNTER — Encounter (HOSPITAL_COMMUNITY): Payer: Commercial Managed Care - HMO | Attending: Hematology & Oncology | Admitting: Oncology

## 2015-01-23 ENCOUNTER — Encounter (HOSPITAL_COMMUNITY): Payer: Self-pay | Admitting: Oncology

## 2015-01-23 ENCOUNTER — Encounter (HOSPITAL_BASED_OUTPATIENT_CLINIC_OR_DEPARTMENT_OTHER): Payer: Commercial Managed Care - HMO

## 2015-01-23 VITALS — BP 118/74 | HR 51 | Temp 97.5°F | Resp 16

## 2015-01-23 VITALS — BP 109/68 | HR 72 | Temp 98.0°F | Resp 14 | Wt 173.2 lb

## 2015-01-23 DIAGNOSIS — C679 Malignant neoplasm of bladder, unspecified: Secondary | ICD-10-CM | POA: Diagnosis not present

## 2015-01-23 DIAGNOSIS — C7910 Secondary malignant neoplasm of unspecified urinary organs: Secondary | ICD-10-CM

## 2015-01-23 DIAGNOSIS — C787 Secondary malignant neoplasm of liver and intrahepatic bile duct: Secondary | ICD-10-CM | POA: Diagnosis not present

## 2015-01-23 DIAGNOSIS — Z5111 Encounter for antineoplastic chemotherapy: Secondary | ICD-10-CM | POA: Diagnosis not present

## 2015-01-23 DIAGNOSIS — D500 Iron deficiency anemia secondary to blood loss (chronic): Secondary | ICD-10-CM

## 2015-01-23 LAB — CBC WITH DIFFERENTIAL/PLATELET
BASOS PCT: 0 % (ref 0–1)
Basophils Absolute: 0 10*3/uL (ref 0.0–0.1)
Eosinophils Absolute: 0.1 10*3/uL (ref 0.0–0.7)
Eosinophils Relative: 1 % (ref 0–5)
HEMATOCRIT: 37.2 % — AB (ref 39.0–52.0)
Hemoglobin: 12.6 g/dL — ABNORMAL LOW (ref 13.0–17.0)
LYMPHS PCT: 34 % (ref 12–46)
Lymphs Abs: 1.6 10*3/uL (ref 0.7–4.0)
MCH: 34.1 pg — AB (ref 26.0–34.0)
MCHC: 33.9 g/dL (ref 30.0–36.0)
MCV: 100.8 fL — ABNORMAL HIGH (ref 78.0–100.0)
MONO ABS: 0.6 10*3/uL (ref 0.1–1.0)
Monocytes Relative: 12 % (ref 3–12)
NEUTROS PCT: 53 % (ref 43–77)
Neutro Abs: 2.4 10*3/uL (ref 1.7–7.7)
Platelets: 125 10*3/uL — ABNORMAL LOW (ref 150–400)
RBC: 3.69 MIL/uL — AB (ref 4.22–5.81)
RDW: 13.7 % (ref 11.5–15.5)
WBC: 4.6 10*3/uL (ref 4.0–10.5)

## 2015-01-23 LAB — COMPREHENSIVE METABOLIC PANEL
ALT: 32 U/L (ref 17–63)
AST: 34 U/L (ref 15–41)
Albumin: 4.2 g/dL (ref 3.5–5.0)
Alkaline Phosphatase: 60 U/L (ref 38–126)
Anion gap: 6 (ref 5–15)
BUN: 21 mg/dL — AB (ref 6–20)
CO2: 27 mmol/L (ref 22–32)
Calcium: 9.2 mg/dL (ref 8.9–10.3)
Chloride: 106 mmol/L (ref 101–111)
Creatinine, Ser: 1.17 mg/dL (ref 0.61–1.24)
GFR calc Af Amer: >60 mL/min (ref >60)
GLUCOSE: 83 mg/dL (ref 65–99)
Potassium: 4 mmol/L (ref 3.5–5.1)
SODIUM: 139 mmol/L (ref 135–145)
Total Bilirubin: 0.7 mg/dL (ref 0.3–1.2)
Total Protein: 6.8 g/dL (ref 6.5–8.1)

## 2015-01-23 MED ORDER — SODIUM CHLORIDE 0.9 % IJ SOLN
10.0000 mL | INTRAMUSCULAR | Status: DC | PRN
  Administered 2015-01-23: 10 mL
  Filled 2015-01-23: qty 10

## 2015-01-23 MED ORDER — HEPARIN SOD (PORK) LOCK FLUSH 100 UNIT/ML IV SOLN
500.0000 [IU] | Freq: Once | INTRAVENOUS | Status: AC | PRN
  Administered 2015-01-23: 500 [IU]

## 2015-01-23 MED ORDER — HEPARIN SOD (PORK) LOCK FLUSH 100 UNIT/ML IV SOLN
INTRAVENOUS | Status: AC
  Filled 2015-01-23: qty 5

## 2015-01-23 MED ORDER — SODIUM CHLORIDE 0.9 % IV SOLN
Freq: Once | INTRAVENOUS | Status: AC
  Administered 2015-01-23: 8 mg via INTRAVENOUS
  Filled 2015-01-23: qty 4

## 2015-01-23 MED ORDER — SODIUM CHLORIDE 0.9 % IV SOLN
Freq: Once | INTRAVENOUS | Status: AC
  Administered 2015-01-23: 10:00:00 via INTRAVENOUS

## 2015-01-23 MED ORDER — SODIUM CHLORIDE 0.9 % IV SOLN
750.0000 mg/m2 | Freq: Once | INTRAVENOUS | Status: AC
  Administered 2015-01-23: 1444 mg via INTRAVENOUS
  Filled 2015-01-23: qty 37.98

## 2015-01-23 NOTE — Assessment & Plan Note (Signed)
Stage IV urothelial carcinoma experiencing a complete metabolic response following 3 cycles of Carboplatin/Gemzar chemotherapy.    Today marks Jesus [name] last treatment day.    Oncology history is updated.  PET scheduled for 02/20/2015.  Return as scheduled on 02/25/2015 for follow-up.

## 2015-01-23 NOTE — Progress Notes (Signed)
Jesus [name], [place] [address]  No diagnosis found.  CURRENT THERAPY: Carboplatin/Gemzar  INTERVAL HISTORY: Jesus [name] 64 y.o. male returns for followup of stage IV urothelial carcinoma.     Metastatic urothelial carcinoma   07/09/2014 Imaging  CT A/P Irregular and lobular bladder wall thickening suspicious for transitional cell carcinoma. Considerable associated pelvic and retroperitoneal adenopathy. 1.4 by 1.3 cm complex and likely enhancing lesion of the right hepatic lobe dome   07/16/2014 - 07/19/2014 Hospital Admission Hospital admission for TURP. Course complicated by ARF   12/20/6376 Procedure TRANSURETHRAL RESECTION OF BLADDER TUMOR WITH GYRUS (TURBT-GYRUS) CYSTOSCOPY WITH PROSTATE ULTRASOUND AND BIOPSY.  Dr. Irine Seal   08/08/2014 PET scan Irregular bladder wall thickening, compatible with known primary bladder neoplasm. Associated mild left hydroureteronephrosis. Extensive retroperitoneal nodal metastases. Associated pelvic implant, suspicious for peritoneal disease.2.3 metastasis liver   08/23/2014 - 10/10/2014 Chemotherapy Carboplatin and Gemzar day 1 and 8 given in the palliative setting   10/10/2014 Treatment Plan Change Changing chemotherapy to every other week and cycle length to 28 day cycle   10/10/2014 -  Chemotherapy Carboplatin/Gemzar day 1 and 15 every 28 days in the palliative setting.   11/04/2014 PET scan Interval complete metabolic response to therapy.  Residual, borderline left periaortic and right external iliac lymph nodes exhibit low level, nonspecific FDG uptake.    I personally reviewed and went over laboratory results with the patient.  The results are noted within this dictation.  Counts are good today for treatment today.   He continues to tolerate therapy without any side effects at this time. He is excited to complete therapy today.  If his counts meet treatment parameters, he will be treated.  If not, we will cancel  today and discontinue treatment.  He denies any complaints.  Past Medical History  Diagnosis Date  . High cholesterol   . Depression     hx of   . Arthritis   . Bladder cancer   . Metastatic urothelial carcinoma 07/16/2014    has Metastatic urothelial carcinoma and Iron deficiency anemia due to chronic blood loss on his problem list.     has No Known Allergies.  Mr. Cardenas does not currently have medications on file.  Past Surgical History  Procedure Laterality Date  . Knee surgery    . Tonsillectomy    . Transurethral resection of bladder tumor with gyrus (turbt-gyrus) N/A 07/16/2014    Procedure: TRANSURETHRAL RESECTION OF BLADDER TUMOR WITH GYRUS (TURBT-GYRUS);  Surgeon: Malka So, MD;  Location: [facility];  Service: Urology;  Laterality: N/A;  . Cystoscopy with biopsy N/A 07/16/2014    Procedure: CYSTOSCOPY WITH PROSTATE ULTRASOUND AND BIOPSY;  Surgeon: Malka So, MD;  Location: [facility];  Service: Urology;  Laterality: N/A;    Denies any headaches, dizziness, double vision, fevers, chills, night sweats, nausea, vomiting, diarrhea, constipation, chest pain, heart palpitations, shortness of breath, blood in stool, black tarry stool, urinary pain, urinary burning, hematuria.   PHYSICAL EXAMINATION  ECOG PERFORMANCE STATUS: 0 - Asymptomatic  Filed Vitals:   01/23/15 0913  BP: 109/68  Pulse: 72  Temp: 98 F (36.7 C)  Resp: 14    GENERAL:alert, no distress, well nourished, well developed, comfortable, cooperative and smiling SKIN: skin color, texture, turgor are normal, no rashes or significant lesions HEAD: Normocephalic, No masses, lesions, tenderness or abnormalities EYES: normal, PERRLA, EOMI, Conjunctiva are pink and non-injected EARS: External ears  normal OROPHARYNX:lips, buccal mucosa, and tongue normal and mucous membranes are moist  NECK: supple, no adenopathy, thyroid normal size, non-tender, without nodularity, no stridor, non-tender, trachea  midline LYMPH:  no palpable lymphadenopathy, no hepatosplenomegaly BREAST:not examined LUNGS: clear to auscultation and percussion HEART: regular rate & rhythm, no murmurs, no gallops, S1 normal and S2 normal ABDOMEN:abdomen soft, non-tender, normal bowel sounds and no masses or organomegaly BACK: Back symmetric, no curvature., No CVA tenderness EXTREMITIES:less then 2 second capillary refill, no joint deformities, effusion, or inflammation, no skin discoloration, no cyanosis  NEURO: alert & oriented x 3 with fluent speech, no focal motor/sensory deficits, gait normal   LABORATORY DATA: CBC    Component Value Date/Time   WBC 5.4 01/09/2015 0910   RBC 3.72* 01/09/2015 0910   HGB 12.6* 01/09/2015 0910   HCT 37.2* 01/09/2015 0910   PLT 120* 01/09/2015 0910   MCV 100.0 01/09/2015 0910   MCH 33.9 01/09/2015 0910   MCHC 33.9 01/09/2015 0910   RDW 14.2 01/09/2015 0910   LYMPHSABS 2.1 01/09/2015 0910   MONOABS 0.6 01/09/2015 0910   EOSABS 0.1 01/09/2015 0910   BASOSABS 0.0 01/09/2015 0910      Chemistry      Component Value Date/Time   NA 138 01/09/2015 0910   K 3.9 01/09/2015 0910   CL 104 01/09/2015 0910   CO2 26 01/09/2015 0910   BUN 20 01/09/2015 0910   CREATININE 1.08 01/09/2015 0910      Component Value Date/Time   CALCIUM 8.6* 01/09/2015 0910   ALKPHOS 52 01/09/2015 0910   AST 37 01/09/2015 0910   ALT 44 01/09/2015 0910   BILITOT 0.6 01/09/2015 0910        ASSESSMENT AND PLAN:  No problem-specific assessment & plan notes found for this encounter.   THERAPY PLAN:  Continue palliative treatment as planned with the goal to complete 6 Cycles of therapy.   All questions were answered. The patient knows to call the clinic with any problems, questions or concerns. We can certainly see the patient much sooner if necessary.  Patient and plan discussed with Dr. Ancil Linsey and she is in agreement with the aforementioned.   This note is electronically signed by:  Robynn Pane 01/23/2015 9:26 AM

## 2015-01-23 NOTE — Patient Instructions (Addendum)
..  [place] at [facility] Discharge Instructions  RECOMMENDATIONS MADE BY THE CONSULTANT AND ANY TEST RESULTS WILL BE SENT TO YOUR REFERRING PHYSICIAN.  You are finished with your treatment today!  Your PET scan is scheduled for 03/02/15 and your next follow up visit is 02/25/15 here in the [place].   For PET scan nothing to eat or drink 6 hours prior to the test.  Do not eat or drink anything with sugar in it. Please call us with any further questions or comments.    Thank you for choosing [place] at [facility] to provide your oncology and hematology care.  To afford each patient quality time with our provider, please arrive at least 15 minutes before your scheduled appointment time.    You need to re-schedule your appointment should you arrive 10 or more minutes late.  We strive to give you quality time with our providers, and arriving late affects you and other patients whose appointments are after yours.  Also, if you no show three or more times for appointments you may be dismissed from the clinic at the providers discretion.     Again, thank you for choosing [facility]  Our hope is that these requests will decrease the amount of time that you wait before being seen by our physicians.       _____________________________________________________________  Should you have questions after your visit to [facility], please contact our office at [phone] between the hours of 8:30 a.m. and 4:30 p.m.  Voicemails left after 4:30 p.m. will not be returned until the following business day.  For prescription refill requests, have your pharmacy contact our office.

## 2015-01-23 NOTE — Progress Notes (Signed)
Tolerated chemo well. 

## 2015-02-20 ENCOUNTER — Ambulatory Visit (HOSPITAL_COMMUNITY)
Admission: RE | Admit: 2015-02-20 | Discharge: 2015-02-20 | Disposition: A | Discharge status: Home / Self Care | Payer: Commercial Managed Care - HMO | Source: Ambulatory Visit | Attending: Oncology | Admitting: Oncology

## 2015-02-20 DIAGNOSIS — C7910 Secondary malignant neoplasm of unspecified urinary organs: Secondary | ICD-10-CM | POA: Diagnosis not present

## 2015-02-20 LAB — GLUCOSE, CAPILLARY: GLUCOSE-CAPILLARY: 97 mg/dL (ref 65–99)

## 2015-02-20 MED ORDER — FLUDEOXYGLUCOSE F - 18 (FDG) INJECTION
8.6000 | Freq: Once | INTRAVENOUS | Status: DC | PRN
  Administered 2015-02-20: 8.6 via INTRAVENOUS
  Filled 2015-02-20: qty 8.6

## 2015-02-25 ENCOUNTER — Encounter (HOSPITAL_COMMUNITY): Payer: Self-pay | Admitting: Hematology & Oncology

## 2015-02-25 ENCOUNTER — Encounter (HOSPITAL_COMMUNITY): Payer: Commercial Managed Care - HMO | Attending: Hematology & Oncology | Admitting: Hematology & Oncology

## 2015-02-25 VITALS — BP 135/82 | HR 65 | Temp 97.5°F | Resp 20 | Wt 176.5 lb

## 2015-02-25 DIAGNOSIS — C679 Malignant neoplasm of bladder, unspecified: Secondary | ICD-10-CM

## 2015-02-25 DIAGNOSIS — C787 Secondary malignant neoplasm of liver and intrahepatic bile duct: Secondary | ICD-10-CM | POA: Diagnosis not present

## 2015-02-25 DIAGNOSIS — C7910 Secondary malignant neoplasm of unspecified urinary organs: Secondary | ICD-10-CM

## 2015-02-25 NOTE — Patient Instructions (Addendum)
[place] at [facility] Discharge Instructions  RECOMMENDATIONS MADE BY THE CONSULTANT AND ANY TEST RESULTS WILL BE SENT TO YOUR REFERRING PHYSICIAN.   Exam done and seen today by Dr.Penland Follow up in 6 weeks to see the PA Will see Dr. Whitney Muse in 31months after Pet Scan  Thank you for choosing [place] at [facility] to provide your oncology and hematology care.  To afford each patient quality time with our provider, please arrive at least 15 minutes before your scheduled appointment time.    You need to re-schedule your appointment should you arrive 10 or more minutes late.  We strive to give you quality time with our providers, and arriving late affects you and other patients whose appointments are after yours.  Also, if you no show three or more times for appointments you may be dismissed from the clinic at the providers discretion.     Again, thank you for choosing [facility].  Our hope is that these requests will decrease the amount of time that you wait before being seen by our physicians.       _____________________________________________________________  Should you have questions after your visit to [facility], please contact our office at [phone] between the hours of 8:30 a.m. and 4:30 p.m.  Voicemails left after 4:30 p.m. will not be returned until the following business day.  For prescription refill requests, have your pharmacy contact our office.

## 2015-02-25 NOTE — Progress Notes (Signed)
Jesus Cahill, MD  [place] [address]   Metastatic urothelial carcinoma   Staging form: Urinary Bladder, AJCC 7th Edition     Clinical stage from 09/19/2014: Stage IV (T4a, N3, M1) - Signed by Baird Cancer, PA-C on 09/19/2014  CURRENT THERAPY: Carboplatin/Gemzar  INTERVAL HISTORY: Jesus [name] 64 y.o. male returns for followup of stage IV urothelial carcinoma. [facility] Pt denies problems sleeping, nausea, vomiting, hematuria. Pt denies any new pain.  He has no other complaints or concerns. He has completed chemotherapy.He is present alone today.  The patient presents to discuss the result of his recent PET scan.  He denies hematuria or experiencing any new pain.  He has had no problems walking or with leg edema.  He has been eating and sleeping well.  He has no complaints or concerns at this time.       Metastatic urothelial carcinoma   07/09/2014 Imaging  CT A/P Irregular and lobular bladder wall thickening suspicious for transitional cell carcinoma. Considerable associated pelvic and retroperitoneal adenopathy. 1.4 by 1.3 cm complex and likely enhancing lesion of the right hepatic lobe dome   07/16/2014 - 07/19/2014 Hospital Admission Hospital admission for TURP. Course complicated by ARF   3/81/0175 Procedure TRANSURETHRAL RESECTION OF BLADDER TUMOR WITH GYRUS (TURBT-GYRUS) CYSTOSCOPY WITH PROSTATE ULTRASOUND AND BIOPSY.  Dr. Irine Seal   08/08/2014 PET scan Irregular bladder wall thickening, compatible with known primary bladder neoplasm. Associated mild left hydroureteronephrosis. Extensive retroperitoneal nodal metastases. Associated pelvic implant, suspicious for peritoneal disease.2.3 metastasis liver   08/23/2014 - 10/10/2014 Chemotherapy Carboplatin and Gemzar day 1 and 8 given in the palliative setting   10/10/2014 Treatment Plan Change Changing chemotherapy to every other week and cycle length to 28 day cycle   10/10/2014 -  01/23/2015 Chemotherapy Carboplatin/Gemzar day 1 and 15 every 28 days in the palliative setting x 6 cycles.   11/04/2014 PET scan Interval complete metabolic response to therapy.  Residual, borderline left periaortic and right external iliac lymph nodes exhibit low level, nonspecific FDG uptake.    Past Medical History  Diagnosis Date  . High cholesterol   . Depression     hx of   . Arthritis   . Bladder cancer   . Metastatic urothelial carcinoma 07/16/2014    has Metastatic urothelial carcinoma and Iron deficiency anemia due to chronic blood loss on his problem list.     has No Known Allergies.  Current Outpatient Prescriptions on File Prior to Visit  Medication Sig Dispense Refill  . CARBOPLATIN IV Inject into the vein. Starting 08/23/14: Day 1 every 21 days    . folic acid (V-R FOLIC ACID) 102 MCG tablet Take 1 tablet (400 mcg total) by mouth daily. 30 tablet 6  . Gemcitabine HCl (GEMZAR IV) Inject into the vein. Starting 08/23/14: Day 1 & 8 every 21 days    . lidocaine-prilocaine (EMLA) cream Apply a quarter size amount to port site 1 hour prior to chemo. Do not rub in. Cover with plastic wrap. 30 g 3  . ondansetron (ZOFRAN) 8 MG tablet Take 1 tablet (8 mg total) by mouth every 8 (eight) hours as needed for nausea or vomiting. (Patient not taking: Reported on 09/26/2014) 30 tablet 2  . prochlorperazine (COMPAZINE) 10 MG tablet Take 1 tablet (10 mg total) by mouth every 6 (six) hours as needed for nausea or vomiting. (Patient not taking: Reported on 09/26/2014)  30 tablet 2   Current Facility-Administered Medications on File Prior to Visit  Medication Dose Route Frequency Provider Last Rate Last Dose  . fludeoxyglucose F - 18 (FDG) injection 8.6 milli Curie  8.6 milli Curie Intravenous Once PRN Medication Radiologist, MD   8.6 milli Curie at 02/20/15 1106     Past Surgical History  Procedure Laterality Date  . Knee surgery    . Tonsillectomy    . Transurethral resection of bladder tumor  with gyrus (turbt-gyrus) N/A 07/16/2014    Procedure: TRANSURETHRAL RESECTION OF BLADDER TUMOR WITH GYRUS (TURBT-GYRUS);  Surgeon: Malka So, MD;  Location: [facility];  Service: Urology;  Laterality: N/A;  . Cystoscopy with biopsy N/A 07/16/2014    Procedure: CYSTOSCOPY WITH PROSTATE ULTRASOUND AND BIOPSY;  Surgeon: Malka So, MD;  Location: [facility];  Service: Urology;  Laterality: N/A;   14 point review of systems is performed and is negative except as detailed in history of present illness  14 point review of systems was performed and is negative except as detailed under history of present illness and above    PHYSICAL EXAMINATION  ECOG PERFORMANCE STATUS: 0 - Asymptomatic  Filed Vitals:   02/25/15 0910  BP: 135/82  Pulse: 65  Temp: 97.5 F (36.4 C)  Resp: 20    GENERAL:alert, no distress, well nourished, well developed, comfortable, cooperative and smiling SKIN: skin color, texture, turgor are normal, no rashes or significant lesions. Lipoma on right upper back HEAD: Normocephalic, No masses, lesions, tenderness or abnormalities EYES: normal, PERRLA, EOMI, Conjunctiva are pink and non-injected EARS: External ears normal OROPHARYNX:lips, buccal mucosa, and tongue normal and mucous membranes are moist  NECK: supple, no adenopathy, thyroid normal size, non-tender, without nodularity, no stridor, non-tender, trachea midline LYMPH:  no palpable lymphadenopathy, no hepatosplenomegaly BREAST:not examined LUNGS: clear to auscultation and percussion HEART: regular rate & rhythm, no murmurs, no gallops, S1 normal and S2 normal ABDOMEN:abdomen soft, non-tender, normal bowel sounds and no masses or organomegaly BACK: Back symmetric, no curvature., No CVA tenderness EXTREMITIES:less then 2 second capillary refill, no joint deformities, effusion, or inflammation, no skin discoloration, no cyanosis  NEURO: alert & oriented x 3 with fluent speech, no focal motor/sensory deficits, gait  normal   LABORATORY DATA: CBC    Component Value Date/Time   WBC 4.6 01/23/2015 0920   RBC 3.69* 01/23/2015 0920   HGB 12.6* 01/23/2015 0920   HCT 37.2* 01/23/2015 0920   PLT 125* 01/23/2015 0920   MCV 100.8* 01/23/2015 0920   MCH 34.1* 01/23/2015 0920   MCHC 33.9 01/23/2015 0920   RDW 13.7 01/23/2015 0920   LYMPHSABS 1.6 01/23/2015 0920   MONOABS 0.6 01/23/2015 0920   EOSABS 0.1 01/23/2015 0920   BASOSABS 0.0 01/23/2015 0920      Chemistry      Component Value Date/Time   NA 139 01/23/2015 0920   K 4.0 01/23/2015 0920   CL 106 01/23/2015 0920   CO2 27 01/23/2015 0920   BUN 21* 01/23/2015 0920   CREATININE 1.17 01/23/2015 0920      Component Value Date/Time   CALCIUM 9.2 01/23/2015 0920   ALKPHOS 60 01/23/2015 0920   AST 34 01/23/2015 0920   ALT 32 01/23/2015 0920   BILITOT 0.7 01/23/2015 0920     RADIOLOGY: I have reviewed the images below and agree with the report:  CLINICAL DATA: Subsequent treatment strategy for metastatic bladder cancer.  EXAM: NUCLEAR MEDICINE PET SKULL BASE TO THIGH IMPRESSION: 1. Interval complete  metabolic response to therapy. 2. Residual, borderline left periaortic and right external iliac lymph nodes exhibit low level, nonspecific FDG uptake.   Electronically Signed  By: Kerby Moors M.D.  On: 11/04/2014 15:14  CLINICAL DATA: Subsequent treatment strategy for bladder carcinoma.  EXAM: NUCLEAR MEDICINE PET SKULL BASE TO THIGH  TECHNIQUE: 8.6 mCi F-18 FDG was injected intravenously. Full-ring PET imaging was performed from the skull base to thigh after the radiotracer. CT data was obtained and used for attenuation correction and anatomic localization.  FASTING BLOOD GLUCOSE: Value: 97 mg/dl  COMPARISON: PET-CT 11/04/2014, 08/08/2014.  FINDINGS: NECK  No hypermetabolic lymph nodes in the neck.  CHEST  No hypermetabolic mediastinal hilar lymph nodes.  There is a 4 mm nodule in the RIGHT  middle lobe on image 76, series 4 which is not changed in size from PET-CT of 08/08/2014. Smaller RIGHT apical nodule measuring 2 mm on image 54 is also unchanged.  ABDOMEN/PELVIS  No abnormal hypermetabolic activity within the liver, pancreas, adrenal glands, or spleen. No hypermetabolic lymph nodes in the abdomen or pelvis.  High activity within the bladder limits evaluation.  SKELETON  No focal hypermetabolic activity to suggest skeletal metastasis.  IMPRESSION: 1. No evidence of local bladder cancer recurrence or distant metastatic disease. 2. Small RIGHT lung pulmonary nodules are stable.   Electronically Signed  By: Suzy Bouchard M.D.  On: 02/20/2015 15:53  ASSESSMENT AND PLAN:  Stage IV urothelial carcinoma of the bladder Carboplatin/Gemzar   PET is wnl. He will need repeat imaging in several months however given that he presented with stage IV disease. Doing remarkably well. He completed 6 cycles of chemotherapy with carboplatin and Gemzar.  We will see him back in 6 weeks for follow-up. He will return in 3 months with repeat PET imaging and labs.  The patient knows to call the clinic with any problems, questions or concerns. We can certainly see the patient much sooner if necessary.   This document serves as a record of services personally performed by Ancil Linsey, MD. It was created on her behalf by Janace Hoard, a trained medical scribe. The creation of this record is based on the scribe's personal observations and the provider's statements to them. This document has been checked and approved by the attending provider.  I have reviewed the above documentation for accuracy and completeness, and I agree with the above.  This note was electronically signed.  Kelby Fam. Penland MD

## 2015-04-08 ENCOUNTER — Encounter (HOSPITAL_COMMUNITY): Payer: Self-pay | Admitting: Oncology

## 2015-04-08 ENCOUNTER — Encounter (HOSPITAL_COMMUNITY): Payer: Commercial Managed Care - HMO | Attending: Hematology & Oncology | Admitting: Oncology

## 2015-04-08 VITALS — BP 135/85 | HR 75 | Temp 97.9°F | Resp 15 | Wt 176.6 lb

## 2015-04-08 DIAGNOSIS — C679 Malignant neoplasm of bladder, unspecified: Secondary | ICD-10-CM | POA: Diagnosis not present

## 2015-04-08 DIAGNOSIS — Z0000 Encounter for general adult medical examination without abnormal findings: Secondary | ICD-10-CM

## 2015-04-08 DIAGNOSIS — Z23 Encounter for immunization: Secondary | ICD-10-CM | POA: Diagnosis not present

## 2015-04-08 DIAGNOSIS — D500 Iron deficiency anemia secondary to blood loss (chronic): Secondary | ICD-10-CM

## 2015-04-08 DIAGNOSIS — C7910 Secondary malignant neoplasm of unspecified urinary organs: Secondary | ICD-10-CM

## 2015-04-08 DIAGNOSIS — C787 Secondary malignant neoplasm of liver and intrahepatic bile duct: Secondary | ICD-10-CM | POA: Diagnosis not present

## 2015-04-08 LAB — COMPREHENSIVE METABOLIC PANEL
ALK PHOS: 42 U/L (ref 38–126)
ALT: 22 U/L (ref 17–63)
AST: 29 U/L (ref 15–41)
Albumin: 4.6 g/dL (ref 3.5–5.0)
Anion gap: 8 (ref 5–15)
BILIRUBIN TOTAL: 0.9 mg/dL (ref 0.3–1.2)
BUN: 19 mg/dL (ref 6–20)
CALCIUM: 9.4 mg/dL (ref 8.9–10.3)
CHLORIDE: 105 mmol/L (ref 101–111)
CO2: 27 mmol/L (ref 22–32)
CREATININE: 1.71 mg/dL — AB (ref 0.61–1.24)
GFR, EST AFRICAN AMERICAN: 48 mL/min — AB (ref >60)
GFR, EST NON AFRICAN AMERICAN: 41 mL/min — AB (ref >60)
Glucose, Bld: 94 mg/dL (ref 65–99)
Potassium: 4.1 mmol/L (ref 3.5–5.1)
Sodium: 140 mmol/L (ref 135–145)
TOTAL PROTEIN: 7.3 g/dL (ref 6.5–8.1)

## 2015-04-08 LAB — CBC WITH DIFFERENTIAL/PLATELET
BASOS ABS: 0 10*3/uL (ref 0.0–0.1)
Basophils Relative: 0 %
EOS PCT: 1 %
Eosinophils Absolute: 0.1 10*3/uL (ref 0.0–0.7)
HEMATOCRIT: 45.4 % (ref 39.0–52.0)
HEMOGLOBIN: 15.2 g/dL (ref 13.0–17.0)
LYMPHS ABS: 1.8 10*3/uL (ref 0.7–4.0)
LYMPHS PCT: 32 %
MCH: 31.9 pg (ref 26.0–34.0)
MCHC: 33.5 g/dL (ref 30.0–36.0)
MCV: 95.2 fL (ref 78.0–100.0)
Monocytes Absolute: 0.4 10*3/uL (ref 0.1–1.0)
Monocytes Relative: 6 %
NEUTROS ABS: 3.5 10*3/uL (ref 1.7–7.7)
Neutrophils Relative %: 61 %
PLATELETS: 147 10*3/uL — AB (ref 150–400)
RBC: 4.77 MIL/uL (ref 4.22–5.81)
RDW: 12.5 % (ref 11.5–15.5)
WBC: 5.7 10*3/uL (ref 4.0–10.5)

## 2015-04-08 LAB — FERRITIN: FERRITIN: 60 ng/mL (ref 24–336)

## 2015-04-08 MED ORDER — INFLUENZA VAC SPLIT QUAD 0.5 ML IM SUSY
0.5000 mL | PREFILLED_SYRINGE | Freq: Once | INTRAMUSCULAR | Status: AC
  Administered 2015-04-08: 0.5 mL via INTRAMUSCULAR
  Filled 2015-04-08: qty 0.5

## 2015-04-08 MED ORDER — SODIUM CHLORIDE 0.9 % IJ SOLN
10.0000 mL | INTRAMUSCULAR | Status: DC | PRN
  Administered 2015-04-08: 10 mL via INTRAVENOUS
  Filled 2015-04-08: qty 10

## 2015-04-08 MED ORDER — HEPARIN SOD (PORK) LOCK FLUSH 100 UNIT/ML IV SOLN
500.0000 [IU] | Freq: Once | INTRAVENOUS | Status: AC
  Administered 2015-04-08: 500 [IU] via INTRAVENOUS
  Filled 2015-04-08: qty 5

## 2015-04-08 NOTE — Progress Notes (Signed)
Jesus [name] presents today for injection per MD orders. Flu vaccine administered IM in left deltoid. Administration without incident. Patient tolerated well. Jesus [name] presented for [facility]. Labs per MD order drawn via Portacath located in the right chest wall accessed with  H 20 needle. Good blood return present. Procedure without incident.  Needle removed intact. Patient tolerated procedure well.

## 2015-04-08 NOTE — Patient Instructions (Signed)
[place] at [facility] Discharge Instructions  RECOMMENDATIONS MADE BY THE CONSULTANT AND ANY TEST RESULTS WILL BE SENT TO YOUR REFERRING PHYSICIAN.  Exam and discussion by Robynn Pane, PA-C Flu vaccine today Port flush today PET scan as scheduled Call with any concerns  Follow-up after PET scan  Thank you for choosing [place] [facility] to provide your oncology and hematology care.  To afford each patient quality time with our provider, please arrive at least 15 minutes before your scheduled appointment time.    You need to re-schedule your appointment should you arrive 10 or more minutes late.  We strive to give you quality time with our providers, and arriving late affects you and other patients whose appointments are after yours.  Also, if you no show three or more times for appointments you may be dismissed from the clinic at the providers discretion.     Again, thank you for choosing [facility].  Our hope is that these requests will decrease the amount of time that you wait before being seen by our physicians.       _____________________________________________________________  Should you have questions after your visit to [facility], please contact our office at [phone] between the hours of 8:30 a.m. and 4:30 p.m.  Voicemails left after 4:30 p.m. will not be returned until the following business day.  For prescription refill requests, have your pharmacy contact our office.

## 2015-04-08 NOTE — Progress Notes (Signed)
Wende Neighbors, MD [place] [address]  Preventative health care - Plan: Influenza vac split quadrivalent PF (FLUARIX) injection 0.5 mL, Schedule Portacath Flush Appointment, heparin lock flush 100 unit/mL, sodium chloride 0.9 % injection 10 mL  Metastatic urothelial carcinoma (HCC) - Plan: CBC with Differential, Comprehensive metabolic panel, Schedule Portacath Flush Appointment, heparin lock flush 100 unit/mL, sodium chloride 0.9 % injection 10 mL, CBC with Differential, Comprehensive metabolic panel  Iron deficiency anemia due to chronic blood loss - Plan: CBC with Differential, Ferritin, Schedule Portacath Flush Appointment, heparin lock flush 100 unit/mL, sodium chloride 0.9 % injection 10 mL, CBC with Differential, Ferritin  CURRENT THERAPY: Surveillance.  INTERVAL HISTORY: Jesus [name] 64 y.o. male returns for followup of stage IV urothelial carcinoma, currently in CR.    Metastatic urothelial carcinoma ([place])   07/09/2014 Imaging  CT A/P Irregular and lobular bladder wall thickening suspicious for transitional cell carcinoma. Considerable associated pelvic and retroperitoneal adenopathy. 1.4 by 1.3 cm complex and likely enhancing lesion of the right hepatic lobe dome   07/16/2014 - 07/19/2014 Hospital Admission Hospital admission for TURP. Course complicated by ARF   6/94/5038 Procedure TRANSURETHRAL RESECTION OF BLADDER TUMOR WITH GYRUS (TURBT-GYRUS) CYSTOSCOPY WITH PROSTATE ULTRASOUND AND BIOPSY.  Dr. Irine Seal   08/08/2014 PET scan Irregular bladder wall thickening, compatible with known primary bladder neoplasm. Associated mild left hydroureteronephrosis. Extensive retroperitoneal nodal metastases. Associated pelvic implant, suspicious for peritoneal disease.2.3 metastasis liver   08/23/2014 - 10/10/2014 Chemotherapy Carboplatin and Gemzar day 1 and 8 given in the palliative setting   10/10/2014 Treatment Plan Change Changing chemotherapy to every other week  and cycle length to 28 day cycle   10/10/2014 - 01/23/2015 Chemotherapy Carboplatin/Gemzar day 1 and 15 every 28 days in the palliative setting x 6 cycles.   11/04/2014 PET scan Interval complete metabolic response to therapy.  Residual, borderline left periaortic and right external iliac lymph nodes exhibit low level, nonspecific FDG uptake.   02/20/2015 Remission PET- No evidence of local bladder cancer recurrence or distant metastatic disease. Small RIGHT lung pulmonary nodules are stable.     I personally reviewed and went over laboratory results with the patient.  The results are noted within this dictation. Labs are updated today.  "I do not want to have scans often.  Maybe like every 6 months or once per year."  I provided him education regarding his Stage IV disease and high risk of recurrence.  Waiting that long between scans, particularly after just completing therapy, is concerning for not finding an early recurrence.  "Well my cancer was found with blood in my urine."  However, if he recurs elsewhere, we will not see blood in urine.  Thus, imaging frequently is indicated.    He provides me a packet of information from department of motor vehicles that is to be filled out by a medical provider.  I have reviewed the packet and it would be best to filled out by primary care provider as it has nothing to do with his cancer diagnosis.  Additionally, it asked a number of primary care questions that I cannot answer as I do not have the answers.   Past Medical History  Diagnosis Date  . High cholesterol   . Depression     hx of   . Arthritis   . Bladder cancer ([place])   . Metastatic urothelial carcinoma ([place]) 07/16/2014    has Metastatic urothelial carcinoma (HCC) and Iron  deficiency anemia due to chronic blood loss on his problem list.     has No Known Allergies.  Current Outpatient Prescriptions on File Prior to Visit  Medication Sig Dispense Refill  . folic acid (V-R FOLIC ACID) 811 MCG  tablet Take 1 tablet (400 mcg total) by mouth daily. 30 tablet 6  . lidocaine-prilocaine (EMLA) cream Apply a quarter size amount to port site 1 hour prior to chemo. Do not rub in. Cover with plastic wrap. 30 g 3  . ondansetron (ZOFRAN) 8 MG tablet Take 1 tablet (8 mg total) by mouth every 8 (eight) hours as needed for nausea or vomiting. (Patient not taking: Reported on 09/26/2014) 30 tablet 2  . prochlorperazine (COMPAZINE) 10 MG tablet Take 1 tablet (10 mg total) by mouth every 6 (six) hours as needed for nausea or vomiting. (Patient not taking: Reported on 09/26/2014) 30 tablet 2   No current facility-administered medications on file prior to visit.    Past Surgical History  Procedure Laterality Date  . Knee surgery    . Tonsillectomy    . Transurethral resection of bladder tumor with gyrus (turbt-gyrus) N/A 07/16/2014    Procedure: TRANSURETHRAL RESECTION OF BLADDER TUMOR WITH GYRUS (TURBT-GYRUS);  Surgeon: Malka So, MD;  Location: [facility];  Service: Urology;  Laterality: N/A;  . Cystoscopy with biopsy N/A 07/16/2014    Procedure: CYSTOSCOPY WITH PROSTATE ULTRASOUND AND BIOPSY;  Surgeon: Malka So, MD;  Location: [facility];  Service: Urology;  Laterality: N/A;    Denies any headaches, dizziness, double vision, fevers, chills, night sweats, nausea, vomiting, diarrhea, constipation, chest pain, heart palpitations, shortness of breath, blood in stool, black tarry stool, urinary pain, urinary burning, urinary frequency, hematuria.   PHYSICAL EXAMINATION  ECOG PERFORMANCE STATUS: 1 - Symptomatic but completely ambulatory  Filed Vitals:   04/08/15 1334  BP: 135/85  Pulse: 75  Temp: 97.9 F (36.6 C)  Resp: 15    GENERAL:alert, no distress, well nourished, well developed, comfortable, cooperative, smiling and unaccompanied SKIN: skin color, texture, turgor are normal, no rashes or significant lesions HEAD: Normocephalic, No masses, lesions, tenderness or abnormalities EYES: normal,  PERRLA, EOMI, Conjunctiva are pink and non-injected EARS: External ears normal OROPHARYNX:lips, buccal mucosa, and tongue normal and mucous membranes are moist  NECK: supple, no adenopathy, thyroid normal size, non-tender, without nodularity, no stridor, non-tender, trachea midline LYMPH:  not examined BREAST:not examined LUNGS: clear to auscultation  HEART: regular rate & rhythm, no murmurs and no gallops ABDOMEN:abdomen soft, non-tender and normal bowel sounds BACK: Back symmetric, no curvature., No CVA tenderness EXTREMITIES:less then 2 second capillary refill, no joint deformities, effusion, or inflammation, no skin discoloration, no clubbing, no cyanosis  NEURO: alert & oriented x 3 with fluent speech, no focal motor/sensory deficits, gait normal   LABORATORY DATA: CBC    Component Value Date/Time   WBC 5.7 04/08/2015 1350   RBC 4.77 04/08/2015 1350   HGB 15.2 04/08/2015 1350   HCT 45.4 04/08/2015 1350   PLT 147* 04/08/2015 1350   MCV 95.2 04/08/2015 1350   MCH 31.9 04/08/2015 1350   MCHC 33.5 04/08/2015 1350   RDW 12.5 04/08/2015 1350   LYMPHSABS 1.8 04/08/2015 1350   MONOABS 0.4 04/08/2015 1350   EOSABS 0.1 04/08/2015 1350   BASOSABS 0.0 04/08/2015 1350      Chemistry      Component Value Date/Time   NA 140 04/08/2015 1350   K 4.1 04/08/2015 1350   CL 105 04/08/2015 1350  CO2 27 04/08/2015 1350   BUN 19 04/08/2015 1350   CREATININE 1.71* 04/08/2015 1350      Component Value Date/Time   CALCIUM 9.4 04/08/2015 1350   ALKPHOS 42 04/08/2015 1350   AST 29 04/08/2015 1350   ALT 22 04/08/2015 1350   BILITOT 0.9 04/08/2015 1350        PENDING LABS:   RADIOGRAPHIC STUDIES:  No results found.   PATHOLOGY:    ASSESSMENT AND PLAN:  Metastatic urothelial carcinoma Stage IV urothelial carcinoma, S/P 6 cycles of carboplatin/gemcitabine with CR noted following 3 cycles of chemotherapy.  CR continued on PET imaging on 02/20/2015.  Oncology history  updated.  PET imaging is scheduled for 12/5.  Labs today: CBC diff, CMET, ferritin.  Labs in 6 weeks: CBC diff, CMET.  Return in 6 weeks following PET imaging for follow-up.  I have referred him to his primary care provider, Dr. Nevada Crane, for completion of paper work for his ability to drive a motor vehicle.  From a cancer standpoint, he is able, but the paper work was asking about non-cancer items.  We had a long discussion regarding the role of imaging studies.  Unfortunately, due to his type of malignancy, PET is recommended.  I have asked him to discuss the frequency with Dr. Whitney Muse on his next follow-up appointment.  Influenza vaccine given today.  He requests to wait for his lab results.  He will wait in the waiting room.    THERAPY PLAN:  Continue with surveillance.  All questions were answered. The patient knows to call the clinic with any problems, questions or concerns. We can certainly see the patient much sooner if necessary.  Patient and plan discussed with Dr. Ancil Linsey and she is in agreement with the aforementioned.   This note is electronically signed by: Doy Mince 04/08/2015 5:03 PM

## 2015-04-08 NOTE — Assessment & Plan Note (Addendum)
Stage IV urothelial carcinoma, S/P 6 cycles of carboplatin/gemcitabine with CR noted following 3 cycles of chemotherapy.  CR continued on PET imaging on 02/20/2015.  Oncology history updated.  PET imaging is scheduled for 12/5.  Labs today: CBC diff, CMET, ferritin.  Labs in 6 weeks: CBC diff, CMET.  Return in 6 weeks following PET imaging for follow-up.  I have referred him to his primary care provider, Dr. Nevada Crane, for completion of paper work for his ability to drive a motor vehicle.  From a cancer standpoint, he is able, but the paper work was asking about non-cancer items.  We had a long discussion regarding the role of imaging studies.  Unfortunately, due to his type of malignancy, PET is recommended.  I have asked him to discuss the frequency with Dr. Whitney Muse on his next follow-up appointment.  Influenza vaccine given today.  He requests to wait for his lab results.  He will wait in the waiting room.

## 2015-04-09 ENCOUNTER — Other Ambulatory Visit (HOSPITAL_COMMUNITY): Payer: Self-pay | Admitting: Oncology

## 2015-04-09 DIAGNOSIS — D500 Iron deficiency anemia secondary to blood loss (chronic): Secondary | ICD-10-CM

## 2015-04-09 MED ORDER — POLYSACCHARIDE IRON COMPLEX 150 MG PO CAPS: 150.0000 mg | ORAL_CAPSULE | Freq: Every day | ORAL | Status: DC

## 2015-04-11 ENCOUNTER — Encounter (HOSPITAL_COMMUNITY): Payer: Self-pay

## 2015-05-09 ENCOUNTER — Ambulatory Visit (INDEPENDENT_AMBULATORY_CARE_PROVIDER_SITE_OTHER): Payer: Commercial Managed Care - HMO | Admitting: Urology

## 2015-05-09 DIAGNOSIS — R310 Gross hematuria: Secondary | ICD-10-CM | POA: Diagnosis not present

## 2015-05-09 DIAGNOSIS — C679 Malignant neoplasm of bladder, unspecified: Secondary | ICD-10-CM

## 2015-05-14 ENCOUNTER — Other Ambulatory Visit (HOSPITAL_COMMUNITY): Payer: Self-pay

## 2015-05-19 ENCOUNTER — Other Ambulatory Visit (HOSPITAL_COMMUNITY): Payer: Commercial Managed Care - HMO

## 2015-05-26 ENCOUNTER — Ambulatory Visit (HOSPITAL_COMMUNITY): Payer: Commercial Managed Care - HMO

## 2015-05-27 ENCOUNTER — Encounter (HOSPITAL_COMMUNITY): Payer: Commercial Managed Care - HMO

## 2015-05-27 ENCOUNTER — Encounter (HOSPITAL_COMMUNITY): Payer: Commercial Managed Care - HMO | Admitting: Hematology & Oncology

## 2015-05-28 ENCOUNTER — Ambulatory Visit (INDEPENDENT_AMBULATORY_CARE_PROVIDER_SITE_OTHER): Payer: Commercial Managed Care - HMO | Admitting: Urology

## 2015-05-28 DIAGNOSIS — N3289 Other specified disorders of bladder: Secondary | ICD-10-CM | POA: Diagnosis not present

## 2015-05-28 DIAGNOSIS — R310 Gross hematuria: Secondary | ICD-10-CM | POA: Diagnosis not present

## 2015-06-02 ENCOUNTER — Encounter (HOSPITAL_COMMUNITY): Payer: Commercial Managed Care - HMO | Attending: Hematology & Oncology

## 2015-06-02 DIAGNOSIS — C679 Malignant neoplasm of bladder, unspecified: Secondary | ICD-10-CM | POA: Diagnosis not present

## 2015-06-02 DIAGNOSIS — C787 Secondary malignant neoplasm of liver and intrahepatic bile duct: Secondary | ICD-10-CM | POA: Diagnosis not present

## 2015-06-02 DIAGNOSIS — C7910 Secondary malignant neoplasm of unspecified urinary organs: Secondary | ICD-10-CM

## 2015-06-02 DIAGNOSIS — Z452 Encounter for adjustment and management of vascular access device: Secondary | ICD-10-CM | POA: Diagnosis not present

## 2015-06-02 LAB — CBC WITH DIFFERENTIAL/PLATELET
Basophils Absolute: 0 10*3/uL (ref 0.0–0.1)
Basophils Relative: 0 %
Eosinophils Absolute: 0.1 10*3/uL (ref 0.0–0.7)
Eosinophils Relative: 2 %
HEMATOCRIT: 44.6 % (ref 39.0–52.0)
HEMOGLOBIN: 14.7 g/dL (ref 13.0–17.0)
LYMPHS ABS: 2.5 10*3/uL (ref 0.7–4.0)
LYMPHS PCT: 35 %
MCH: 29.8 pg (ref 26.0–34.0)
MCHC: 33 g/dL (ref 30.0–36.0)
MCV: 90.3 fL (ref 78.0–100.0)
MONOS PCT: 7 %
Monocytes Absolute: 0.5 10*3/uL (ref 0.1–1.0)
NEUTROS ABS: 4 10*3/uL (ref 1.7–7.7)
NEUTROS PCT: 56 %
Platelets: 144 10*3/uL — ABNORMAL LOW (ref 150–400)
RBC: 4.94 MIL/uL (ref 4.22–5.81)
RDW: 12.9 % (ref 11.5–15.5)
WBC: 7.1 10*3/uL (ref 4.0–10.5)

## 2015-06-02 LAB — COMPREHENSIVE METABOLIC PANEL
ALBUMIN: 4.2 g/dL (ref 3.5–5.0)
ALK PHOS: 54 U/L (ref 38–126)
ALT: 16 U/L — ABNORMAL LOW (ref 17–63)
ANION GAP: 8 (ref 5–15)
AST: 25 U/L (ref 15–41)
BILIRUBIN TOTAL: 0.4 mg/dL (ref 0.3–1.2)
BUN: 22 mg/dL — ABNORMAL HIGH (ref 6–20)
CALCIUM: 9.2 mg/dL (ref 8.9–10.3)
CO2: 27 mmol/L (ref 22–32)
Chloride: 104 mmol/L (ref 101–111)
Creatinine, Ser: 1.42 mg/dL — ABNORMAL HIGH (ref 0.61–1.24)
GFR calc non Af Amer: 51 mL/min — ABNORMAL LOW (ref >60)
GFR, EST AFRICAN AMERICAN: 60 mL/min — AB (ref >60)
Glucose, Bld: 91 mg/dL (ref 65–99)
POTASSIUM: 4.1 mmol/L (ref 3.5–5.1)
SODIUM: 139 mmol/L (ref 135–145)
TOTAL PROTEIN: 6.9 g/dL (ref 6.5–8.1)

## 2015-06-02 MED ORDER — SODIUM CHLORIDE 0.9 % IJ SOLN
10.0000 mL | INTRAMUSCULAR | Status: DC | PRN
  Administered 2015-06-02: 10 mL via INTRAVENOUS
  Filled 2015-06-02: qty 10

## 2015-06-02 MED ORDER — HEPARIN SOD (PORK) LOCK FLUSH 100 UNIT/ML IV SOLN
INTRAVENOUS | Status: AC
  Filled 2015-06-02: qty 5

## 2015-06-02 MED ORDER — HEPARIN SOD (PORK) LOCK FLUSH 100 UNIT/ML IV SOLN
500.0000 [IU] | Freq: Once | INTRAVENOUS | Status: AC
  Administered 2015-06-02: 500 [IU] via INTRAVENOUS

## 2015-06-02 NOTE — Progress Notes (Signed)
Jesus [name] presented for Portacath access and flush. Proper placement of portacath confirmed by CXR. Portacath located right chest wall accessed with  H 20 needle. Good blood return present. Portacath flushed with 20ml NS and 500U/67ml Heparin and needle removed intact. Procedure without incident. Patient tolerated procedure well.

## 2015-06-02 NOTE — Patient Instructions (Signed)
[facility] at [facility] Discharge Instructions  RECOMMENDATIONS MADE BY THE CONSULTANT AND ANY TEST RESULTS WILL BE SENT TO YOUR REFERRING PHYSICIAN.  Port flush today as ordered. Return as scheduled.  Thank you for choosing [facility] at [facility] to provide your oncology and hematology care.  To afford each patient quality time with our provider, please arrive at least 15 minutes before your scheduled appointment time.    You need to re-schedule your appointment should you arrive 10 or more minutes late.  We strive to give you quality time with our providers, and arriving late affects you and other patients whose appointments are after yours.  Also, if you no show three or more times for appointments you may be dismissed from the clinic at the providers discretion.     Again, thank you for choosing [facility].  Our hope is that these requests will decrease the amount of time that you wait before being seen by our physicians.       _____________________________________________________________  Should you have questions after your visit to [facility], please contact our office at [phone] between the hours of 8:30 a.m. and 4:30 p.m.  Voicemails left after 4:30 p.m. will not be returned until the following business day.  For prescription refill requests, have your pharmacy contact our office.    

## 2015-06-03 ENCOUNTER — Other Ambulatory Visit: Payer: Self-pay | Admitting: Urology

## 2015-06-06 ENCOUNTER — Other Ambulatory Visit (HOSPITAL_COMMUNITY): Payer: Self-pay | Admitting: Hematology & Oncology

## 2015-06-06 ENCOUNTER — Encounter (HOSPITAL_COMMUNITY)
Admission: RE | Admit: 2015-06-06 | Discharge: 2015-06-06 | Disposition: A | Discharge status: Home / Self Care | Payer: Commercial Managed Care - HMO | Source: Ambulatory Visit | Attending: Hematology & Oncology | Admitting: Hematology & Oncology

## 2015-06-06 DIAGNOSIS — C7910 Secondary malignant neoplasm of unspecified urinary organs: Secondary | ICD-10-CM | POA: Insufficient documentation

## 2015-06-06 LAB — GLUCOSE, CAPILLARY: GLUCOSE-CAPILLARY: 125 mg/dL — AB (ref 65–99)

## 2015-06-06 MED ORDER — FLUDEOXYGLUCOSE F - 18 (FDG) INJECTION
8.8600 | Freq: Once | INTRAVENOUS | Status: AC | PRN
  Administered 2015-06-06: 8.86 via INTRAVENOUS

## 2015-06-11 ENCOUNTER — Encounter (HOSPITAL_COMMUNITY): Payer: Self-pay | Admitting: *Deleted

## 2015-06-11 ENCOUNTER — Telehealth (HOSPITAL_COMMUNITY): Payer: Self-pay | Admitting: Hematology & Oncology

## 2015-06-11 ENCOUNTER — Encounter (HOSPITAL_COMMUNITY): Payer: Self-pay | Admitting: Hematology & Oncology

## 2015-06-11 ENCOUNTER — Encounter (HOSPITAL_BASED_OUTPATIENT_CLINIC_OR_DEPARTMENT_OTHER): Payer: Commercial Managed Care - HMO | Admitting: Hematology & Oncology

## 2015-06-11 VITALS — BP 132/88 | HR 71 | Temp 98.4°F | Resp 18 | Wt 172.7 lb

## 2015-06-11 DIAGNOSIS — D500 Iron deficiency anemia secondary to blood loss (chronic): Secondary | ICD-10-CM

## 2015-06-11 DIAGNOSIS — C7910 Secondary malignant neoplasm of unspecified urinary organs: Secondary | ICD-10-CM

## 2015-06-11 DIAGNOSIS — C679 Malignant neoplasm of bladder, unspecified: Secondary | ICD-10-CM | POA: Diagnosis not present

## 2015-06-11 NOTE — Telephone Encounter (Signed)
Tried to submit pre auth request for chemo but per [facility] from [facility] it must be done after 06/22/15!

## 2015-06-11 NOTE — Progress Notes (Signed)
Jesus Neighbors, MD  [place] [address]   Metastatic urothelial carcinoma   Staging form: Urinary Bladder, AJCC 7th Edition     Clinical stage from 09/19/2014: Stage IV (T4a, N3, M1) - Signed by Baird Cancer, PA-C on 09/19/2014  CURRENT THERAPY: Observation  INTERVAL HISTORY: Jesus [name] 64 y.o. male returns for followup of stage IV urothelial carcinoma.  Jesus [name] returns to the [place] today with his son.  Jesus [name] notes that his bladder leaks, but he feels "that's more from my age." He says  "it might be a little worse, but not much." He denies any problems urinating. He says that sometimes "it makes him feel like he has to go, but he doesn't really have to go."  In terms of blood in his urine, he says if he goes to the bathroom 4 times, 3 will be fine, and 1 will have blood in it.  He describes the blood as a pink tinge, and that once in a while it might be a little more blood. He began noticing blood in his urine just before October 18th.   He had a scope into his bladder with Dr. Jeffie Pollock last week that told him the tumor was back. His son says they have Jesus [name] tentatively scheduled for surgery with Dr. Jeffie Pollock next week, on the 29th. Jesus [name] states that, with regards to treating his recurrent cancer, he's ready to start tomorrow if needed. He is here today to review his recent PET/CT.  Mr. Storz states that his appetite has been good and that he's still been pretty active. He denies any new pain. He confirms that the major issues are his urinary issues, as mentioned above.  He has had a flu shot this year.     Metastatic urothelial carcinoma ([place])   07/09/2014 Imaging  CT A/P Irregular and lobular bladder wall thickening suspicious for transitional cell carcinoma. Considerable associated pelvic and retroperitoneal adenopathy. 1.4 by 1.3 cm complex and likely enhancing lesion of the right hepatic lobe dome   07/16/2014 -  07/19/2014 Hospital Admission Hospital admission for TURP. Course complicated by ARF   [id number] Procedure TRANSURETHRAL RESECTION OF BLADDER TUMOR WITH GYRUS (TURBT-GYRUS) CYSTOSCOPY WITH PROSTATE ULTRASOUND AND BIOPSY.  Dr. Irine Seal   08/08/2014 PET scan Irregular bladder wall thickening, compatible with known primary bladder neoplasm. Associated mild left hydroureteronephrosis. Extensive retroperitoneal nodal metastases. Associated pelvic implant, suspicious for peritoneal disease.2.3 metastasis liver   08/23/2014 - 10/10/2014 Chemotherapy Carboplatin and Gemzar day 1 and 8 given in the palliative setting   10/10/2014 Treatment Plan Change Changing chemotherapy to every other week and cycle length to 28 day cycle   10/10/2014 - 01/23/2015 Chemotherapy Carboplatin/Gemzar day 1 and 15 every 28 days in the palliative setting x 6 cycles.   11/04/2014 PET scan Interval complete metabolic response to therapy.  Residual, borderline left periaortic and right external iliac lymph nodes exhibit low level, nonspecific FDG uptake.   02/20/2015 Remission PET- No evidence of local bladder cancer recurrence or distant metastatic disease. Small RIGHT lung pulmonary nodules are stable.    Past Medical History  Diagnosis Date  . High cholesterol   . Depression     hx of   . Arthritis   . Bladder cancer ([place])   . Metastatic urothelial carcinoma ([place]) 07/16/2014    has Metastatic urothelial carcinoma ([facility]) and Iron deficiency anemia due to chronic blood loss on his problem  list.     has No Known Allergies.  Current Outpatient Prescriptions on File Prior to Visit  Medication Sig Dispense Refill  . folic acid (V-R FOLIC ACID) [id number] MCG tablet Take 1 tablet (400 mcg total) by mouth daily. 30 tablet 6  . lidocaine-prilocaine (EMLA) cream Apply a quarter size amount to port site 1 hour prior to chemo. Do not rub in. Cover with plastic wrap. 30 g 3  . iron polysaccharides (NIFEREX) 150 MG capsule Take 1 capsule (150 mg  total) by mouth daily. (Patient not taking: Reported on 06/11/2015) 30 capsule 3   No current facility-administered medications on file prior to visit.     Past Surgical History  Procedure Laterality Date  . Knee surgery    . Tonsillectomy    . Transurethral resection of bladder tumor with gyrus (turbt-gyrus) N/A 07/16/2014    Procedure: TRANSURETHRAL RESECTION OF BLADDER TUMOR WITH GYRUS (TURBT-GYRUS);  Surgeon: Malka So, MD;  Location: [facility];  Service: Urology;  Laterality: N/A;  . Cystoscopy with biopsy N/A 07/16/2014    Procedure: CYSTOSCOPY WITH PROSTATE ULTRASOUND AND BIOPSY;  Surgeon: Malka So, MD;  Location: [facility];  Service: Urology;  Laterality: N/A;     14 point review of systems was performed and is negative except as detailed under history of present illness and above   PHYSICAL EXAMINATION  ECOG PERFORMANCE STATUS: 0 - Asymptomatic  Filed Vitals:   06/11/15 0832  BP: 132/88  Pulse: 71  Temp: 98.4 F (36.9 C)  Resp: 18    GENERAL:alert, no distress, well nourished, well developed, comfortable, cooperative and smiling SKIN: skin color, texture, turgor are normal, no rashes or significant lesions. Lipoma on right upper back HEAD: Normocephalic, No masses, lesions, tenderness or abnormalities EYES: normal, PERRLA, EOMI, Conjunctiva are pink and non-injected EARS: External ears normal OROPHARYNX:lips, buccal mucosa, and tongue normal and mucous membranes are moist  NECK: supple, no adenopathy, thyroid normal size, non-tender, without nodularity, no stridor, non-tender, trachea midline LYMPH:  no palpable lymphadenopathy, no hepatosplenomegaly BREAST:not examined LUNGS: clear to auscultation and percussion HEART: regular rate & rhythm, no murmurs, no gallops, S1 normal and S2 normal ABDOMEN:abdomen soft, non-tender, normal bowel sounds and no masses or organomegaly BACK: Back symmetric, no curvature., No CVA tenderness EXTREMITIES:less then 2 second  capillary refill, no joint deformities, effusion, or inflammation, no skin discoloration, no cyanosis  NEURO: alert & oriented x 3 with fluent speech, no focal motor/sensory deficits, gait normal   LABORATORY DATA: I have reviewed the data as listed.  CBC    Component Value Date/Time   WBC 7.1 06/02/2015 1500   RBC 4.94 06/02/2015 1500   HGB 14.7 06/02/2015 1500   HCT 44.6 06/02/2015 1500   PLT 144* 06/02/2015 1500   MCV 90.3 06/02/2015 1500   MCH 29.8 06/02/2015 1500   MCHC 33.0 06/02/2015 1500   RDW 12.9 06/02/2015 1500   LYMPHSABS 2.5 06/02/2015 1500   MONOABS 0.5 06/02/2015 1500   EOSABS 0.1 06/02/2015 1500   BASOSABS 0.0 06/02/2015 1500      Chemistry      Component Value Date/Time   NA 139 06/02/2015 1500   K 4.1 06/02/2015 1500   CL 104 06/02/2015 1500   CO2 27 06/02/2015 1500   BUN 22* 06/02/2015 1500   CREATININE 1.42* 06/02/2015 1500      Component Value Date/Time   CALCIUM 9.2 06/02/2015 1500   ALKPHOS 54 06/02/2015 1500   AST 25 06/02/2015 1500  ALT 16* 06/02/2015 1500   BILITOT 0.4 06/02/2015 1500     RADIOLOGY: I have reviewed the images below and agree with the report:  Study Result     CLINICAL DATA: Subsequent treatment strategy for stage IV bladder carcinoma. Restaging examination.  EXAM: NUCLEAR MEDICINE PET SKULL BASE TO THIGH  TECHNIQUE: 8.9 mCi F-18 FDG was injected intravenously. Full-ring PET imaging was performed from the skull base to thigh after the radiotracer. CT data was obtained and used for attenuation correction and anatomic localization.  FASTING BLOOD GLUCOSE: Value: 125 mg/dl  COMPARISON: PET-CT 02/20/2015.  FINDINGS: NECK  No hypermetabolic lymph nodes in the neck.  CHEST  No hypermetabolic mediastinal or hilar nodes. No suspicious pulmonary nodules on the CT scan. Heart size is normal. No consolidative airspace disease. No pleural effusions. Right internal jugular single-lumen porta cath with  tip terminating at the superior cavoatrial junction.  ABDOMEN/PELVIS  Compared a prior examinations there is new mild left hydroureteronephrosis which extends all the way to the level of the left ureterovesicular junction. This is apparently related to obstruction from soft tissue at the level of the urinary bladder. Specifically, the urinary bladder wall is now markedly thickened and irregular, most evident along the left lateral surface, where the largest mass-like area measures up to 5.8 x 3.5 cm (image 188 of series 4), with direct infiltration into the adjacent perivesical fat. This part of the urinary bladder wall is hypermetabolic (SUVmax = [id number]). There is extensive adjacent nodularity throughout the perivesicle space, presumably multiple metastatic lymph nodes, all of which are hypermetabolic. Extensive adenopathy throughout the left hemipelvis, with the largest lymph nodes measuring up to 12 mm in short axis in the left external iliac nodal chain (SUVmax = 11.9). Left internal iliac adenopathy also noted measuring up to 14 mm in short axis all (image 165 of series 4), which is also hypermetabolic (SUVmax = 123XX123). Numerous other enlarged and hypermetabolic retroperitoneal lymph nodes are noted, measuring up to 14 mm in short axis in the left para-aortic nodal station in the immediate infrahilar region (SUVmax = 16.3). No abnormal hypermetabolic activity within the liver, pancreas, adrenal glands, or spleen. Atherosclerosis throughout the abdominal and pelvic vasculature, without definite aneurysm. No significant volume of ascites. No pneumoperitoneum.  SKELETON  No focal hypermetabolic activity to suggest skeletal metastasis.  IMPRESSION: 1. Today's study demonstrates definitive evidence of recurrence of disease, with a large left-sided bladder wall mass, infiltrated into the surrounding soft tissues, with extensive surrounding lymphadenopathy throughout the soft  tissues of the pelvic floor, along the left pelvic sidewall and throughout the retroperitoneum on the left side extending to the level of the left renal hilum. At this time, this is associated with some mild left-sided hydroureteronephrosis from obstruction at the level of the left ureterovesicular junction. 2. No evidence of metastatic disease in the neck or thorax. 3. Additional incidental findings, as above. These results will be called to the ordering clinician or representative by the Radiologist Assistant, and communication documented in the PACS or zVision Dashboard.   Electronically Signed  By: Vinnie Langton M.D.  On: 06/06/2015 12:42     ASSESSMENT AND PLAN:  Stage IV urothelial carcinoma of the bladder Carboplatin/Gemzar with CR Recurrent disease on PET/CT 05/2015 PS 0  The patient is to proceed with his surgery with Dr. Jeffie Pollock.  I reviewed his PET in detail with he and his son.  He will need additional systemic therapy.  We discussed Tecentriq. I addressed the unique side effects/toxicities of  immunotherapies.  He is interested in proceeding.  We will arrange for teaching with [facility]. In regards to starting therapy we will plan on the week after his surgery.   The patient knows to call the clinic with any problems, questions or concerns. We can certainly see the patient much sooner if necessary.   This document serves as a record of services personally performed by Ancil Linsey, MD. It was created on her behalf by Toni Amend, a trained medical scribe. The creation of this record is based on the scribe's personal observations and the provider's statements to them. This document has been checked and approved by the attending provider.  I have reviewed the above documentation for accuracy and completeness, and I agree with the above.  This note was electronically signed.  Kelby Fam. Penland MD

## 2015-06-11 NOTE — Patient Instructions (Addendum)
[place] at [facility] Discharge Instructions  RECOMMENDATIONS MADE BY THE CONSULTANT AND ANY TEST RESULTS WILL BE SENT TO YOUR REFERRING PHYSICIAN.   Exam completed by Dr Whitney Muse today PET results reviewed Stent on 12/29 with Dr Sherrye Payor will be in contact with you to do your teaching on [facility] this drug on 06/24/2015 Return to see the doctor on 06/24/2015 Please call the clinic if you have any questions or concerns    Thank you for choosing [place] at [facility] to provide your oncology and hematology care.  To afford each patient quality time with our provider, please arrive at least 15 minutes before your scheduled appointment time.    You need to re-schedule your appointment should you arrive 10 or more minutes late.  We strive to give you quality time with our providers, and arriving late affects you and other patients whose appointments are after yours.  Also, if you no show three or more times for appointments you may be dismissed from the clinic at the providers discretion.     Again, thank you for choosing [facility].  Our hope is that these requests will decrease the amount of time that you wait before being seen by our physicians.       _____________________________________________________________  Should you have questions after your visit to [facility], please contact our office at [phone] between the hours of 8:30 a.m. and 4:30 p.m.  Voicemails left after 4:30 p.m. will not be returned until the following business day.  For prescription refill requests, have your pharmacy contact our office.

## 2015-06-18 NOTE — H&P (Signed)
Active Problems  1. Bladder cancer metastasized to liver (C67.9,C78.7)  2. 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 Gross hematuria (R31.0)  4. Inguinal hernia bilateral, non-recurrent (K40.20)  5. Lymphadenopathy (R59.1)  6. Mass of urinary bladder (N32.89)  7. 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  He has metastatic bladder cancer to lymph nodes and liver and has completed his initial chemotherapy with carboplatin and gemzar on 01/23/15.  He had a negative PET in September and is going to have a  PET in early December. He is voiding well but has occasional red urine. His Cr was up on his last check in September to 1.71 but he had no hydro on the scan.   Past Medical History  1. History of arthritis (Z87.39)  2. History of hyperlipidemia (Z86.39)  3. History of hypertension (Z86.79)  Surgical History  1. History of Cystoscopy With Fulguration Large Lesion (Over 5cm)  2. History of Knee Surgery Left  3. History of Needle Biopsy Of Prostate  Current Meds  1. Amino Acid CAPS;  Therapy: (Recorded:26Feb2016) to Recorded  Allergies  1. No Known Drug Allergies  Family History  1. Family history of malignant neoplasm (Z80.9) : Mother  Social History  1. Caffeine use (F15.90)   1 cup per day  2. Death in the family, father   Natural  3. Death in the family, mother   cancer  75. Divorced  5. Never a smoker  6. No alcohol use  7. Number of children   1 son  8. Occupation   Disabled   Past and social history reviewed and updated.   Review of Systems  Constitutional: no recent weight loss.  Hematologic/Lymphatic: no swollen glands.  Cardiovascular: no chest pain and no leg swelling.  Respiratory: no shortness of  breath.  Musculoskeletal: no bone pain.    Vitals Vital Signs [Data Includes: Last 1 Day]  Recorded: [id number] 08:59AM  Blood Pressure: 128 / 81 Temperature: 97.5 F Heart Rate: 57  Results/Data  The following images/tracing/specimen were independently visualized:  PET/CT from 02/20/15 reviewed.  The following clinical lab reports were reviewed:  UA reviewed.    Assessment  1. Gross hematuria (R31.0)  2. 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 He has intermittent hematuria.   Plan Carcinoma of bladder metastatic to intra-abdominal lymph nodes   1. [facility]  Follow-up  Status: Hold For - Appointment,Date of Service   Requested for: [phone]  2. Cysto; Status:Need Information - Billable Problem; Requested for:18Nov2016;  Gross hematuria   3. Cysto; Status:Hold For - Appointment,Date of Service; Requested for:18Nov2016;   4. URINE CULTURE; Status:Hold For - Specimen/Data Collection,Appointment; Requested  for:18Nov2016;   5. URINE CYTOLOGY; Status:Hold For - Specimen/Data Collection,Appointment;  Requested for:18Nov2016;  Nodular prostate without lower urinary tract symptoms   6. UA With REFLEX; [Do Not Release]; Status:Hold For - [employer];  Requested for:16Nov2016;    Urine culture and cytology. He will return for cystoscopy.   istory of Present Illness  Jesus [name] is a 64yo with a hx of metastatic TCC s/p chemo here for cystoscopy. He has a positive urine cytology.  Vitals Vital Signs [Data Includes: Last 1 Day]  Recorded: [id number] 02:39PM  Blood Pressure: 117 / 77 Temperature: 97.6 F Heart Rate: 73  Procedure  Procedure: Cystoscopy   Indication: History of Urothelial Carcinoma.  Informed Consent: Risks, benefits, and potential adverse events were discussed and informed consent was obtained from the patient . Specific risks  including, but not limited to bleeding, infection, pain, allergic reaction etc. were explained.  Prep: The patient was prepped with betadine.  Anesthesia:. Local anesthesia was administered intraurethrally with 2% lidocaine jelly.  Antibiotic prophylaxis: Ciprofloxacin.  Procedure Note:  Urethral meatus:. No abnormalities.  Anterior urethra: No abnormalities.  Prostatic urethra: No abnormalities.  Bladder: Visulization was clear. The ureteral orifices were in the normal anatomic position bilaterally. Multiple tumors were identified in the bladder. A papillary tumor was seen in the bladder. This tumor was located toward the midline, near the dome of the bladder.    Assessment  1. Mass of urinary bladder (N32.89)  2. 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 UA With REFLEX; [Do Not Release]; Status:Hold For - [employer];  Requested for:06Dec2016;    Schedule for TURBT   Discussion/Summary  We discussed operative biopsy / transurethral resection as the best next step for  diagnostic and therapeutic purposes with goals being to remove all visible cancer and  obtain tissue for pathologic exam. We discussed that for some low-grade tumors, this  may be all the treatment required, but that for many other tumors such as high-grade  lesions, further therapy including surgery and or chemotherapy may be warranted. We  also outlined the fact that any bladder cancer diagnosis will require close follow-up with  periodic upper and lower tract evaluation. We discussed risks including bleeding,  infection, damage to kidney / ureter / bladder including bladder perforation which can  typically managed with prolonged foley catheterization. We mentioned anesthetic and  other rare risks including DVT, PE, MI, and mortality. I also mentioned that adjunctive  procedures such as ureteral stenting, retrograde pyelography, and ureteroscopy may  be  necessary to fully evaluate the urinary tract depending on intra-operative findings. After  answering all questions to the patient's satisfaction, they wish to proceed.

## 2015-06-19 ENCOUNTER — Ambulatory Visit (HOSPITAL_COMMUNITY): Payer: Commercial Managed Care - HMO | Admitting: Anesthesiology

## 2015-06-19 ENCOUNTER — Encounter (HOSPITAL_COMMUNITY): Payer: Self-pay

## 2015-06-19 ENCOUNTER — Encounter (HOSPITAL_COMMUNITY)
Admission: RE | Disposition: A | Discharge status: Home / Self Care | Payer: Self-pay | Source: Ambulatory Visit | Attending: Urology

## 2015-06-19 ENCOUNTER — Observation Stay (HOSPITAL_COMMUNITY)
Admission: RE | Admit: 2015-06-19 | Discharge: 2015-06-20 | Disposition: A | Discharge status: Home / Self Care | Payer: Commercial Managed Care - HMO | Source: Ambulatory Visit | Attending: Urology | Admitting: Urology

## 2015-06-19 ENCOUNTER — Telehealth (HOSPITAL_COMMUNITY): Payer: Self-pay | Admitting: Hematology & Oncology

## 2015-06-19 DIAGNOSIS — E785 Hyperlipidemia, unspecified: Secondary | ICD-10-CM | POA: Insufficient documentation

## 2015-06-19 DIAGNOSIS — R310 Gross hematuria: Secondary | ICD-10-CM | POA: Diagnosis not present

## 2015-06-19 DIAGNOSIS — F329 Major depressive disorder, single episode, unspecified: Secondary | ICD-10-CM | POA: Insufficient documentation

## 2015-06-19 DIAGNOSIS — N402 Nodular prostate without lower urinary tract symptoms: Secondary | ICD-10-CM | POA: Insufficient documentation

## 2015-06-19 DIAGNOSIS — I10 Essential (primary) hypertension: Secondary | ICD-10-CM | POA: Insufficient documentation

## 2015-06-19 DIAGNOSIS — K4020 Bilateral inguinal hernia, without obstruction or gangrene, not specified as recurrent: Secondary | ICD-10-CM | POA: Diagnosis not present

## 2015-06-19 DIAGNOSIS — C679 Malignant neoplasm of bladder, unspecified: Secondary | ICD-10-CM | POA: Diagnosis not present

## 2015-06-19 DIAGNOSIS — M1990 Unspecified osteoarthritis, unspecified site: Secondary | ICD-10-CM | POA: Insufficient documentation

## 2015-06-19 DIAGNOSIS — C787 Secondary malignant neoplasm of liver and intrahepatic bile duct: Secondary | ICD-10-CM | POA: Diagnosis not present

## 2015-06-19 DIAGNOSIS — C772 Secondary and unspecified malignant neoplasm of intra-abdominal lymph nodes: Secondary | ICD-10-CM | POA: Diagnosis not present

## 2015-06-19 DIAGNOSIS — N500 Atrophy of testis: Secondary | ICD-10-CM | POA: Diagnosis not present

## 2015-06-19 DIAGNOSIS — N281 Cyst of kidney, acquired: Secondary | ICD-10-CM | POA: Insufficient documentation

## 2015-06-19 HISTORY — PX: TRANSURETHRAL RESECTION OF BLADDER TUMOR: SHX2575

## 2015-06-19 SURGERY — TURBT (TRANSURETHRAL RESECTION OF BLADDER TUMOR)
Anesthesia: General

## 2015-06-19 MED ORDER — HYDROMORPHONE HCL 1 MG/ML IJ SOLN
INTRAMUSCULAR | Status: AC
  Filled 2015-06-19: qty 1

## 2015-06-19 MED ORDER — MIDAZOLAM HCL 2 MG/2ML IJ SOLN
INTRAMUSCULAR | Status: AC
  Filled 2015-06-19: qty 2

## 2015-06-19 MED ORDER — ONDANSETRON HCL 4 MG/2ML IJ SOLN: 4.0000 mg | INTRAMUSCULAR | Status: DC | PRN

## 2015-06-19 MED ORDER — PROPOFOL 10 MG/ML IV BOLUS
INTRAVENOUS | Status: DC | PRN
  Administered 2015-06-19: 160 mg via INTRAVENOUS

## 2015-06-19 MED ORDER — MIDAZOLAM HCL 5 MG/5ML IJ SOLN
INTRAMUSCULAR | Status: DC | PRN
  Administered 2015-06-19: 1 mg via INTRAVENOUS

## 2015-06-19 MED ORDER — PROPOFOL 10 MG/ML IV BOLUS
INTRAVENOUS | Status: AC
  Filled 2015-06-19: qty 20

## 2015-06-19 MED ORDER — LACTATED RINGERS IV SOLN: INTRAVENOUS | Status: DC

## 2015-06-19 MED ORDER — 0.9 % SODIUM CHLORIDE (POUR BTL) OPTIME
TOPICAL | Status: DC | PRN
  Administered 2015-06-19: 1000 mL

## 2015-06-19 MED ORDER — LABETALOL HCL 5 MG/ML IV SOLN
5.0000 mg | INTRAVENOUS | Status: DC | PRN
  Administered 2015-06-19: 5 mg via INTRAVENOUS

## 2015-06-19 MED ORDER — DIPHENHYDRAMINE HCL 12.5 MG/5ML PO ELIX: 12.5000 mg | ORAL_SOLUTION | Freq: Four times a day (QID) | ORAL | Status: DC | PRN

## 2015-06-19 MED ORDER — FLEET ENEMA 7-19 GM/118ML RE ENEM: 1.0000 | ENEMA | Freq: Once | RECTAL | Status: DC | PRN

## 2015-06-19 MED ORDER — SUGAMMADEX SODIUM 200 MG/2ML IV SOLN
INTRAVENOUS | Status: AC
  Filled 2015-06-19: qty 2

## 2015-06-19 MED ORDER — LABETALOL HCL 5 MG/ML IV SOLN
5.0000 mg | Freq: Once | INTRAVENOUS | Status: AC
  Administered 2015-06-19: 5 mg via INTRAVENOUS

## 2015-06-19 MED ORDER — DIPHENHYDRAMINE HCL 50 MG/ML IJ SOLN: 12.5000 mg | Freq: Four times a day (QID) | INTRAMUSCULAR | Status: DC | PRN

## 2015-06-19 MED ORDER — BISACODYL 10 MG RE SUPP: 10.0000 mg | Freq: Every day | RECTAL | Status: DC | PRN

## 2015-06-19 MED ORDER — DEXAMETHASONE SODIUM PHOSPHATE 10 MG/ML IJ SOLN
INTRAMUSCULAR | Status: DC | PRN
  Administered 2015-06-19: 10 mg via INTRAVENOUS

## 2015-06-19 MED ORDER — FENTANYL CITRATE (PF) 100 MCG/2ML IJ SOLN
INTRAMUSCULAR | Status: DC | PRN
  Administered 2015-06-19 (×2): 50 ug via INTRAVENOUS
  Administered 2015-06-19 (×2): 25 ug via INTRAVENOUS
  Administered 2015-06-19: 100 ug via INTRAVENOUS

## 2015-06-19 MED ORDER — FENTANYL CITRATE (PF) 250 MCG/5ML IJ SOLN
INTRAMUSCULAR | Status: AC
  Filled 2015-06-19: qty 5

## 2015-06-19 MED ORDER — LACTATED RINGERS IV SOLN
INTRAVENOUS | Status: DC | PRN
  Administered 2015-06-19: 09:00:00 via INTRAVENOUS

## 2015-06-19 MED ORDER — HYDROMORPHONE HCL 1 MG/ML IJ SOLN
0.2500 mg | INTRAMUSCULAR | Status: DC | PRN
  Administered 2015-06-19 (×4): 0.5 mg via INTRAVENOUS

## 2015-06-19 MED ORDER — ROCURONIUM BROMIDE 100 MG/10ML IV SOLN
INTRAVENOUS | Status: DC | PRN
  Administered 2015-06-19: 5 mg via INTRAVENOUS
  Administered 2015-06-19: 25 mg via INTRAVENOUS
  Administered 2015-06-19: 5 mg via INTRAVENOUS

## 2015-06-19 MED ORDER — PROMETHAZINE HCL 25 MG/ML IJ SOLN: 6.2500 mg | INTRAMUSCULAR | Status: DC | PRN

## 2015-06-19 MED ORDER — MAGNESIUM HYDROXIDE 400 MG/5ML PO SUSP: 30.0000 mL | Freq: Every day | ORAL | Status: DC | PRN

## 2015-06-19 MED ORDER — HYDROCODONE-ACETAMINOPHEN 5-325 MG PO TABS: 1.0000 | ORAL_TABLET | ORAL | Status: DC | PRN

## 2015-06-19 MED ORDER — KCL IN DEXTROSE-NACL 20-5-0.45 MEQ/L-%-% IV SOLN
INTRAVENOUS | Status: DC
  Administered 2015-06-19 (×2): via INTRAVENOUS
  Filled 2015-06-19 (×3): qty 1000

## 2015-06-19 MED ORDER — DEXAMETHASONE SODIUM PHOSPHATE 10 MG/ML IJ SOLN
INTRAMUSCULAR | Status: AC
  Filled 2015-06-19: qty 1

## 2015-06-19 MED ORDER — CIPROFLOXACIN IN D5W 400 MG/200ML IV SOLN
400.0000 mg | INTRAVENOUS | Status: AC
  Administered 2015-06-19: 400 mg via INTRAVENOUS

## 2015-06-19 MED ORDER — DOCUSATE SODIUM 100 MG PO CAPS
100.0000 mg | ORAL_CAPSULE | Freq: Two times a day (BID) | ORAL | Status: DC
  Administered 2015-06-19 – 2015-06-20 (×2): 100 mg via ORAL
  Filled 2015-06-19 (×2): qty 1

## 2015-06-19 MED ORDER — HYOSCYAMINE SULFATE 0.125 MG SL SUBL
0.1250 mg | SUBLINGUAL_TABLET | SUBLINGUAL | Status: DC | PRN
  Filled 2015-06-19: qty 1

## 2015-06-19 MED ORDER — SUCCINYLCHOLINE CHLORIDE 20 MG/ML IJ SOLN
INTRAMUSCULAR | Status: DC | PRN
  Administered 2015-06-19: 100 mg via INTRAVENOUS

## 2015-06-19 MED ORDER — ONDANSETRON HCL 4 MG/2ML IJ SOLN
INTRAMUSCULAR | Status: DC | PRN
Start: 2015-06-19 — End: 2015-06-19
  Administered 2015-06-19: 4 mg via INTRAVENOUS

## 2015-06-19 MED ORDER — HYDROCODONE-ACETAMINOPHEN 5-325 MG PO TABS: 1.0000 | ORAL_TABLET | Freq: Four times a day (QID) | ORAL | Status: DC | PRN

## 2015-06-19 MED ORDER — CIPROFLOXACIN IN D5W 400 MG/200ML IV SOLN
INTRAVENOUS | Status: AC
  Filled 2015-06-19: qty 200

## 2015-06-19 MED ORDER — LIDOCAINE HCL (CARDIAC) 20 MG/ML IV SOLN
INTRAVENOUS | Status: DC | PRN
  Administered 2015-06-19: 100 mg via INTRAVENOUS

## 2015-06-19 MED ORDER — ZOLPIDEM TARTRATE 5 MG PO TABS: 5.0000 mg | ORAL_TABLET | Freq: Every evening | ORAL | Status: DC | PRN

## 2015-06-19 MED ORDER — STERILE WATER FOR IRRIGATION IR SOLN
Status: DC | PRN
  Administered 2015-06-19: 30 mL

## 2015-06-19 MED ORDER — SUGAMMADEX SODIUM 200 MG/2ML IV SOLN
INTRAVENOUS | Status: DC | PRN
  Administered 2015-06-19: 200 mg via INTRAVENOUS

## 2015-06-19 MED ORDER — HYDROMORPHONE HCL 1 MG/ML IJ SOLN: 0.5000 mg | INTRAMUSCULAR | Status: DC | PRN

## 2015-06-19 MED ORDER — ACETAMINOPHEN 325 MG PO TABS: 650.0000 mg | ORAL_TABLET | ORAL | Status: DC | PRN

## 2015-06-19 MED ORDER — STERILE WATER FOR IRRIGATION IR SOLN: Status: DC | PRN

## 2015-06-19 MED ORDER — ONDANSETRON HCL 4 MG/2ML IJ SOLN
INTRAMUSCULAR | Status: AC
Start: 2015-06-19 — End: 2015-06-19
  Filled 2015-06-19: qty 2

## 2015-06-19 MED ORDER — SODIUM CHLORIDE 0.9 % IR SOLN
Status: DC | PRN
  Administered 2015-06-19: 18000 mL

## 2015-06-19 MED ORDER — LABETALOL HCL 5 MG/ML IV SOLN
INTRAVENOUS | Status: AC
  Filled 2015-06-19: qty 4

## 2015-06-19 SURGICAL SUPPLY — 25 items
BAG URINE DRAINAGE (UROLOGICAL SUPPLIES) ×2 IMPLANT
BAG URO CATCHER STRL LF (MISCELLANEOUS) ×3 IMPLANT
CATH FOLEY 3WAY 30CC 22FR (CATHETERS) IMPLANT
CATH HEMA 3WAY 30CC 22FR COUDE (CATHETERS) ×2 IMPLANT
CATH URET 5FR 28IN OPEN ENDED (CATHETERS) IMPLANT
GLOVE BIOGEL PI IND STRL 6.5 (GLOVE) IMPLANT
GLOVE BIOGEL PI IND STRL 7.0 (GLOVE) IMPLANT
GLOVE BIOGEL PI INDICATOR 6.5 (GLOVE) ×2
GLOVE BIOGEL PI INDICATOR 7.0 (GLOVE) ×6
GLOVE SURG SS PI 8.0 STRL IVOR (GLOVE) ×2 IMPLANT
GOWN STRL REUS W/ TWL LRG LVL3 (GOWN DISPOSABLE) IMPLANT
GOWN STRL REUS W/TWL LRG LVL3 (GOWN DISPOSABLE) ×6
GOWN STRL REUS W/TWL XL LVL3 (GOWN DISPOSABLE) ×3 IMPLANT
HOLDER FOLEY CATH W/STRAP (MISCELLANEOUS) IMPLANT
KIT ASPIRATION TUBING (SET/KITS/TRAYS/PACK) ×3 IMPLANT
LOOP CUT BIPOLAR 24F LRG (ELECTROSURGICAL) ×2 IMPLANT
MANIFOLD NEPTUNE II (INSTRUMENTS) ×3 IMPLANT
PACK CYSTO (CUSTOM PROCEDURE TRAY) ×3 IMPLANT
PLUG CATH AND CAP STER (CATHETERS) ×2 IMPLANT
SUT ETHILON 3 0 PS 1 (SUTURE) IMPLANT
SYR 30ML LL (SYRINGE) ×2 IMPLANT
SYRINGE IRR TOOMEY STRL 70CC (SYRINGE) IMPLANT
TUBING CONNECTING 10 (TUBING) ×2 IMPLANT
TUBING CONNECTING 10' (TUBING) ×1
WATER STERILE IRR 1000ML POUR (IV SOLUTION) ×2 IMPLANT

## 2015-06-19 NOTE — Care Management Note (Signed)
Case Management Note  Patient Details  Name: Jesus [name] MRN: [id number] Date of Birth: [birth year]  Subjective/Objective:   64 y/o m admitted w/Bladder Ca. S/p TURP. From home.                 Action/Plan:d/c plan home.   Expected Discharge Date:                 Expected Discharge Plan:  Home/Self Care  In-House Referral:     Discharge planning Services  CM Consult  Post Acute Care Choice:    Choice offered to:     DME Arranged:    DME Agency:     HH Arranged:    HH Agency:     Status of Service:  In process, will continue to follow  Medicare Important Message Given:    Date Medicare IM Given:    Medicare IM give by:    Date Additional Medicare IM Given:    Additional Medicare Important Message give by:     If discussed at [place] of Stay Meetings, dates discussed:    Additional Comments:  Dessa Phi, RN 06/19/2015, 1:52 PM

## 2015-06-19 NOTE — Anesthesia Postprocedure Evaluation (Signed)
Anesthesia Post Note  Patient: Jesus [name]  Procedure(s) Performed: Procedure(s) (LRB): TRANSURETHRAL RESECTION OF BLADDER TUMOR (TURBT) (N/A)  Patient location during evaluation: PACU Anesthesia Type: General Level of consciousness: sedated Pain management: pain level controlled Vital Signs Assessment: post-procedure vital signs reviewed and stable Respiratory status: spontaneous breathing and respiratory function stable Cardiovascular status: stable Anesthetic complications: no    Last Vitals:  Filed Vitals:   06/19/15 1200 06/19/15 1205  BP: 159/100 171/97  Pulse: 72 69  Temp: 36.7 C   Resp: 15 17    Last Pain:  Filed Vitals:   06/19/15 1209  PainSc: [place]

## 2015-06-19 NOTE — Telephone Encounter (Signed)
Per Sam s  Pt has coverage for 2017  CALL REF# [id number]

## 2015-06-19 NOTE — Op Note (Signed)
NAMEJADYNN, Jesus [name]              ACCOUNT NO.:  [id number]  MEDICAL RECORD NO.:  [id number]  LOCATION:  [facility]                         FACILITY:  [facility]  PHYSICIAN:  Jesus [name], M.D.    DATE OF BIRTH:  02-Jan-1951  DATE OF PROCEDURE:  06/19/2015 DATE OF DISCHARGE:                              OPERATIVE REPORT   OPERATIVE PROCEDURE:  Transurethral resection of large greater than 5 cm bladder tumor.  PREOPERATIVE DIAGNOSIS:  Recurrent bladder cancer.  POSTOPERATIVE DIAGNOSIS:  Recurrent bladder cancer.  SURGEON:  Jesus [name], M.D.  ANESTHESIA:  General.  SPECIMENS:  Bladder tumor chips.  DRAINS:  A 24-French 3-way Foley catheter.  BLOOD LOSS:  Approximately 50 mL.  COMPLICATIONS:  None.  INDICATIONS:  [name] is a 64 year old white male with a history of metastatic bladder cancer, did undergo prior TURBT followed by chemotherapy.  He had a complete response to chemotherapy from the lymphadenopathy standpoint, but he had persistent positive cytology and cystoscopy revealed recurrent multifocal bladder tumor.  He has had hematuria, so it was felt the re-resection was indicated.  FINDINGS OF PROCEDURE:  He was taken to the operating room where general anesthetic was induced.  He was given Cipro.  He was placed in lithotomy position.  He was fitted with a PAS hose.  His perineum and genitalia were prepped with Betadine solution, he was draped in usual sterile fashion.  Cystoscopy was performed using the 23-French scope with 30 and 70 degree lenses.  Examination revealed a normal urethra.  The prostatic urethra was short without significant obstruction, but there was tumor growing into the left bladder neck.  Initial inspection of bladder was obscured by blood clot.  The clot was evacuated and I then dilated the urethra to 30-French with Leander Rams sounds and placed a 28-French continuous flow resectoscope sheath.  This was fitted with an Beatrix Fetters handle with a  30- degree lens and a bipolar loop saline was used as the irrigant.  Once the clot had cleared, the patient was noted to have extensive multifocal bladder cancer that extended around the bladder neck from approximately 7 o'clock to the 6 o'clock position, and extended proximally on to the left lateral sidewall of the bladder for a distance greater than 5 cm and then on to the dome as well.  The posterior wall and right lateral wall of the bladder were otherwise relatively free of cancer.  The left ureteral orifice was obscured by tumor, the right was in the normal anatomic position.  Once the initial inspection was performed, I debulked the tumor primarily in the bladder neck and left lateral wall area.  There was no hope of resecting the entire tumor mass, however, I tried resect away all of the superficial tumor fronds that were prone to bleeding and eventually got the tumor bed down to a smooth surface were hemostasis could be achieved.  There were some residual untouched tumor on the dome, but I just did not feel there would be any value in attempting to resect this.  Once the tumor had been resected sufficiently to remove the superficial fronds and necrotic tissue and hemostasis was achieved.  The bladder was evacuated free  of chips.  Final inspection revealed no retained chips. No active bleeding.  The left ureteral orifice was never seen.  The scope was removed.  Pressure on the bladder produced a good stream.  A 24-French hematuria catheter was inserted.  The balloon was filled with 30 mL of sterile fluid.  The catheter was placed on continuous irrigation with saline and placed to straight drainage.  I felt comfortable using a three-way catheter in this particular situation because there was no area of bladder wall perforation or thinning of the muscles noted during the resection.  The patient was then taken down from lithotomy position.  His anesthetic was reversed.  He was  moved to recovery room in stable condition.  There were no complications.     Jesus [name], M.D.     JJW/MEDQ  D:  06/19/2015  T:  06/19/2015  Job:  [id number]

## 2015-06-19 NOTE — Discharge Instructions (Signed)

## 2015-06-19 NOTE — Anesthesia Preprocedure Evaluation (Addendum)
Anesthesia Evaluation  Patient identified by MRN, date of birth, ID band Patient awake    Reviewed: Allergy & Precautions, NPO status , Patient's Chart, lab work & pertinent test results  History of Anesthesia Complications Negative for: history of anesthetic complications  Airway Mallampati: II  TM Distance: >3 FB Neck ROM: Full    Dental  (+) Teeth Intact, Dental Advisory Given   Pulmonary neg pulmonary ROS,    Pulmonary exam normal        Cardiovascular negative cardio ROS Normal cardiovascular exam     Neuro/Psych PSYCHIATRIC DISORDERS Depression    GI/Hepatic negative GI ROS, Neg liver ROS,   Endo/Other  negative endocrine ROS  Renal/GU negative Renal ROS     Musculoskeletal   Abdominal   Peds  Hematology   Anesthesia Other Findings   Reproductive/Obstetrics                            Anesthesia Physical Anesthesia Plan  ASA: III  Anesthesia Plan: General   Post-op Pain Management:    Induction: Intravenous  Airway Management Planned: Oral ETT  Additional Equipment:   Intra-op Plan:   Post-operative Plan: Extubation in OR  Informed Consent: I have reviewed the patients History and Physical, chart, labs and discussed the procedure including the risks, benefits and alternatives for the proposed anesthesia with the patient or authorized representative who has indicated his/her understanding and acceptance.   Dental advisory given  Plan Discussed with: CRNA, Anesthesiologist and Surgeon  Anesthesia Plan Comments:        Anesthesia Quick Evaluation

## 2015-06-19 NOTE — Brief Op Note (Signed)
06/19/2015  10:34 AM  PATIENT:  Jesus [name]  64 y.o. male  PRE-OPERATIVE DIAGNOSIS:  bladder cancer  POST-OPERATIVE DIAGNOSIS:  bladder cancer > 5cm   PROCEDURE:  Procedure(s): TRANSURETHRAL RESECTION OF BLADDER TUMOR (TURBT) (N/A)>5cm  SURGEON:  Surgeon(s) and Role:    * Irine Seal, MD - Primary  PHYSICIAN ASSISTANT:   ASSISTANTS: none   ANESTHESIA:   general  EBL:     BLOOD ADMINISTERED:none  DRAINS: Urinary Catheter (Foley)   LOCAL MEDICATIONS USED:  NONE  SPECIMEN:  Source of Specimen:  bladder tumor chips  DISPOSITION OF SPECIMEN:  PATHOLOGY  COUNTS:  YES  TOURNIQUET:  * No tourniquets in log *  DICTATION: .Other Dictation: Dictation Number [id number]  PLAN OF CARE: Admit for overnight observation  PATIENT DISPOSITION:  PACU - hemodynamically stable.   Delay start of Pharmacological VTE agent (>24hrs) due to surgical blood loss or risk of bleeding: yes

## 2015-06-19 NOTE — Progress Notes (Signed)
Patient ID: Jesus [name], male   DOB: 1951-06-10, 64 y.o.   MRN: [id number]  Mr. Willett is doing well post op.  His urine is clear on CBI.  BP 170/97 mmHg  Pulse 71  Temp(Src) 97.9 F (36.6 C) (Oral)  Resp 18  Ht 5\' 9"  (1.753 m)  Wt 71.895 kg (158 lb 8 oz)  BMI 23.40 kg/m2  SpO2 97%  Imp: metastatic bladder cancer with bulky recurrent bladder disease.  Plan: Foley out and home tomorrow.   I will discuss treatment options with Dr. Whitney Muse.

## 2015-06-19 NOTE — Patient Instructions (Addendum)
[place]   CHEMOTHERAPY INSTRUCTIONS  Jesus [name] takes 1 hour to infuse the first time and then 30 minutes on each infusion thereafter. You will not need to take any premeds prior to the Tecentriq infusion. You will come to receive this infusion once every 21 days.    The most common side effects of TECENTRIQ include: Feeling tired  Decreased appetite  Nausea  Urinary tract infection  Fever  Constipation   TECENTRIQ is a prescription medicine used to treat: a type of bladder cancer called urothelial carcinoma. TECENTRIQ may be used when your bladder cancer has spread or cannot be removed by surgery (advanced urothelial carcinoma) and,  you have tried chemotherapy that contains platinum, and it did not work or is no longer working   [place]  What is the most important information about TECENTRIQ? TECENTRIQ can cause your immune system to attack normal organs and tissues in many areas of your body and can affect the way they work. These problems can sometimes become serious or life threatening and can lead to death.  Getting medical treatment right away may help keep these problems from becoming more serious. Your healthcare provider may treat you with corticosteroid or hormone replacement medicines. Your healthcare provider may delay or completely stop treatment with TECENTRIQ if you have severe side effects.  Call or see your healthcare provider right away if you get any symptoms of the following problems or these symptoms get worse. TECENTRIQ can cause serious side effects, including: Lung problems (pneumonitis)-signs and symptoms may include new or worsening cough, shortness of breath, and chest pain   Liver problems (hepatitis)-signs and symptoms of hepatitis may include yellowing of your skin or the whites of your eyes, severe nausea or vomiting, pain on the right side of your stomach area (abdomen), drowsiness, dark urine  (tea colored), bleeding or bruising more easily than normal, and feeling less hungry than usual   Intestinal problems (colitis)-signs and symptoms of colitis may include diarrhea (loose stools) or more bowel movements than usual, blood in your stools or dark, tarry, sticky stools, and severe stomach area (abdomen) pain or tenderness   Hormone gland problems (especially the pituitary, thyroid, adrenal glands, and pancreas)-signs and symptoms that your hormone glands are not working properly may include headaches that will not go away or unusual headaches, extreme tiredness, weight gain or weight loss, dizziness or fainting, feeling more hungry or thirsty than usual, hair loss, changes in mood or behavior (such as decreased sex drive, irritability, or forgetfulness), feeling cold, constipation, your voice gets deeper, urinating more often than usual, nausea or vomiting, and stomach area (abdomen) pain   Nervous system problems (neuropathies, meningoencephalitis)-signs of nervous system problems may include severe muscle weakness, numbness or tingling in hands and feet, fever, confusion, changes in mood or behavior, extreme sensitivity to light, and neck stiffness   Inflammation of the eyes-symptoms may include blurry vision, double vision, or other vision problems, and eye pain or redness   Severe infections-symptoms of infection may include fever, cough, frequent urination, flu-like symptoms, and pain when urinating   Severe infusion reactions-signs and symptoms of infusion reactions may include chills or shaking, itching or rash, flushing, shortness of breath or wheezing, dizziness, fever, feeling like passing out, back or neck pain, and facial swelling   These are not all the possible side effects of TECENTRIQ. Ask your healthcare provider or pharmacist for more information.   For more information call the patient support line:  1 (844) TECENTRIQ [phone])    EDUCATIONAL MATERIALS GIVEN AND  REVIEWED: Specific Instructions Sheets: Tecentriq    SELF CARE ACTIVITIES WHILE ON CHEMOTHERAPY: Increase your fluid intake to 64oz a day of water/decaff beverages. No alcohol intake., No aspirin or other medications unless approved by your oncologist., Eat foods that are light and easy to digest., Eat foods at cold or room temperature., No fried, fatty, or spicy foods immediately before or after treatment., Have teeth cleaned professionally before starting treatment. Keep dentures and partial plates clean., Use soft toothbrush and do not use mouthwashes that contain alcohol. Biotene is a good mouthwash that is available at most pharmacies or may be ordered by calling [phone]., Use warm salt water gargles (1 teaspoon salt per 1 quart warm water) before and after meals and at bedtime. Or you may rinse with 2 tablespoons of three -percent hydrogen peroxide mixed in eight ounces of water., Always use sunscreen with SPF (Sun Protection Factor) of 30 or higher., Use your nausea medication as directed to prevent nausea., Use your stool softener or laxative as directed to prevent constipation. and Use your anti-diarrheal medication as directed to stop diarrhea.  Please wash your hands for at least 30 seconds using warm soapy water. Handwashing is the #1 way to prevent the spread of germs. Stay away from sick people or people who are getting over a cold. If you develop respiratory systems such as green/yellow mucus production or productive cough or persistent cough let us know and we will see if you need an antibiotic. It is a good idea to keep a pair of gloves on when going into grocery stores/[employer] to decrease your risk of coming into contact with germs on the carts, etc. Carry alcohol hand gel with you at all times and use it frequently if out in public. All foods need to be cooked thoroughly. No raw foods. No medium or undercooked meats, eggs. If your food is cooked medium well, it does not need to be  hot pink or saturated with bloody liquid at all. Vegetables and fruits need to be washed/rinsed under the faucet with a dish detergent before being consumed. You can eat raw fruits and vegetables unless we tell you otherwise but it would be best if you cooked them or bought frozen. Do not eat off of salad bars or hot bars unless you really trust the cleanliness of the restaurant. If you need dental work, please let Dr. Whitney Muse know before you go for your appointment so that we can coordinate the best possible time for you in regards to your chemo regimen. You need to also let your dentist know that you are actively taking chemo. We may need to do labs prior to your dental appointment. We also want your bowels moving at least every other day. If this is not happening, we need to know so that we can get you on a bowel regimen to help you go.      MEDICATIONS: You have been given prescriptions for the following medications:  Zofran/Ondansetron 8mg  tablet. Take 1 tablet every 8 hours as needed for nausea/vomiting.     SYMPTOMS TO REPORT AS SOON AS POSSIBLE AFTER TREATMENT:  FEVER GREATER THAN 100.5 F  CHILLS WITH OR WITHOUT FEVER  NAUSEA AND VOMITING THAT IS NOT CONTROLLED WITH YOUR NAUSEA MEDICATION  UNUSUAL SHORTNESS OF BREATH  UNUSUAL BRUISING OR BLEEDING  TENDERNESS IN MOUTH AND THROAT WITH OR WITHOUT PRESENCE OF ULCERS  URINARY PROBLEMS  BOWEL PROBLEMS  UNUSUAL RASH    Wear comfortable clothing and clothing appropriate for easy access to any Portacath or PICC line. Let us know if there is anything that we can do to make your therapy better!      I have been informed and understand all of the instructions given to me and have received a copy. I have been instructed to call the clinic [phone] or my family physician as soon as possible for continued medical care, if indicated. I do not have any more questions at this time but understand that I may call the [facility] or  the Patient Navigator at [phone] during office hours should I have questions or need assistance in obtaining follow-up care.

## 2015-06-19 NOTE — Interval H&P Note (Signed)
History and Physical Interval Note:  06/19/2015 9:23 AM  Jesus [name]  has presented today for surgery, with the diagnosis of bladder cancer  The various methods of treatment have been discussed with the patient and family. After consideration of risks, benefits and other options for treatment, the patient has consented to  Procedure(s): TRANSURETHRAL RESECTION OF BLADDER TUMOR (TURBT) (N/A) as a surgical intervention .  The patient's history has been reviewed, patient examined, no change in status, stable for surgery.  I have reviewed the patient's chart and labs.  Questions were answered to the patient's satisfaction.     Folasade Mooty J

## 2015-06-19 NOTE — Care Management Note (Deleted)
Case Management Note  Patient Details  Name: NOUMAN MALAGON MRN: [id number] Date of Birth: 1950-07-03  Subjective/Objective:   64 y/o m admitted w/Bilateral ureteral calculi. From home.                 Action/Plan:d/c home.   Expected Discharge Date:                 Expected Discharge Plan:  Home/Self Care  In-House Referral:     Discharge planning Services  CM Consult  Post Acute Care Choice:    Choice offered to:     DME Arranged:    DME Agency:     HH Arranged:    HH Agency:     Status of Service:  In process, will continue to follow  Medicare Important Message Given:    Date Medicare IM Given:    Medicare IM give by:    Date Additional Medicare IM Given:    Additional Medicare Important Message give by:     If discussed at [place] of Stay Meetings, dates discussed:    Additional Comments:  Dessa Phi, RN 06/19/2015, 1:48 PM

## 2015-06-19 NOTE — Anesthesia Procedure Notes (Signed)
Procedure Name: Intubation Date/Time: 06/19/2015 9:36 AM Performed by: Anne Fu Pre-anesthesia Checklist: Patient identified, Emergency Drugs available and Suction available Patient Re-evaluated:Patient Re-evaluated prior to inductionOxygen Delivery Method: Circle system utilized Preoxygenation: Pre-oxygenation with 100% oxygen Intubation Type: IV induction Ventilation: Mask ventilation without difficulty Laryngoscope Size: Miller and 2 Grade View: Grade I Tube type: Oral Tube size: 7.0 mm Number of attempts: 1 Airway Equipment and Method: Stylet Placement Confirmation: ETT inserted through vocal cords under direct vision,  positive ETCO2,  CO2 detector and breath sounds checked- equal and bilateral Secured at: 24 cm Tube secured with: Tape Dental Injury: Teeth and Oropharynx as per pre-operative assessment

## 2015-06-19 NOTE — Transfer of Care (Signed)
Immediate Anesthesia Transfer of Care Note  Patient: Jesus [name]  Procedure(s) Performed: Procedure(s): TRANSURETHRAL RESECTION OF BLADDER TUMOR (TURBT) (N/A)  Patient Location: PACU  Anesthesia Type:General  Level of Consciousness:  sedated, patient cooperative and responds to stimulation  Airway & Oxygen Therapy:Patient Spontanous Breathing and Patient connected to face mask oxgen  Post-op Assessment:  Report given to PACU RN and Post -op Vital signs reviewed and stable  Post vital signs:  Reviewed and stable  Last Vitals:  Filed Vitals:   06/19/15 0710 06/19/15 0733  BP: 161/106 147/101  Pulse: 108   Temp: 36.8 C   Resp: 18     Complications: No apparent anesthesia complications

## 2015-06-20 DIAGNOSIS — C679 Malignant neoplasm of bladder, unspecified: Secondary | ICD-10-CM | POA: Diagnosis not present

## 2015-06-20 LAB — HEMOGLOBIN AND HEMATOCRIT, BLOOD
HCT: 40.9 % (ref 39.0–52.0)
HEMOGLOBIN: 13.6 g/dL (ref 13.0–17.0)

## 2015-06-20 LAB — BASIC METABOLIC PANEL
Anion gap: 10 (ref 5–15)
BUN: 23 mg/dL — AB (ref 6–20)
CALCIUM: 9.1 mg/dL (ref 8.9–10.3)
CO2: 26 mmol/L (ref 22–32)
CREATININE: 2 mg/dL — AB (ref 0.61–1.24)
Chloride: 102 mmol/L (ref 101–111)
GFR calc Af Amer: 39 mL/min — ABNORMAL LOW (ref >60)
GFR, EST NON AFRICAN AMERICAN: 34 mL/min — AB (ref >60)
Glucose, Bld: 143 mg/dL — ABNORMAL HIGH (ref 65–99)
POTASSIUM: 4.2 mmol/L (ref 3.5–5.1)
SODIUM: 138 mmol/L (ref 135–145)

## 2015-06-20 MED ORDER — SODIUM CHLORIDE 0.9 % IJ SOLN
10.0000 mL | INTRAMUSCULAR | Status: DC | PRN
  Administered 2015-06-20: 10 mL
  Filled 2015-06-20: qty 40

## 2015-06-20 MED ORDER — HEPARIN SOD (PORK) LOCK FLUSH 100 UNIT/ML IV SOLN
500.0000 [IU] | INTRAVENOUS | Status: AC | PRN
  Administered 2015-06-20: 500 [IU]

## 2015-06-20 MED ORDER — PREDNISONE 10 MG PO TABS: ORAL_TABLET | ORAL | Status: DC

## 2015-06-20 NOTE — Progress Notes (Signed)
1 Day Post-Op   Subjective: Patient reports no complaints. Foley catheter removed this morning without incident. He has voided 3x since catheter removal with thin, red urine without clots.   Objective: Vital signs in last 24 hours: Temp:  [97.9 F (36.6 C)-99 F (37.2 C)] 98 F (36.7 C) (12/30 [id number]) Pulse Rate:  [66-91] 66 (12/30 0614) Resp:  [14-22] 18 (12/30 0614) BP: (131-200)/(72-131) 154/84 mmHg (12/30 0614) SpO2:  [97 %-100 %] 98 % (12/30 0614)  Intake/Output from previous day: 12/29 0701 - 12/30 0700 In: 9870 [I.V.:2670] Out: 8400 [Urine:8400] Intake/Output this shift: Total I/O In: 240 [P.O.:240] Out: -   Physical Exam:  General:alert, cooperative and appears stated age GI: soft, non tender, normal bowel sounds, no palpable masses, no organomegaly, no inguinal hernia Male genitalia: no penile lesions or discharge Extremities: extremities normal, atraumatic, no cyanosis or edema  Lab Results:  Recent Labs  06/20/15 0530  HGB 13.6  HCT 40.9   BMET  Recent Labs  06/20/15 0530  NA 138  K 4.2  CL 102  CO2 26  GLUCOSE 143*  BUN 23*  CREATININE 2.00*  CALCIUM 9.1   No results for input(s): LABPT, INR in the last 72 hours. No results for input(s): LABURIN in the last 72 hours. No results found for this or any previous visit.  Studies/Results: No results found.  Assessment/Plan: 1 Day Post-Op Procedure(s) (LRB): TRANSURETHRAL RESECTION OF BLADDER TUMOR (TURBT) (N/A)  POD#1 TURBT. Recovering well.   [place]  Regular diet  Perform PVR after next void - if PVR < 100 mL plan to discharge patient to home     Acie Fredrickson 06/20/2015, 9:01 AM

## 2015-06-20 NOTE — Progress Notes (Signed)
Spoke with Dr. Frederico Hamman and she stated that she discussed with Dr. Jeffie Pollock that if post void residule check was less than 100 then patient was ok for discharge home. Unable to measure urine output as patient had a BM too and went in the toilet, but the PVR was 9ml. Will proceed with discharge plan per MD.   Patient is stable for discharge. Discharge instructions and medications have been reviewed with the patient and his son, and all questions answered.   Jesus [name] [facility] 06/20/2015 12:19 PM

## 2015-06-20 NOTE — Discharge Summary (Signed)
Physician Discharge Summary  Patient ID: Jesus [name] MRN: [id number] DOB/AGE: 12/22/1950 64 y.o.  Admit date: 06/19/2015 Discharge date: 06/20/2015  Admission Diagnoses: Bladder cancer  Discharge Diagnoses:  Active Problems:   Bladder cancer [facility])   Discharged Condition: good  Hospital Course:  64 yo male who is s/p TURBT. He did well post-operatively. His foley catheter was removed the morning of POD#1 and he passed his TOV following this. His diet was slowly advanced and at the time of discharge he was tolerating a regular diet, ambulating at his baseline, was voiding spontaneously after foley catheter removal, and pain was well controlled with oral narcotics. He was discharged to home on POD#1.  Consults: None  Significant Diagnostic Studies: none  Treatments: surgery: transurethral resection of bladder tumor  Discharge Exam: Blood pressure 154/84, pulse 66, temperature 98 F (36.7 C), temperature source Oral, resp. rate 18, height 5\' 9"  (1.753 m), weight 71.895 kg (158 lb 8 oz), SpO2 98 %. General appearance: alert, cooperative and appears stated age GI: soft, non-tender; bowel sounds normal; no masses,  no organomegaly Male genitalia: normal Extremities: extremities normal, atraumatic, no cyanosis or edema  Disposition: 01-Home or Self Care     Medication List    TAKE these medications        atezolizumab 1200 MG/20ML  Commonly known as:  TECENTRIQ  Inject into the vein.     HYDROcodone-acetaminophen 5-325 MG tablet  Commonly known as:  NORCO  Take 1 tablet by mouth every 6 (six) hours as needed for moderate pain.     LORazepam 0.5 MG tablet  Commonly known as:  ATIVAN  Take 0.5 mg by mouth every 6 (six) hours as needed for anxiety (nausea). Ordered by oncology     ondansetron 8 MG tablet  Commonly known as:  ZOFRAN  Take by mouth every 12 (twelve) hours as needed for nausea or vomiting.     prochlorperazine 10 MG tablet  Commonly known as:   COMPAZINE  Take 10 mg by mouth every 6 (six) hours as needed for nausea or vomiting. Ordered by oncology           Follow-up Information    Follow up with Malka So, MD On 06/27/2015.   Specialty:  Urology   Why:  [place] information:   [address] [place] [id number] [phone]       Signed: Acie Fredrickson 06/20/2015, 9:13 AM

## 2015-06-20 NOTE — Progress Notes (Signed)
Patient ID: Jesus [name], male   DOB: [birth year], 64 y.o.   MRN: [id number]  His Cr is 2 today and on review of his recent PET scan there was disease in the left pelvis and bladder wall with new left hydronephrosis.    I communicated with Dr. Whitney Muse and she is starting him on Tecentriq.  At this point it will be best to see what his response is and possibly consider palliative radiation should he have recurrent bleeding.  He has no left flank pain and I would be reluctant to consider a left perc particularly if he doesn't respond to the tecentriq as the tube would impact his quality of life without having much likelihood of improving survival.

## 2015-06-23 NOTE — Assessment & Plan Note (Addendum)
Relapsed Stage IV Urothelial carcinoma after receiving 6 cycles of first-line Carboplatin/Gemcitabine finishing on 10/10/2014 having received a complete metabolic remission on PET imagaing in May 2016, now with relapsed disease beginning therapy with Atezolizumab on 06/24/2015.  Oncology history updated.  He is S/P chemotherapy teaching and all questions are answered.  Pre-chemo labs are ordered.  Total bilirubin is 1.9.  No dosage adjustment for this bilirubin according to up-to-date.  Return in 7-10 days for follow-up with labs: CBC diff, CMET with another appointment for follow-up for day 1 of cycle #2.

## 2015-06-23 NOTE — Progress Notes (Signed)
Jesus Neighbors, Jesus [name] [place] [address]  Metastatic urothelial carcinoma [facility])  CURRENT THERAPY: To begin Atezolizumab today, 06/24/2015  INTERVAL HISTORY: Jesus [name] 65 y.o. male returns for followup of Relapsed Stage IV urothelial carcinoma.    Metastatic urothelial carcinoma ([place])   07/09/2014 Imaging  CT A/P Irregular and lobular bladder wall thickening suspicious for transitional cell carcinoma. Considerable associated pelvic and retroperitoneal adenopathy. 1.4 by 1.3 cm complex and likely enhancing lesion of the right hepatic lobe dome   07/16/2014 - 07/19/2014 Hospital Admission Hospital admission for TURP. Course complicated by ARF   [id number] Procedure TRANSURETHRAL RESECTION OF BLADDER TUMOR WITH GYRUS (TURBT-GYRUS) CYSTOSCOPY WITH PROSTATE ULTRASOUND AND BIOPSY.  Jesus [name]   08/08/2014 PET scan Irregular bladder wall thickening, compatible with known primary bladder neoplasm. Associated mild left hydroureteronephrosis. Extensive retroperitoneal nodal metastases. Associated pelvic implant, suspicious for peritoneal disease.2.3 metastasis liver   08/23/2014 - 10/10/2014 Chemotherapy Carboplatin and Gemzar day 1 and 8 given in the palliative setting   10/10/2014 Treatment Plan Change Changing chemotherapy to every other week and cycle length to 28 day cycle   10/10/2014 - 01/23/2015 Chemotherapy Carboplatin/Gemzar day 1 and 15 every 28 days in the palliative setting x 6 cycles.   11/04/2014 PET scan Interval complete metabolic response to therapy.  Residual, borderline left periaortic and right external iliac lymph nodes exhibit low level, nonspecific FDG uptake.   02/20/2015 Remission PET- No evidence of local bladder cancer recurrence or distant metastatic disease. Small RIGHT lung pulmonary nodules are stable.   06/06/2015 Progression PET- definitive evidence of recurrence of disease, with a large left-sided bladder wall mass, infiltrated into the surrounding  soft tissues, with extensive surrounding lymphadenopathy throughout the soft tissues of the pelvic floor, along the left pelvic   06/24/2015 -  Chemotherapy Atezolizumab   I personally reviewed and went over laboratory results with the patient.  The results are noted within this dictation.  Total bilirubin is elevated without known cause.  Other liver enzymes are WNL.  No dosage adjustments necessary for Atezolizumab.  I have provided him elementary education regarding the mechanism of action of this new regimen.  He is appreciative of the information.  He denies any complaints today.  Past Medical History  Diagnosis Date  . High cholesterol   . Depression     hx of   . Arthritis   . Bladder cancer ([place])   . Metastatic urothelial carcinoma ([place]) 07/16/2014    has Metastatic urothelial carcinoma ([facility]) and Iron deficiency anemia due to chronic blood loss on his problem list.     has No Known Allergies.  Current Outpatient Prescriptions on File Prior to Visit  Medication Sig Dispense Refill  . HYDROcodone-acetaminophen (NORCO) 5-325 MG tablet Take 1 tablet by mouth every 6 (six) hours as needed for moderate pain. (Patient not taking: Reported on 06/24/2015) 15 tablet 0  . LORazepam (ATIVAN) 0.5 MG tablet Take 0.5 mg by mouth every 6 (six) hours as needed for anxiety (nausea). Reported on 06/24/2015    . ondansetron (ZOFRAN) 8 MG tablet Take by mouth every 12 (twelve) hours as needed for nausea or vomiting. Reported on 06/24/2015    . prochlorperazine (COMPAZINE) 10 MG tablet Take 10 mg by mouth every 6 (six) hours as needed for nausea or vomiting. Reported on 06/24/2015     Current Facility-Administered Medications on File Prior to Visit  Medication Dose Route Frequency Provider Last Rate Last Dose  .  0.9 %  sodium chloride infusion   Intravenous Once Jesus Ranks, Jesus [name]      . Huey Bienenstock (TECENTRIQ) 1,200 mg in sodium chloride 0.9 % 250 mL chemo infusion  1,200 mg Intravenous Once Jesus Ranks, Jesus [name]      . heparin lock flush 100 unit/mL  500 Units Intracatheter Once PRN Jesus Ranks, Jesus [name]      . sodium chloride 0.9 % injection 10 mL  10 mL Intracatheter PRN Jesus Ranks, Jesus [name]        Past Surgical History  Procedure Laterality Date  . Knee surgery    . Tonsillectomy    . Transurethral resection of bladder tumor with gyrus (turbt-gyrus) N/A 07/16/2014    Procedure: TRANSURETHRAL RESECTION OF BLADDER TUMOR WITH GYRUS (TURBT-GYRUS);  Surgeon: Jesus So, Jesus [name];  Location: [facility];  Service: Urology;  Laterality: N/A;  . Cystoscopy with biopsy N/A 07/16/2014    Procedure: CYSTOSCOPY WITH PROSTATE ULTRASOUND AND BIOPSY;  Surgeon: Jesus So, Jesus [name];  Location: [facility];  Service: Urology;  Laterality: N/A;  . Transurethral resection of bladder tumor N/A 06/19/2015    Procedure: TRANSURETHRAL RESECTION OF BLADDER TUMOR (TURBT);  Surgeon: Jesus Seal, Jesus [name];  Location: [facility];  Service: Urology;  Laterality: N/A;    Denies any headaches, dizziness, double vision, fevers, chills, night sweats, nausea, vomiting, diarrhea, constipation, chest pain, heart palpitations, shortness of breath, blood in stool, black tarry stool, urinary pain, urinary burning, urinary frequency, hematuria.   PHYSICAL EXAMINATION  ECOG PERFORMANCE STATUS: 0 - Asymptomatic  Filed Vitals:   06/24/15 1016  BP: 148/96  Pulse: 94  Temp: 98.2 F (36.8 C)  Resp: 14    GENERAL:alert, no distress, well nourished, well developed, comfortable, cooperative, smiling and unaccompanied today SKIN: skin color, texture, turgor are normal, no rashes or significant lesions HEAD: Normocephalic, No masses, lesions, tenderness or abnormalities EYES: normal, PERRLA, EOMI, Conjunctiva are pink and non-injected EARS: External ears normal OROPHARYNX:lips, buccal mucosa, and tongue normal and mucous membranes are moist  NECK: supple, no adenopathy, thyroid normal size, non-tender, without nodularity, trachea midline LYMPH:  no  palpable lymphadenopathy, no hepatosplenomegaly BREAST:not examined LUNGS: clear to auscultation  HEART: regular rate & rhythm, no murmurs, no gallops, S1 normal and S2 normal ABDOMEN:abdomen soft, non-tender, normal bowel sounds and no masses or organomegaly BACK: Back symmetric, no curvature., No CVA tenderness EXTREMITIES:less then 2 second capillary refill, no joint deformities, effusion, or inflammation, no skin discoloration, no clubbing, no cyanosis  NEURO: alert & oriented x 3 with fluent speech, no focal motor/sensory deficits, gait normal   LABORATORY DATA: CBC    Component Value Date/Time   WBC 9.5 06/24/2015 1023   RBC 5.15 06/24/2015 1023   HGB 15.2 06/24/2015 1023   HCT 44.6 06/24/2015 1023   PLT 201 06/24/2015 1023   MCV 86.6 06/24/2015 1023   MCH 29.5 06/24/2015 1023   MCHC 34.1 06/24/2015 1023   RDW 12.6 06/24/2015 1023   LYMPHSABS 2.2 06/24/2015 1023   MONOABS 0.7 06/24/2015 1023   EOSABS 0.2 06/24/2015 1023   BASOSABS 0.0 06/24/2015 1023      Chemistry      Component Value Date/Time   NA 138 06/24/2015 1023   K 3.9 06/24/2015 1023   CL 99* 06/24/2015 1023   CO2 26 06/24/2015 1023   BUN 31* 06/24/2015 1023   CREATININE 1.99* 06/24/2015 1023      Component Value Date/Time   CALCIUM 9.2 06/24/2015 1023   ALKPHOS  63 06/24/2015 1023   AST 24 06/24/2015 1023   ALT 13* 06/24/2015 1023   BILITOT 1.9* 06/24/2015 1023        PENDING LABS:   RADIOGRAPHIC STUDIES:  Nm Pet Image Restag (ps) Skull Base To Thigh  06/06/2015  CLINICAL DATA:  Subsequent treatment strategy for stage IV bladder carcinoma. Restaging examination. EXAM: NUCLEAR MEDICINE PET SKULL BASE TO THIGH TECHNIQUE: 8.9 mCi F-18 FDG was injected intravenously. Full-ring PET imaging was performed from the skull base to thigh after the radiotracer. CT data was obtained and used for attenuation correction and anatomic localization. FASTING BLOOD GLUCOSE:  Value: 125 mg/dl COMPARISON:  PET-CT  02/20/2015. FINDINGS: NECK No hypermetabolic lymph nodes in the neck. CHEST No hypermetabolic mediastinal or hilar nodes. No suspicious pulmonary nodules on the CT scan. Heart size is normal. No consolidative airspace disease. No pleural effusions. Right internal jugular single-lumen porta cath with tip terminating at the superior cavoatrial junction. ABDOMEN/PELVIS Compared a prior examinations there is new mild left hydroureteronephrosis which extends all the way to the level of the left ureterovesicular junction. This is apparently related to obstruction from soft tissue at the level of the urinary bladder. Specifically, the urinary bladder wall is now markedly thickened and irregular, most evident along the left lateral surface, where the largest mass-like area measures up to 5.8 x 3.5 cm (image 188 of series 4), with direct infiltration into the adjacent perivesical fat. This part of the urinary bladder wall is hypermetabolic (SUVmax = [id number]). There is extensive adjacent nodularity throughout the perivesicle space, presumably multiple metastatic lymph nodes, all of which are hypermetabolic. Extensive adenopathy throughout the left hemipelvis, with the largest lymph nodes measuring up to 12 mm in short axis in the left external iliac nodal chain (SUVmax = 11.9). Left internal iliac adenopathy also noted measuring up to 14 mm in short axis all (image 165 of series 4), which is also hypermetabolic (SUVmax = 123XX123). Numerous other enlarged and hypermetabolic retroperitoneal lymph nodes are noted, measuring up to 14 mm in short axis in the left para-aortic nodal station in the immediate infrahilar region (SUVmax = 16.3). No abnormal hypermetabolic activity within the liver, pancreas, adrenal glands, or spleen. Atherosclerosis throughout the abdominal and pelvic vasculature, without definite aneurysm. No significant volume of ascites. No pneumoperitoneum. SKELETON No focal hypermetabolic activity to suggest skeletal  metastasis. IMPRESSION: 1. Today's study demonstrates definitive evidence of recurrence of disease, with a large left-sided bladder wall mass, infiltrated into the surrounding soft tissues, with extensive surrounding lymphadenopathy throughout the soft tissues of the pelvic floor, along the left pelvic sidewall and throughout the retroperitoneum on the left side extending to the level of the left renal hilum. At this time, this is associated with some mild left-sided hydroureteronephrosis from obstruction at the level of the left ureterovesicular junction. 2. No evidence of metastatic disease in the neck or thorax. 3. Additional incidental findings, as above. These results will be called to the ordering clinician or representative by the Radiologist Assistant, and communication documented in the PACS or zVision Dashboard. Electronically Signed   By: Vinnie Langton M.D.   On: 06/06/2015 12:42     PATHOLOGY:    ASSESSMENT AND PLAN:  Metastatic urothelial carcinoma Relapsed Stage IV Urothelial carcinoma after receiving 6 cycles of first-line Carboplatin/Gemcitabine finishing on 10/10/2014 having received a complete metabolic remission on PET imagaing in May 2016, now with relapsed disease beginning therapy with Atezolizumab on 06/24/2015.  Oncology history updated.  He is S/P chemotherapy teaching  and all questions are answered.  Pre-chemo labs are ordered.  Total bilirubin is 1.9.  No dosage adjustment for this bilirubin according to up-to-date.  Return in 7-10 days for follow-up with labs: CBC diff, CMET with another appointment for follow-up for day 1 of cycle #2.   THERAPY PLAN:  Start new therapy as outlined above.  All questions were answered. The patient knows to call the clinic with any problems, questions or concerns. We can certainly see the patient much sooner if necessary.  Patient and plan discussed with Dr. Ancil Linsey and she is in agreement with the aforementioned.   This  note is electronically signed by: Jesus Pane, PA-C 06/24/2015 11:22 AM

## 2015-06-24 ENCOUNTER — Ambulatory Visit (HOSPITAL_COMMUNITY): Payer: Commercial Managed Care - HMO | Admitting: Hematology & Oncology

## 2015-06-24 ENCOUNTER — Encounter (HOSPITAL_COMMUNITY): Payer: Commercial Managed Care - HMO | Attending: Hematology & Oncology

## 2015-06-24 ENCOUNTER — Encounter (HOSPITAL_COMMUNITY): Payer: Commercial Managed Care - HMO

## 2015-06-24 ENCOUNTER — Ambulatory Visit (HOSPITAL_COMMUNITY): Payer: Commercial Managed Care - HMO | Admitting: Oncology

## 2015-06-24 ENCOUNTER — Encounter (HOSPITAL_COMMUNITY): Payer: Self-pay | Admitting: Oncology

## 2015-06-24 ENCOUNTER — Encounter (HOSPITAL_COMMUNITY): Payer: Commercial Managed Care - HMO | Attending: Oncology | Admitting: Oncology

## 2015-06-24 VITALS — BP 148/95 | HR 77 | Temp 98.1°F | Resp 18

## 2015-06-24 VITALS — BP 148/96 | HR 94 | Temp 98.2°F | Resp 14 | Wt 161.7 lb

## 2015-06-24 DIAGNOSIS — R17 Unspecified jaundice: Secondary | ICD-10-CM | POA: Diagnosis not present

## 2015-06-24 DIAGNOSIS — C7910 Secondary malignant neoplasm of unspecified urinary organs: Secondary | ICD-10-CM

## 2015-06-24 DIAGNOSIS — C679 Malignant neoplasm of bladder, unspecified: Secondary | ICD-10-CM

## 2015-06-24 DIAGNOSIS — C649 Malignant neoplasm of unspecified kidney, except renal pelvis: Secondary | ICD-10-CM | POA: Diagnosis not present

## 2015-06-24 DIAGNOSIS — Z5112 Encounter for antineoplastic immunotherapy: Secondary | ICD-10-CM | POA: Diagnosis not present

## 2015-06-24 LAB — CBC WITH DIFFERENTIAL/PLATELET
BASOS ABS: 0 10*3/uL (ref 0.0–0.1)
Basophils Relative: 0 %
EOS ABS: 0.2 10*3/uL (ref 0.0–0.7)
EOS PCT: 2 %
HCT: 44.6 % (ref 39.0–52.0)
Hemoglobin: 15.2 g/dL (ref 13.0–17.0)
Lymphocytes Relative: 23 %
Lymphs Abs: 2.2 10*3/uL (ref 0.7–4.0)
MCH: 29.5 pg (ref 26.0–34.0)
MCHC: 34.1 g/dL (ref 30.0–36.0)
MCV: 86.6 fL (ref 78.0–100.0)
Monocytes Absolute: 0.7 10*3/uL (ref 0.1–1.0)
Monocytes Relative: 7 %
Neutro Abs: 6.4 10*3/uL (ref 1.7–7.7)
Neutrophils Relative %: 68 %
PLATELETS: 201 10*3/uL (ref 150–400)
RBC: 5.15 MIL/uL (ref 4.22–5.81)
RDW: 12.6 % (ref 11.5–15.5)
WBC: 9.5 10*3/uL (ref 4.0–10.5)

## 2015-06-24 LAB — COMPREHENSIVE METABOLIC PANEL
ALT: 13 U/L — AB (ref 17–63)
AST: 24 U/L (ref 15–41)
Albumin: 4.1 g/dL (ref 3.5–5.0)
Alkaline Phosphatase: 63 U/L (ref 38–126)
Anion gap: 13 (ref 5–15)
BUN: 31 mg/dL — AB (ref 6–20)
CHLORIDE: 99 mmol/L — AB (ref 101–111)
CO2: 26 mmol/L (ref 22–32)
CREATININE: 1.99 mg/dL — AB (ref 0.61–1.24)
Calcium: 9.2 mg/dL (ref 8.9–10.3)
GFR calc non Af Amer: 34 mL/min — ABNORMAL LOW (ref >60)
GFR, EST AFRICAN AMERICAN: 40 mL/min — AB (ref >60)
Glucose, Bld: 111 mg/dL — ABNORMAL HIGH (ref 65–99)
Potassium: 3.9 mmol/L (ref 3.5–5.1)
SODIUM: 138 mmol/L (ref 135–145)
Total Bilirubin: 1.9 mg/dL — ABNORMAL HIGH (ref 0.3–1.2)
Total Protein: 7.5 g/dL (ref 6.5–8.1)

## 2015-06-24 LAB — TSH: TSH: 0.732 u[IU]/mL (ref 0.350–4.500)

## 2015-06-24 MED ORDER — HEPARIN SOD (PORK) LOCK FLUSH 100 UNIT/ML IV SOLN
500.0000 [IU] | Freq: Once | INTRAVENOUS | Status: AC | PRN
  Administered 2015-06-24: 500 [IU]
  Filled 2015-06-24: qty 5

## 2015-06-24 MED ORDER — SODIUM CHLORIDE 0.9 % IJ SOLN
10.0000 mL | INTRAMUSCULAR | Status: DC | PRN
  Administered 2015-06-24: 10 mL
  Filled 2015-06-24: qty 10

## 2015-06-24 MED ORDER — SODIUM CHLORIDE 0.9 % IV SOLN
1200.0000 mg | Freq: Once | INTRAVENOUS | Status: AC
  Administered 2015-06-24: 1200 mg via INTRAVENOUS
  Filled 2015-06-24: qty 20

## 2015-06-24 MED ORDER — SODIUM CHLORIDE 0.9 % IV SOLN
Freq: Once | INTRAVENOUS | Status: AC
  Administered 2015-06-24: 12:00:00 via INTRAVENOUS

## 2015-06-24 NOTE — Progress Notes (Signed)
Teaching for Tecentriq done. Patient instructed that a Prednisone prescription was waiting for him @ his pharmacy but he was not to take it unless he got diarrhea. I also asked him to call me if he had to start the Prednisone. He said he would. I have also relayed this information to his son [name]. [name] will remind his father about letting us know if he has diarrhea.

## 2015-06-24 NOTE — Patient Instructions (Signed)
[place] at [facility] Discharge Instructions  RECOMMENDATIONS MADE BY THE CONSULTANT AND ANY TEST RESULTS WILL BE SENT TO YOUR REFERRING PHYSICIAN.  Exam and discussion by Robynn Pane, PA-C This treatment will be given every 3 weeks. Report fevers, uncontrolled nausea, vomiting or other concerns.  Office visit in 7 - 10 days to see how you're doing. Office visit and chemotherapy in 3 weeks.  Thank you for choosing [place] at [facility] to provide your oncology and hematology care.  To afford each patient quality time with our provider, please arrive at least 15 minutes before your scheduled appointment time.    You need to re-schedule your appointment should you arrive 10 or more minutes late.  We strive to give you quality time with our providers, and arriving late affects you and other patients whose appointments are after yours.  Also, if you no show three or more times for appointments you may be dismissed from the clinic at the providers discretion.     Again, thank you for choosing [facility].  Our hope is that these requests will decrease the amount of time that you wait before being seen by our physicians.       _____________________________________________________________  Should you have questions after your visit to [facility], please contact our office at [phone] between the hours of 8:30 a.m. and 4:30 p.m.  Voicemails left after 4:30 p.m. will not be returned until the following business day.  For prescription refill requests, have your pharmacy contact our office.

## 2015-06-24 NOTE — Progress Notes (Signed)
After reviewing labs, okay to treat today per T. Kefalas, PA and Dr. Whitney Muse.   Tolerated infusion w/o adverse reaction.  A&Ox4, in no distress.  VSS.  Discharged ambulatory.

## 2015-06-24 NOTE — Patient Instructions (Addendum)
[facility] Discharge Instructions for Patients Receiving Chemotherapy  Today you received the following chemotherapy agents:  Tecentriq   If you develop nausea and vomiting, or diarrhea that is not controlled by your medication, call the clinic.  The clinic phone number is [phone]. Office hours are Monday-Friday 8:30am-5:00pm.  BELOW ARE SYMPTOMS THAT SHOULD BE REPORTED IMMEDIATELY:  *FEVER GREATER THAN 101.0 F  *CHILLS WITH OR WITHOUT FEVER  NAUSEA AND VOMITING THAT IS NOT CONTROLLED WITH YOUR NAUSEA MEDICATION  *UNUSUAL SHORTNESS OF BREATH  *UNUSUAL BRUISING OR BLEEDING  TENDERNESS IN MOUTH AND THROAT WITH OR WITHOUT PRESENCE OF ULCERS  *URINARY PROBLEMS  *BOWEL PROBLEMS  UNUSUAL RASH Items with * indicate a potential emergency and should be followed up as soon as possible. If you have an emergency after office hours please contact your primary care physician or go to the nearest emergency department.  Please call the clinic during office hours if you have any questions or concerns.   You may also contact the Patient Navigator at [phone] should you have any questions or need assistance in obtaining follow up care.

## 2015-06-25 ENCOUNTER — Telehealth (HOSPITAL_COMMUNITY): Payer: Self-pay | Admitting: *Deleted

## 2015-06-25 NOTE — Telephone Encounter (Signed)
Called pt for 24 hour follow-up after receiving Tecentriq first time yesterday.  Call directed to pt's voicemail - left message for pt to call the clinic with any questions or concerns.

## 2015-06-27 ENCOUNTER — Ambulatory Visit (INDEPENDENT_AMBULATORY_CARE_PROVIDER_SITE_OTHER): Payer: Commercial Managed Care - HMO | Admitting: Urology

## 2015-06-27 DIAGNOSIS — C772 Secondary and unspecified malignant neoplasm of intra-abdominal lymph nodes: Secondary | ICD-10-CM

## 2015-06-27 DIAGNOSIS — C672 Malignant neoplasm of lateral wall of bladder: Secondary | ICD-10-CM

## 2015-06-27 DIAGNOSIS — R310 Gross hematuria: Secondary | ICD-10-CM

## 2015-06-29 ENCOUNTER — Emergency Department (HOSPITAL_COMMUNITY): Payer: Commercial Managed Care - HMO

## 2015-06-29 ENCOUNTER — Inpatient Hospital Stay (HOSPITAL_COMMUNITY)
Admission: EM | Admit: 2015-06-29 | Discharge: 2015-07-02 | DRG: 070 | Disposition: A | Discharge status: Nursing Home (Includes ICF) | Payer: Commercial Managed Care - HMO | Attending: Internal Medicine | Admitting: Internal Medicine

## 2015-06-29 ENCOUNTER — Encounter (HOSPITAL_COMMUNITY): Payer: Self-pay | Admitting: *Deleted

## 2015-06-29 ENCOUNTER — Other Ambulatory Visit: Payer: Self-pay

## 2015-06-29 DIAGNOSIS — Z809 Family history of malignant neoplasm, unspecified: Secondary | ICD-10-CM | POA: Diagnosis not present

## 2015-06-29 DIAGNOSIS — G9340 Encephalopathy, unspecified: Secondary | ICD-10-CM | POA: Diagnosis not present

## 2015-06-29 DIAGNOSIS — R462 Strange and inexplicable behavior: Secondary | ICD-10-CM | POA: Diagnosis present

## 2015-06-29 DIAGNOSIS — N183 Chronic kidney disease, stage 3 unspecified: Secondary | ICD-10-CM | POA: Diagnosis present

## 2015-06-29 DIAGNOSIS — N179 Acute kidney failure, unspecified: Secondary | ICD-10-CM | POA: Diagnosis present

## 2015-06-29 DIAGNOSIS — Z66 Do not resuscitate: Secondary | ICD-10-CM | POA: Diagnosis present

## 2015-06-29 DIAGNOSIS — R4182 Altered mental status, unspecified: Secondary | ICD-10-CM | POA: Insufficient documentation

## 2015-06-29 DIAGNOSIS — C679 Malignant neoplasm of bladder, unspecified: Secondary | ICD-10-CM | POA: Diagnosis not present

## 2015-06-29 DIAGNOSIS — Z9221 Personal history of antineoplastic chemotherapy: Secondary | ICD-10-CM

## 2015-06-29 DIAGNOSIS — F418 Other specified anxiety disorders: Secondary | ICD-10-CM | POA: Diagnosis not present

## 2015-06-29 DIAGNOSIS — M1990 Unspecified osteoarthritis, unspecified site: Secondary | ICD-10-CM | POA: Diagnosis present

## 2015-06-29 DIAGNOSIS — E876 Hypokalemia: Secondary | ICD-10-CM | POA: Diagnosis present

## 2015-06-29 DIAGNOSIS — E785 Hyperlipidemia, unspecified: Secondary | ICD-10-CM | POA: Diagnosis present

## 2015-06-29 DIAGNOSIS — G454 Transient global amnesia: Secondary | ICD-10-CM | POA: Diagnosis present

## 2015-06-29 DIAGNOSIS — C7910 Secondary malignant neoplasm of unspecified urinary organs: Secondary | ICD-10-CM | POA: Diagnosis present

## 2015-06-29 DIAGNOSIS — Z823 Family history of stroke: Secondary | ICD-10-CM

## 2015-06-29 DIAGNOSIS — E7800 Pure hypercholesterolemia, unspecified: Secondary | ICD-10-CM | POA: Diagnosis present

## 2015-06-29 LAB — COMPREHENSIVE METABOLIC PANEL
ALBUMIN: 3.7 g/dL (ref 3.5–5.0)
ALK PHOS: 56 U/L (ref 38–126)
ALT: 16 U/L — AB (ref 17–63)
ANION GAP: 13 (ref 5–15)
AST: 29 U/L (ref 15–41)
BILIRUBIN TOTAL: 1 mg/dL (ref 0.3–1.2)
BUN: 28 mg/dL — ABNORMAL HIGH (ref 6–20)
CALCIUM: 9 mg/dL (ref 8.9–10.3)
CO2: 25 mmol/L (ref 22–32)
CREATININE: 2.14 mg/dL — AB (ref 0.61–1.24)
Chloride: 99 mmol/L — ABNORMAL LOW (ref 101–111)
GFR calc non Af Amer: 31 mL/min — ABNORMAL LOW (ref >60)
GFR, EST AFRICAN AMERICAN: 36 mL/min — AB (ref >60)
GLUCOSE: 222 mg/dL — AB (ref 65–99)
Potassium: 3.6 mmol/L (ref 3.5–5.1)
Sodium: 137 mmol/L (ref 135–145)
TOTAL PROTEIN: 6.7 g/dL (ref 6.5–8.1)

## 2015-06-29 LAB — I-STAT TROPONIN, ED: Troponin i, poc: 0 ng/mL (ref 0.00–0.08)

## 2015-06-29 LAB — CBC
HCT: 39.9 % (ref 39.0–52.0)
HEMOGLOBIN: 13.5 g/dL (ref 13.0–17.0)
MCH: 29.2 pg (ref 26.0–34.0)
MCHC: 33.8 g/dL (ref 30.0–36.0)
MCV: 86.4 fL (ref 78.0–100.0)
PLATELETS: 179 10*3/uL (ref 150–400)
RBC: 4.62 MIL/uL (ref 4.22–5.81)
RDW: 12.8 % (ref 11.5–15.5)
WBC: 7.3 10*3/uL (ref 4.0–10.5)

## 2015-06-29 LAB — BLOOD GAS, ARTERIAL
Acid-Base Excess: 1.5 mmol/L (ref 0.0–2.0)
Bicarbonate: 26.2 mEq/L — ABNORMAL HIGH (ref 20.0–24.0)
DRAWN BY: 23534
FIO2: 0.21
O2 CONTENT: 21 L/min
O2 SAT: 96.9 %
pCO2 arterial: 34.5 mmHg — ABNORMAL LOW (ref 35.0–45.0)
pH, Arterial: 7.471 — ABNORMAL HIGH (ref 7.350–7.450)
pO2, Arterial: 88.8 mmHg (ref 80.0–100.0)

## 2015-06-29 LAB — RAPID URINE DRUG SCREEN, HOSP PERFORMED
AMPHETAMINES: NOT DETECTED
Barbiturates: NOT DETECTED
Benzodiazepines: NOT DETECTED
Cocaine: NOT DETECTED
OPIATES: NOT DETECTED
Tetrahydrocannabinol: NOT DETECTED

## 2015-06-29 LAB — I-STAT CHEM 8, ED
BUN: 25 mg/dL — ABNORMAL HIGH (ref 6–20)
CALCIUM ION: 1.04 mmol/L — AB (ref 1.13–1.30)
Chloride: 100 mmol/L — ABNORMAL LOW (ref 101–111)
Creatinine, Ser: 1.7 mg/dL — ABNORMAL HIGH (ref 0.61–1.24)
Glucose, Bld: 82 mg/dL (ref 65–99)
HCT: 42 % (ref 39.0–52.0)
Hemoglobin: 14.3 g/dL (ref 13.0–17.0)
Potassium: 3.4 mmol/L — ABNORMAL LOW (ref 3.5–5.1)
SODIUM: 137 mmol/L (ref 135–145)
TCO2: 25 mmol/L (ref 0–100)

## 2015-06-29 LAB — URINALYSIS, ROUTINE W REFLEX MICROSCOPIC
GLUCOSE, UA: NEGATIVE mg/dL
KETONES UR: NEGATIVE mg/dL
Leukocytes, UA: NEGATIVE
Nitrite: NEGATIVE
PROTEIN: 100 mg/dL — AB
Specific Gravity, Urine: 1.025 (ref 1.005–1.030)
pH: 6 (ref 5.0–8.0)

## 2015-06-29 LAB — URINE MICROSCOPIC-ADD ON
Bacteria, UA: NONE SEEN
Squamous Epithelial / LPF: NONE SEEN
WBC UA: NONE SEEN WBC/hpf (ref 0–5)

## 2015-06-29 LAB — AMMONIA: Ammonia: 19 umol/L (ref 9–35)

## 2015-06-29 LAB — CARBOXYHEMOGLOBIN
CARBOXYHEMOGLOBIN: 1.2 % (ref 0.5–1.5)
METHEMOGLOBIN: 0.6 % (ref 0.0–1.5)
O2 Saturation: 96.9 %
TOTAL HEMOGLOBIN: 13.9 g/dL (ref 13.5–18.0)
Total oxygen content: 34.5 mL/dL — ABNORMAL HIGH (ref 15.0–23.0)

## 2015-06-29 LAB — I-STAT CG4 LACTIC ACID, ED
LACTIC ACID, VENOUS: 2.49 mmol/L — AB (ref 0.5–2.0)
LACTIC ACID, VENOUS: 0.91 mmol/L (ref 0.5–2.0)

## 2015-06-29 LAB — ETHANOL: Alcohol, Ethyl (B): <5 mg/dL (ref <5)

## 2015-06-29 MED ORDER — SODIUM CHLORIDE 0.9 % IV SOLN
INTRAVENOUS | Status: DC
  Administered 2015-06-29 – 2015-07-02 (×5): via INTRAVENOUS

## 2015-06-29 MED ORDER — ENOXAPARIN SODIUM 40 MG/0.4ML ~~LOC~~ SOLN
40.0000 mg | SUBCUTANEOUS | Status: DC
  Administered 2015-06-29 – 2015-07-01 (×3): 40 mg via SUBCUTANEOUS
  Filled 2015-06-29 (×3): qty 0.4

## 2015-06-29 MED ORDER — ONDANSETRON HCL 4 MG/2ML IJ SOLN: 4.0000 mg | Freq: Four times a day (QID) | INTRAMUSCULAR | Status: DC | PRN

## 2015-06-29 MED ORDER — ONDANSETRON HCL 4 MG PO TABS: 4.0000 mg | ORAL_TABLET | Freq: Four times a day (QID) | ORAL | Status: DC | PRN

## 2015-06-29 NOTE — ED Provider Notes (Signed)
CSN: [id number]     Arrival date & time 06/29/15  1038 History   First MD Initiated Contact with Patient 06/29/15 1109     Chief Complaint  Patient presents with  . Altered Mental Status     (Consider location/radiation/quality/duration/timing/severity/associated sxs/prior Treatment) HPI Comments: 65 year old male with history of bladder cancer recently started on immunotherapy presents for altered mental status. The patient intermittently is able to answer questions for himself but is not able to give any history about the events of today. He is able to report that he is receiving immunotherapy and that he has bladder cancer. His sister and brother-in-law are at bedside and said that they both saw the patient in the last few days and that he was mentating normally at that time. His sister does report a history of psychosis after the patient's divorce and after the death of his mother but these were several years ago there has not been any known acute event that should cause increased stress at this time. The patient reportedly was found about a half mile from his home carrying a basket of laundry in the middle of the road. He was found by a firefighter and then was brought to the emergency department as he was confused.   Past Medical History  Diagnosis Date  . High cholesterol   . Depression     hx of   . Arthritis   . Bladder cancer ([place])   . Metastatic urothelial carcinoma ([place]) 07/16/2014   Past Surgical History  Procedure Laterality Date  . Knee surgery    . Tonsillectomy    . Transurethral resection of bladder tumor with gyrus (turbt-gyrus) N/A 07/16/2014    Procedure: TRANSURETHRAL RESECTION OF BLADDER TUMOR WITH GYRUS (TURBT-GYRUS);  Surgeon: Malka So, MD;  Location: [facility];  Service: Urology;  Laterality: N/A;  . Cystoscopy with biopsy N/A 07/16/2014    Procedure: CYSTOSCOPY WITH PROSTATE ULTRASOUND AND BIOPSY;  Surgeon: Malka So, MD;  Location: [facility];  Service: Urology;   Laterality: N/A;  . Transurethral resection of bladder tumor N/A 06/19/2015    Procedure: TRANSURETHRAL RESECTION OF BLADDER TUMOR (TURBT);  Surgeon: Irine Seal, MD;  Location: [facility];  Service: Urology;  Laterality: N/A;   Family History  Problem Relation Age of Onset  . Cancer Mother   . Stroke Father   . Cancer Sister    Social History  Substance Use Topics  . Smoking status: Never Smoker   . Smokeless tobacco: Never Used  . Alcohol Use: No    Review of Systems  Unable to perform ROS: Mental status change  Constitutional: Negative for fever.  Respiratory: Negative for cough and shortness of breath.   Cardiovascular: Negative for chest pain.  Musculoskeletal: Negative for back pain.  Neurological: Negative for headaches.      Allergies  Review of patient's allergies indicates no known allergies.  Home Medications   Prior to Admission medications   Medication Sig Start Date End Date Taking? Authorizing Provider  HYDROcodone-acetaminophen (NORCO) 5-325 MG tablet Take 1 tablet by mouth every 6 (six) hours as needed for moderate pain. Patient not taking: Reported on 06/24/2015 06/19/15   Irine Seal, MD  LORazepam (ATIVAN) 0.5 MG tablet Take 0.5 mg by mouth every 6 (six) hours as needed for anxiety (nausea). Reported on 06/24/2015    Historical Provider, MD  ondansetron (ZOFRAN) 8 MG tablet Take by mouth every 12 (twelve) hours as needed for nausea or vomiting. Reported on 06/24/2015  Historical Provider, MD  predniSONE (DELTASONE) 10 MG tablet Take 7 tablets (70mg  total) daily @ the onset of loose stools/diarrhea & contact [place]. Patient not taking: Reported on 06/24/2015 06/20/15   Manon Hilding Kefalas, PA-C  prochlorperazine (COMPAZINE) 10 MG tablet Take 10 mg by mouth every 6 (six) hours as needed for nausea or vomiting. Reported on 06/24/2015    Historical Provider, MD   BP 137/82 mmHg  Pulse 74  Temp(Src) 97.8 F (36.6 C) (Oral)  Resp 20  SpO2 97% Physical Exam   Constitutional: No distress.  HENT:  Head: Normocephalic and atraumatic.  Right Ear: External ear normal. No hemotympanum.  Left Ear: External ear normal. No hemotympanum.  Nose: No epistaxis.  Mouth/Throat: Oropharynx is clear and moist. No oral lesions. No lacerations. No oropharyngeal exudate.  Eyes: Conjunctivae and EOM are normal. Pupils are equal, round, and reactive to light.  Neck: Normal range of motion and full passive range of motion without pain. Neck supple. No spinous process tenderness and no muscular tenderness present. No rigidity.  Cardiovascular: Normal rate, regular rhythm, normal heart sounds and intact distal pulses.   No murmur heard. Pulmonary/Chest: Effort normal. No respiratory distress. He has no wheezes. He has no rales.  Abdominal: Soft. He exhibits no distension. There is no tenderness. There is no rebound.  Neurological: He is alert. No cranial nerve deficit. He exhibits normal muscle tone.  Able to give his name.  Said he knew where he was and the year but did not give actual answer to either question.  Patient moving all extremities spontaneously.  Not following commands.  No focal deficit noted.  Skin: Skin is warm and dry. No rash noted. He is not diaphoretic.  Vitals reviewed.   ED Course  Procedures (including critical care time) Labs Review Labs Reviewed  COMPREHENSIVE METABOLIC PANEL - Abnormal; Notable for the following:    Chloride 99 (*)    Glucose, Bld 222 (*)    BUN 28 (*)    Creatinine, Ser 2.14 (*)    ALT 16 (*)    GFR calc non Af Amer 31 (*)    GFR calc Af Amer 36 (*)    All other components within normal limits  URINALYSIS, ROUTINE W REFLEX MICROSCOPIC (NOT AT [facility]) - Abnormal; Notable for the following:    Hgb urine dipstick LARGE (*)    Bilirubin Urine SMALL (*)    Protein, ur 100 (*)    All other components within normal limits  BLOOD GAS, ARTERIAL - Abnormal; Notable for the following:    pH, Arterial 7.471 (*)    pCO2  arterial 34.5 (*)    Bicarbonate 26.2 (*)    All other components within normal limits  CARBOXYHEMOGLOBIN - Abnormal; Notable for the following:    Total oxygen content 34.5 (*)    All other components within normal limits  I-STAT CG4 LACTIC ACID, ED - Abnormal; Notable for the following:    Lactic Acid, Venous 2.49 (*)    All other components within normal limits  I-STAT CHEM 8, ED - Abnormal; Notable for the following:    Potassium 3.4 (*)    Chloride 100 (*)    BUN 25 (*)    Creatinine, Ser 1.70 (*)    Calcium, Ion 1.04 (*)    All other components within normal limits  CBC  URINE RAPID DRUG SCREEN, HOSP PERFORMED  AMMONIA  ETHANOL  URINE MICROSCOPIC-ADD ON  I-STAT TROPOININ, ED  I-STAT CG4 LACTIC ACID,  ED    Imaging Review Dg Chest 1 View  06/29/2015  CLINICAL DATA:  Altered mental status.  History of bladder cancer. EXAM: CHEST 1 VIEW COMPARISON:  None. FINDINGS: Study is hypoinspiratory with crowding of the perihilar bronchovascular markings. Pulmonary arteries somewhat prominent in appearance raising the possibility of a chronic underlying pulmonary artery hypertension. Lungs are otherwise clear. No evidence of pneumonia. No pleural effusion seen. No pneumothorax. Heart size is normal. Overall cardiomediastinal silhouette is within normal limits in size and configuration. Right chest wall Port-A-Cath appears well positioned with tip projected over the lower SVC. No acute osseous abnormality seen. IMPRESSION: Lungs are clear and there is no evidence of acute cardiopulmonary abnormality. Questionable pulmonary artery hypertension. Hypoinspiratory changes as described above. Electronically Signed   By: Franki Cabot M.D.   On: 06/29/2015 13:10   Ct Head Wo Contrast  06/29/2015  CLINICAL DATA:  Altered mental status today. History of bladder carcinoma. EXAM: CT HEAD WITHOUT CONTRAST TECHNIQUE: Contiguous axial images were obtained from the base of the skull through the vertex without  intravenous contrast. COMPARISON:  Brain MRI 08/12/2014. FINDINGS: The brain is atrophic. No evidence of acute abnormality including hemorrhage, infarct, mass lesion, mass effect, midline shift or abnormal extra-axial fluid collection is identified. There is no hydrocephalus or pneumocephalus. Imaged paranasal sinuses and mastoid air cells are clear. IMPRESSION: No acute abnormality. Cortical atrophy. Electronically Signed   By: Inge Rise M.D.   On: 06/29/2015 13:20   I have personally reviewed and evaluated these images and lab results as part of my medical decision-making.   EKG Interpretation   Date/Time:  Sunday June 29 2015 10:40:42 EST Ventricular Rate:  83 PR Interval:  160 QRS Duration: 95 QT Interval:  375 QTC Calculation: 441 R Axis:   50 Text Interpretation:  Sinus rhythm No previous ECGs available Confirmed by  NGUYEN, EMILY ([id number]) on 06/29/2015 4:28:52 PM      MDM  Patient was seen and evaluated in stable condition. Patient obviously confused during evaluation. Unable to identify family members. Not answering questions or following commands. These symptoms did improve in the emergency Department although the patient did not remember meeting me and cannot remember the events that occurred prior to his presentation. Mild elevation in his creatinine although not far from his baseline. Other laboratory results unremarkable for patient. Normal carboxyhemoglobin and pneumonia. CT head and chest x-ray without acute process. No finding to explain the patient's change in mental status. His family did go back to his home and found water all over the floor. They also brought back prescription medications for Zofran and Phenergan that been filled last February. His brother-in-law said that the house seemed a mess.  Case was discussed with Dr. Anastasio Champion from internal medicine who agreed with admission.  Patient was admitted under his care for continued evaluation. Final diagnoses:   Altered mental status, unspecified altered mental status type    1. Altered mental status    Harvel Quale, MD 06/29/15 [phone]

## 2015-06-29 NOTE — ED Notes (Signed)
Pt was found walking outside of fire department. Pt was brought inside the fire department and assessed. Pt is altered at this time but is alert. Pt denies any pain.

## 2015-06-29 NOTE — H&P (Signed)
[facility] History and Physical  SHAHIEM MCMURRY [id number] DOB: [birth year] DOA: 06/29/2015  Referring physician: ER PCP: Wende Neighbors, MD   Chief Complaint: Altered mental status  HPI: Jesus [name] is a 65 y.o. male  This is a 65 year old man who has a history of bladder cancer and had recently been started on immunotherapy presents with acute altered mental status with what appears to be global amnesia. He can remember events of yesterday but this morning he was found approximately half a mile from his home carrying a basket of laundry in the middle of the road. He has no recollection of events. He was found by a firefighter and was brought to the emergency room. According to his sister, he was last seen his usual self a few days ago. The patient does remember correctly some of the events yesterday. There is no apparent limb weakness or problems with speech. There are no visual problems. He is now being admitted for further investigation.   Review of Systems:  Due to the patient's altered mental status,, cannot obtain review of systems.  Past Medical History  Diagnosis Date  . High cholesterol   . Depression     hx of   . Arthritis   . Bladder cancer ([place])   . Metastatic urothelial carcinoma ([place]) 07/16/2014   Past Surgical History  Procedure Laterality Date  . Knee surgery    . Tonsillectomy    . Transurethral resection of bladder tumor with gyrus (turbt-gyrus) N/A 07/16/2014    Procedure: TRANSURETHRAL RESECTION OF BLADDER TUMOR WITH GYRUS (TURBT-GYRUS);  Surgeon: Malka So, MD;  Location: [facility];  Service: Urology;  Laterality: N/A;  . Cystoscopy with biopsy N/A 07/16/2014    Procedure: CYSTOSCOPY WITH PROSTATE ULTRASOUND AND BIOPSY;  Surgeon: Malka So, MD;  Location: [facility];  Service: Urology;  Laterality: N/A;  . Transurethral resection of bladder tumor N/A 06/19/2015    Procedure: TRANSURETHRAL RESECTION OF BLADDER TUMOR (TURBT);  Surgeon: Irine Seal, MD;   Location: [facility];  Service: Urology;  Laterality: N/A;   Social History:  reports that he has never smoked. He has never used smokeless tobacco. He reports that he does not drink alcohol or use illicit drugs.  No Known Allergies  Family History  Problem Relation Age of Onset  . Cancer Mother   . Stroke Father   . Cancer Sister     Prior to Admission medications   Medication Sig Start Date End Date Taking? Authorizing Provider  HYDROcodone-acetaminophen (NORCO) 5-325 MG tablet Take 1 tablet by mouth every 6 (six) hours as needed for moderate pain. Patient not taking: Reported on 06/24/2015 06/19/15   Irine Seal, MD  LORazepam (ATIVAN) 0.5 MG tablet Take 0.5 mg by mouth every 6 (six) hours as needed for anxiety (nausea). Reported on 06/24/2015    Historical Provider, MD  ondansetron (ZOFRAN) 8 MG tablet Take by mouth every 12 (twelve) hours as needed for nausea or vomiting. Reported on 06/24/2015    Historical Provider, MD  predniSONE (DELTASONE) 10 MG tablet Take 7 tablets (70mg  total) daily @ the onset of loose stools/diarrhea & contact [place]. Patient not taking: Reported on 06/24/2015 06/20/15   Manon Hilding Kefalas, PA-C  prochlorperazine (COMPAZINE) 10 MG tablet Take 10 mg by mouth every 6 (six) hours as needed for nausea or vomiting. Reported on 06/24/2015    Historical Provider, MD   Physical Exam: Filed Vitals:   06/29/15 1500 06/29/15 1523 06/29/15 1600 06/29/15 1649  BP: 114/86  137/82 152/92  Pulse: 74   76  Temp:  97.8 F (36.6 C)  98.3 F (36.8 C)  TempSrc:    Oral  Resp: 22  20 20   SpO2: 97%  97% 100%    Wt Readings from Last 3 Encounters:  06/24/15 73.347 kg (161 lb 11.2 oz)  06/19/15 71.895 kg (158 lb 8 oz)  06/11/15 78.336 kg (172 lb 11.2 oz)    General:  Appears calm and comfortable. He appears alert. He does seem to know that he is in the hospital, the date but then seems to be distracted and not respond further to my questions. Eyes: PERRL, normal lids, irises &  conjunctiva ENT: grossly normal hearing, lips & tongue Neck: no LAD, masses or thyromegaly Cardiovascular: RRR, no m/r/g. No LE edema. Telemetry: SR, no arrhythmias  Respiratory: CTA bilaterally, no w/r/r. Normal respiratory effort. Abdomen: soft, ntnd Skin: no rash or induration seen on limited exam Musculoskeletal: grossly normal tone BUE/BLE Psychiatric: grossly normal mood and affect, speech fluent and appropriate Neurologic: grossly non-focal.          Labs on Admission:  Basic Metabolic Panel:  Recent Labs Lab 06/24/15 1023 06/29/15 1055 06/29/15 1515  NA 138 137 137  K 3.9 3.6 3.4*  CL 99* 99* 100*  CO2 26 25  --   GLUCOSE 111* 222* 82  BUN 31* 28* 25*  CREATININE 1.99* 2.14* 1.70*  CALCIUM 9.2 9.0  --    Liver Function Tests:  Recent Labs Lab 06/24/15 1023 06/29/15 1055  AST 24 29  ALT 13* 16*  ALKPHOS 63 56  BILITOT 1.9* 1.0  PROT 7.5 6.7  ALBUMIN 4.1 3.7   No results for input(s): LIPASE, AMYLASE in the last 168 hours.  Recent Labs Lab 06/29/15 1300  AMMONIA 19   CBC:  Recent Labs Lab 06/24/15 1023 06/29/15 1055 06/29/15 1515  WBC 9.5 7.3  --   NEUTROABS 6.4  --   --   HGB 15.2 13.5 14.3  HCT 44.6 39.9 42.0  MCV 86.6 86.4  --   PLT 201 179  --    Cardiac Enzymes: No results for input(s): CKTOTAL, CKMB, CKMBINDEX, TROPONINI in the last 168 hours.  BNP (last 3 results) No results for input(s): BNP in the last 8760 hours.  ProBNP (last 3 results) No results for input(s): PROBNP in the last 8760 hours.  CBG: No results for input(s): GLUCAP in the last 168 hours.  Radiological Exams on Admission: Dg Chest 1 View  06/29/2015  CLINICAL DATA:  Altered mental status.  History of bladder cancer. EXAM: CHEST 1 VIEW COMPARISON:  None. FINDINGS: Study is hypoinspiratory with crowding of the perihilar bronchovascular markings. Pulmonary arteries somewhat prominent in appearance raising the possibility of a chronic underlying pulmonary artery  hypertension. Lungs are otherwise clear. No evidence of pneumonia. No pleural effusion seen. No pneumothorax. Heart size is normal. Overall cardiomediastinal silhouette is within normal limits in size and configuration. Right chest wall Port-A-Cath appears well positioned with tip projected over the lower SVC. No acute osseous abnormality seen. IMPRESSION: Lungs are clear and there is no evidence of acute cardiopulmonary abnormality. Questionable pulmonary artery hypertension. Hypoinspiratory changes as described above. Electronically Signed   By: Franki Cabot M.D.   On: 06/29/2015 13:10   Ct Head Wo Contrast  06/29/2015  CLINICAL DATA:  Altered mental status today. History of bladder carcinoma. EXAM: CT HEAD WITHOUT CONTRAST TECHNIQUE: Contiguous axial images were obtained from the base  of the skull through the vertex without intravenous contrast. COMPARISON:  Brain MRI 08/12/2014. FINDINGS: The brain is atrophic. No evidence of acute abnormality including hemorrhage, infarct, mass lesion, mass effect, midline shift or abnormal extra-axial fluid collection is identified. There is no hydrocephalus or pneumocephalus. Imaged paranasal sinuses and mastoid air cells are clear. IMPRESSION: No acute abnormality. Cortical atrophy. Electronically Signed   By: Inge Rise M.D.   On: 06/29/2015 13:20      Assessment/Plan   1. Altered mental status. Etiology is unclear. Urine drug screen is negative. There does not seem to be any specific drug effects. CT brain scan is unremarkable. I will obtain MRI brain scan and neurology consultation. 2. Metastatic urothelial cancer. 3. Chronic kidney disease. His creatinine appears to be at baseline.  He'll be admitted to the medical floor. Further recommendations will depend on patient's hospital progress.   Code Status: Full code.  DVT Prophylaxis: Lovenox.  Family Communication: I discussed the plan with the patient's sister and brother-in-law at the bedside.     Disposition Plan: Home when medically stable  Time spent: 45 minutes.  Doree Albee [facility] Pager [phone].

## 2015-06-29 NOTE — ED Notes (Signed)
RT at bedside.

## 2015-06-30 ENCOUNTER — Ambulatory Visit (HOSPITAL_COMMUNITY)
Admit: 2015-06-30 | Discharge: 2015-06-30 | Disposition: A | Discharge status: Home / Self Care | Payer: Commercial Managed Care - HMO | Attending: Internal Medicine | Admitting: Internal Medicine

## 2015-06-30 ENCOUNTER — Other Ambulatory Visit (HOSPITAL_COMMUNITY): Payer: Self-pay | Admitting: Oncology

## 2015-06-30 DIAGNOSIS — F418 Other specified anxiety disorders: Secondary | ICD-10-CM

## 2015-06-30 DIAGNOSIS — N183 Chronic kidney disease, stage 3 unspecified: Secondary | ICD-10-CM | POA: Diagnosis present

## 2015-06-30 DIAGNOSIS — G9340 Encephalopathy, unspecified: Secondary | ICD-10-CM

## 2015-06-30 DIAGNOSIS — C679 Malignant neoplasm of bladder, unspecified: Secondary | ICD-10-CM

## 2015-06-30 LAB — COMPREHENSIVE METABOLIC PANEL
ALT: 14 U/L — ABNORMAL LOW (ref 17–63)
ANION GAP: 10 (ref 5–15)
AST: 27 U/L (ref 15–41)
Albumin: 3.4 g/dL — ABNORMAL LOW (ref 3.5–5.0)
Alkaline Phosphatase: 60 U/L (ref 38–126)
BILIRUBIN TOTAL: 0.9 mg/dL (ref 0.3–1.2)
BUN: 23 mg/dL — ABNORMAL HIGH (ref 6–20)
CHLORIDE: 102 mmol/L (ref 101–111)
CO2: 26 mmol/L (ref 22–32)
Calcium: 8.3 mg/dL — ABNORMAL LOW (ref 8.9–10.3)
Creatinine, Ser: 1.89 mg/dL — ABNORMAL HIGH (ref 0.61–1.24)
GFR, EST AFRICAN AMERICAN: 42 mL/min — AB (ref >60)
GFR, EST NON AFRICAN AMERICAN: 36 mL/min — AB (ref >60)
Glucose, Bld: 111 mg/dL — ABNORMAL HIGH (ref 65–99)
POTASSIUM: 3.4 mmol/L — AB (ref 3.5–5.1)
Sodium: 138 mmol/L (ref 135–145)
TOTAL PROTEIN: 6.1 g/dL — AB (ref 6.5–8.1)

## 2015-06-30 LAB — CBC
HEMATOCRIT: 41.1 % (ref 39.0–52.0)
HEMOGLOBIN: 13.6 g/dL (ref 13.0–17.0)
MCH: 28.7 pg (ref 26.0–34.0)
MCHC: 33.1 g/dL (ref 30.0–36.0)
MCV: 86.7 fL (ref 78.0–100.0)
Platelets: 174 10*3/uL (ref 150–400)
RBC: 4.74 MIL/uL (ref 4.22–5.81)
RDW: 12.9 % (ref 11.5–15.5)
WBC: 8 10*3/uL (ref 4.0–10.5)

## 2015-06-30 LAB — VITAMIN B12: Vitamin B-12: 931 pg/mL — ABNORMAL HIGH (ref 180–914)

## 2015-06-30 MED ORDER — LORAZEPAM 2 MG/ML IJ SOLN: 0.5000 mg | INTRAMUSCULAR | Status: DC | PRN

## 2015-06-30 MED ORDER — POTASSIUM CHLORIDE CRYS ER 20 MEQ PO TBCR
20.0000 meq | EXTENDED_RELEASE_TABLET | Freq: Three times a day (TID) | ORAL | Status: AC
  Administered 2015-06-30 (×3): 20 meq via ORAL
  Filled 2015-06-30 (×3): qty 1

## 2015-06-30 MED ORDER — GADOBENATE DIMEGLUMINE 529 MG/ML IV SOLN
10.0000 mL | Freq: Once | INTRAVENOUS | Status: AC
  Administered 2015-06-30: 7 mL via INTRAVENOUS

## 2015-06-30 NOTE — Progress Notes (Addendum)
[facility] PROGRESS NOTE  Jesus [name] [id number] DOB: 1951-01-15 DOA: 06/29/2015 PCP: Wende Neighbors, MD    Code Status: Full code Family Communication: Family not available; discussed with patient Disposition Plan: Discharge when clinically appropriate   Consultants:  Neurology, pending  Oncology, pending  Procedures:  EEG ordered  Antibiotics:  None  HPI/Subjective: Patient has no complaints of headache, dizziness, chest pain, or shortness of breath. He does not recall what happened last night and why he was brought to the hospital.  Objective: Filed Vitals:   06/30/15 0000 06/30/15 0400  BP: 140/87 130/84  Pulse: 70 68  Temp: 98.1 F (36.7 C) 98.4 F (36.9 C)  Resp: 18 17   oxygen saturation 98% on room air No intake or output data in the 24 hours ending 06/30/15 0956 Filed Weights   06/29/15 1700  Weight: 71.94 kg (158 lb 9.6 oz)    Exam:   General:  65 year old Caucasian man sitting up in bed, in no acute distress.  Cardiovascular: S1, S2, with a soft systolic murmur. Right chest wall with Port-A-Cath in place.  Respiratory: Clear to auscultation bilaterally.  Abdomen: Positive bowel sounds, soft, minimal hypogastric tenderness without rebound, guarding, or distention.  Musculoskeletal/extremities: No pedal edema. No acute rigid joints; but mild excoriation on the left knee.  Neurologic/psychiatric: He has a flat affect. He is alert and oriented to himself, hospital, year, and month. He drifts off for few seconds, but answers the questions appropriately. He is able to raise each leg against gravity, but has more difficulty with raising his left leg. He also has some difficulty understanding cerebellar finger to nose testing-however when he finally gets it, there appears to be no intention tremor. Cranial nerves II through XII appear grossly intact.   Data Reviewed: Basic Metabolic Panel:  Recent Labs Lab 06/24/15 1023 06/29/15 1055  06/29/15 1515 06/30/15 0606  NA 138 137 137 138  K 3.9 3.6 3.4* 3.4*  CL 99* 99* 100* 102  CO2 26 25  --  26  GLUCOSE 111* 222* 82 111*  BUN 31* 28* 25* 23*  CREATININE 1.99* 2.14* 1.70* 1.89*  CALCIUM 9.2 9.0  --  8.3*   Liver Function Tests:  Recent Labs Lab 06/24/15 1023 06/29/15 1055 06/30/15 0606  AST 24 29 27   ALT 13* 16* 14*  ALKPHOS 63 56 60  BILITOT 1.9* 1.0 0.9  PROT 7.5 6.7 6.1*  ALBUMIN 4.1 3.7 3.4*   No results for input(s): LIPASE, AMYLASE in the last 168 hours.  Recent Labs Lab 06/29/15 1300  AMMONIA 19   CBC:  Recent Labs Lab 06/24/15 1023 06/29/15 1055 06/29/15 1515 06/30/15 0606  WBC 9.5 7.3  --  8.0  NEUTROABS 6.4  --   --   --   HGB 15.2 13.5 14.3 13.6  HCT 44.6 39.9 42.0 41.1  MCV 86.6 86.4  --  86.7  PLT 201 179  --  174   Cardiac Enzymes: No results for input(s): CKTOTAL, CKMB, CKMBINDEX, TROPONINI in the last 168 hours. BNP (last 3 results) No results for input(s): BNP in the last 8760 hours.  ProBNP (last 3 results) No results for input(s): PROBNP in the last 8760 hours.  CBG: No results for input(s): GLUCAP in the last 168 hours.  No results found for this or any previous visit (from the past 240 hour(s)).   Studies: Dg Chest 1 View  06/29/2015  CLINICAL DATA:  Altered mental status.  History of bladder cancer. EXAM: CHEST  1 VIEW COMPARISON:  None. FINDINGS: Study is hypoinspiratory with crowding of the perihilar bronchovascular markings. Pulmonary arteries somewhat prominent in appearance raising the possibility of a chronic underlying pulmonary artery hypertension. Lungs are otherwise clear. No evidence of pneumonia. No pleural effusion seen. No pneumothorax. Heart size is normal. Overall cardiomediastinal silhouette is within normal limits in size and configuration. Right chest wall Port-A-Cath appears well positioned with tip projected over the lower SVC. No acute osseous abnormality seen. IMPRESSION: Lungs are clear and  there is no evidence of acute cardiopulmonary abnormality. Questionable pulmonary artery hypertension. Hypoinspiratory changes as described above. Electronically Signed   By: Franki Cabot M.D.   On: 06/29/2015 13:10   Ct Head Wo Contrast  06/29/2015  CLINICAL DATA:  Altered mental status today. History of bladder carcinoma. EXAM: CT HEAD WITHOUT CONTRAST TECHNIQUE: Contiguous axial images were obtained from the base of the skull through the vertex without intravenous contrast. COMPARISON:  Brain MRI 08/12/2014. FINDINGS: The brain is atrophic. No evidence of acute abnormality including hemorrhage, infarct, mass lesion, mass effect, midline shift or abnormal extra-axial fluid collection is identified. There is no hydrocephalus or pneumocephalus. Imaged paranasal sinuses and mastoid air cells are clear. IMPRESSION: No acute abnormality. Cortical atrophy. Electronically Signed   By: Inge Rise M.D.   On: 06/29/2015 13:20    Scheduled Meds: . enoxaparin (LOVENOX) injection  40 mg Subcutaneous Q24H   Continuous Infusions: . sodium chloride 50 mL/hr at 06/29/15 1752   Assessment and plan:  Principal Problem:   Acute encephalopathy Active Problems:   AKI (acute kidney injury) ([place])   Metastatic urothelial carcinoma ([place])   Depression with anxiety   1. Acute encephalopathy with amnesia. The patient presented to the ED after being found with confusion and global amnesia as he was found approximately half a mile from his home carrying a basket of laundry in the middle of the road. He has no recollection of these events. He denies starting any new medications or any changes in his medications. He recently was restarted on chemotherapy for progression of his bladder cancer. On admission, CT scan of his head revealed no acute abnormalities. His ammonia level was within normal limits. His ABG was virtually unremarkable. His urine drug screen was negative. Alcohol level was negative. His TSH on 06/24/15  was within normal limits. -For further evaluation, we'll order MRI of the brain, EEG, and vitamin B12 level. (The patient will be transferred to [facility] radiology for the MRI as the MRI scanner at [facility] is not working). -Continue neuro checks. -Neurology has been consulted.  Acute kidney injury. Patient's renal function was within normal limits 5-6 months ago. It has gotten worse progressively. Over the past 4 weeks, his creatinine has increased from 1.4-2.14 on admission. Etiology is unknown, but could be secondary to prerenal azotemia and possibly chemotherapy. -And was started on gentle IV fluids. Will increase IV fluids and follow his renal function. Consider renal ultrasound if his renal point function does not improve.  Metastatic urothelial carcinoma. Patient is followed by Dr. Whitney Muse. He has stage IV urothelial carcinoma. He received chemotherapy finishing April 2016 and apparently had metabolic remission on PET imaging in May 2016. He now has evidence of metastatic disease. Chemotherapy was restarted last week. He also underwent TURBT by Dr. Jeffie Pollock on 06/19/15. Burnis Medin consult oncology.  Mild hypokalemia. We'll supplement orally.   Time spent: 35 minutes    [facility] Pager [phone]. If 7PM-7AM, please contact night-coverage at [email or web address], password [facility]  06/30/2015, 9:56 AM  LOS: 1 day

## 2015-06-30 NOTE — Consult Note (Signed)
[facility] Hematology/Oncology Consultation   Name: SHALEV HELMINIAK      MRN: [id number]    Location: A313/A313-01  Date: 06/30/2015 Time:3:27 PM   REFERRING PHYSICIAN:  Rexene Alberts, MD  REASON FOR CONSULT:  Urgent consult for: Bladder cancer    DIAGNOSIS:  Stage IV urothelial carcinoma, Day 7 of cycle #1 of Atezolizumab  HISTORY OF PRESENT ILLNESS:   Mr. Prehn is a 65 year old white American man who is well known to the [facility] where he is undergoing immunotherapy with Atezolizumab for relapsed, Stage IV urothelial cancer beginning on 06/24/2015, thus, he is Day 7 of cycle #1.    Metastatic urothelial carcinoma ([place])   07/09/2014 Imaging  CT A/P Irregular and lobular bladder wall thickening suspicious for transitional cell carcinoma. Considerable associated pelvic and retroperitoneal adenopathy. 1.4 by 1.3 cm complex and likely enhancing lesion of the right hepatic lobe dome   07/16/2014 - 07/19/2014 Hospital Admission Hospital admission for TURP. Course complicated by ARF   2/99/3716 Procedure TRANSURETHRAL RESECTION OF BLADDER TUMOR WITH GYRUS (TURBT-GYRUS) CYSTOSCOPY WITH PROSTATE ULTRASOUND AND BIOPSY.  Dr. Irine Seal   08/08/2014 PET scan Irregular bladder wall thickening, compatible with known primary bladder neoplasm. Associated mild left hydroureteronephrosis. Extensive retroperitoneal nodal metastases. Associated pelvic implant, suspicious for peritoneal disease.2.3 metastasis liver   08/23/2014 - 10/10/2014 Chemotherapy Carboplatin and Gemzar day 1 and 8 given in the palliative setting   10/10/2014 Treatment Plan Change Changing chemotherapy to every other week and cycle length to 28 day cycle   10/10/2014 - 01/23/2015 Chemotherapy Carboplatin/Gemzar day 1 and 15 every 28 days in the palliative setting x 6 cycles.   11/04/2014 PET scan Interval complete metabolic response to therapy.  Residual, borderline left periaortic and right external iliac lymph nodes  exhibit low level, nonspecific FDG uptake.   02/20/2015 Remission PET- No evidence of local bladder cancer recurrence or distant metastatic disease. Small RIGHT lung pulmonary nodules are stable.   06/06/2015 Progression PET- definitive evidence of recurrence of disease, with a large left-sided bladder wall mass, infiltrated into the surrounding soft tissues, with extensive surrounding lymphadenopathy throughout the soft tissues of the pelvic floor, along the left pelvic   06/24/2015 -  Chemotherapy Atezolizumab    I personally reviewed and went over laboratory results with the patient.  The results are noted within this dictation.  Labs are unimpressive.  I personally reviewed and went over radiographic studies with the patient.  The results are noted within this dictation.  CT head and MRI brain are unimpressive from an oncology standpoint.  Chart reviewed.  He was reportedly found on 06/28/2015 approximately 0.5 mile from his home carrying a bag of laundry in the middle of the road.  He has no recollection of the events.  He was found by a firefighter and brought to the [facility] ED for further evaluation.  He reports he feels good today.  He gives Dr. Whitney Muse a hug upon her arrival into the patient room. He notes that he did not feel "right" around 6 pm last night Laundry is available at his place of living, but dryer is broken and therefore, he was going to take his clothes to the laundromat for drying.  He reports that he was walking down the road because there were no side walks where he was walking.  He reports getting tired and putting his laundry basket down to rest. He notes periods of amnesia.  I am unclear about  moments last night.   He denies any new medications. Nothing new prescribed by Dr. Jeffie Pollock.  After surgery, he felt "different" for about 3-4 hours after recovery from anesthesia.   Episodes of loss of words during discussion.   PAST MEDICAL HISTORY:   Past Medical History    Diagnosis Date  . High cholesterol   . Depression     hx of   . Arthritis   . Bladder cancer ([place])   . Metastatic urothelial carcinoma ([place]) 07/16/2014    ALLERGIES: No Known Allergies    MEDICATIONS: I have reviewed the patient's current medications.    No current facility-administered medications on file prior to encounter.   Current Outpatient Prescriptions on File Prior to Encounter  Medication Sig Dispense Refill  . HYDROcodone-acetaminophen (NORCO) 5-325 MG tablet Take 1 tablet by mouth every 6 (six) hours as needed for moderate pain. (Patient not taking: Reported on 06/24/2015) 15 tablet 0  . LORazepam (ATIVAN) 0.5 MG tablet Take 0.5 mg by mouth every 6 (six) hours as needed for anxiety (nausea). Reported on 06/24/2015    . ondansetron (ZOFRAN) 8 MG tablet Take by mouth every 12 (twelve) hours as needed for nausea or vomiting. Reported on 06/24/2015    . predniSONE (DELTASONE) 10 MG tablet Take 7 tablets ('70mg'$  total) daily @ the onset of loose stools/diarrhea & contact [place]. (Patient not taking: Reported on 06/24/2015) 42 tablet 0  . prochlorperazine (COMPAZINE) 10 MG tablet Take 10 mg by mouth every 6 (six) hours as needed for nausea or vomiting. Reported on 06/24/2015       PAST SURGICAL HISTORY Past Surgical History  Procedure Laterality Date  . Knee surgery    . Tonsillectomy    . Transurethral resection of bladder tumor with gyrus (turbt-gyrus) N/A 07/16/2014    Procedure: TRANSURETHRAL RESECTION OF BLADDER TUMOR WITH GYRUS (TURBT-GYRUS);  Surgeon: Malka So, MD;  Location: [facility];  Service: Urology;  Laterality: N/A;  . Cystoscopy with biopsy N/A 07/16/2014    Procedure: CYSTOSCOPY WITH PROSTATE ULTRASOUND AND BIOPSY;  Surgeon: Malka So, MD;  Location: [facility];  Service: Urology;  Laterality: N/A;  . Transurethral resection of bladder tumor N/A 06/19/2015    Procedure: TRANSURETHRAL RESECTION OF BLADDER TUMOR (TURBT);  Surgeon: Irine Seal, MD;  Location: [facility];   Service: Urology;  Laterality: N/A;    FAMILY HISTORY: Family History  Problem Relation Age of Onset  . Cancer Mother   . Stroke Father   . Cancer Sister     SOCIAL HISTORY:  reports that he has never smoked. He has never used smokeless tobacco. He reports that he does not drink alcohol or use illicit drugs.  PERFORMANCE STATUS: The patient's performance status is 1 - Symptomatic but completely ambulatory  PHYSICAL EXAM: Most Recent Vital Signs: Blood pressure 146/98, pulse 66, temperature 98.1 F (36.7 C), temperature source Oral, resp. rate 18, height '5\' 10"'$  (1.778 m), weight 158 lb 9.6 oz (71.94 kg), SpO2 99 %. General appearance: alert, cooperative, no distress, slowed mentation and no family at the bedside Head: Normocephalic, without obvious abnormality, atraumatic Eyes: negative findings: lids and lashes normal, conjunctivae and sclerae normal and corneas clear Lungs: clear to auscultation bilaterally Heart: regular rate and rhythm, S1, S2 normal, no murmur, click, rub or gallop Extremities: extremities normal, atraumatic, no cyanosis or edema Skin: Skin color, texture, turgor normal. No rashes or lesions Neurologic: Mental status: alertness: alert, orientation: person, place, affect: normal and agitated, friendly, hypervigilant, thought  content exhibits flight of ideas  LABORATORY DATA:  Results for orders placed or performed during the hospital encounter of 06/29/15 (from the past 48 hour(s))  Ethanol     Status: None   Collection Time: 06/29/15 10:50 AM  Result Value Ref Range   Alcohol, Ethyl (B) <5 <5 mg/dL    Comment:        LOWEST DETECTABLE LIMIT FOR SERUM ALCOHOL IS 5 mg/dL FOR MEDICAL PURPOSES ONLY   Vitamin B12     Status: Abnormal   Collection Time: 06/29/15 10:50 AM  Result Value Ref Range   Vitamin B-12 931 (H) 180 - 914 pg/mL    Comment: (NOTE) This assay is not validated for testing neonatal or myeloproliferative syndrome specimens for Vitamin B12  levels. Performed at [facility]   Comprehensive metabolic panel     Status: Abnormal   Collection Time: 06/29/15 10:55 AM  Result Value Ref Range   Sodium 137 135 - 145 mmol/L   Potassium 3.6 3.5 - 5.1 mmol/L   Chloride 99 (L) 101 - 111 mmol/L   CO2 25 22 - 32 mmol/L   Glucose, Bld 222 (H) 65 - 99 mg/dL   BUN 28 (H) 6 - 20 mg/dL   Creatinine, Ser 2.14 (H) 0.61 - 1.24 mg/dL   Calcium 9.0 8.9 - 10.3 mg/dL   Total Protein 6.7 6.5 - 8.1 g/dL   Albumin 3.7 3.5 - 5.0 g/dL   AST 29 15 - 41 U/L   ALT 16 (L) 17 - 63 U/L   Alkaline Phosphatase 56 38 - 126 U/L   Total Bilirubin 1.0 0.3 - 1.2 mg/dL   GFR calc non Af Amer 31 (L) >60 mL/min   GFR calc Af Amer 36 (L) >60 mL/min    Comment: (NOTE) The eGFR has been calculated using the CKD EPI equation. This calculation has not been validated in all clinical situations. eGFR's persistently <60 mL/min signify possible Chronic Kidney Disease.    Anion gap 13 5 - 15  CBC     Status: None   Collection Time: 06/29/15 10:55 AM  Result Value Ref Range   WBC 7.3 4.0 - 10.5 K/uL   RBC 4.62 4.22 - 5.81 MIL/uL   Hemoglobin 13.5 13.0 - 17.0 g/dL   HCT 39.9 39.0 - 52.0 %   MCV 86.4 78.0 - 100.0 fL   MCH 29.2 26.0 - 34.0 pg   MCHC 33.8 30.0 - 36.0 g/dL   RDW 12.8 11.5 - 15.5 %   Platelets 179 150 - 400 K/uL  I-stat troponin, ED     Status: None   Collection Time: 06/29/15 11:56 AM  Result Value Ref Range   Troponin i, poc 0.00 0.00 - 0.08 ng/mL   Comment 3            Comment: Due to the release kinetics of cTnI, a negative result within the first hours of the onset of symptoms does not rule out myocardial infarction with certainty. If myocardial infarction is still suspected, repeat the test at appropriate intervals.   I-Stat CG4 Lactic Acid, ED     Status: Abnormal   Collection Time: 06/29/15 11:58 AM  Result Value Ref Range   Lactic Acid, Venous 2.49 (HH) 0.5 - 2.0 mmol/L  Blood gas, arterial ([facility] ONLY)     Status: Abnormal     Collection Time: 06/29/15 12:51 PM  Result Value Ref Range   FIO2 0.21    O2 Content 21.0 L/min  Delivery systems ROOM AIR    pH, Arterial 7.471 (H) 7.350 - 7.450   pCO2 arterial 34.5 (L) 35.0 - 45.0 mmHg   pO2, Arterial 88.8 80.0 - 100.0 mmHg   Bicarbonate 26.2 (H) 20.0 - 24.0 mEq/L   Acid-Base Excess 1.5 0.0 - 2.0 mmol/L   O2 Saturation 96.9 %   Collection site LEFT RADIAL    Drawn by [phone]    Sample type ARTERIAL    Allens test (pass/fail) PASS PASS  Carboxyhemoglobin     Status: Abnormal   Collection Time: 06/29/15 12:51 PM  Result Value Ref Range   Total hemoglobin 13.9 13.5 - 18.0 g/dL   O2 Saturation 96.9 %   Carboxyhemoglobin 1.2 0.5 - 1.5 %   Methemoglobin 0.6 0.0 - 1.5 %   Total oxygen content 34.5 (H) 15.0 - 23.0 mL/dL  Ammonia     Status: None   Collection Time: 06/29/15  1:00 PM  Result Value Ref Range   Ammonia 19 9 - 35 umol/L  Urinalysis, Routine w reflex microscopic (not at [facility])     Status: Abnormal   Collection Time: 06/29/15  1:10 PM  Result Value Ref Range   Color, Urine YELLOW YELLOW   APPearance CLEAR CLEAR   Specific Gravity, Urine 1.025 1.005 - 1.030   pH 6.0 5.0 - 8.0   Glucose, UA NEGATIVE NEGATIVE mg/dL   Hgb urine dipstick LARGE (A) NEGATIVE   Bilirubin Urine SMALL (A) NEGATIVE   Ketones, ur NEGATIVE NEGATIVE mg/dL   Protein, ur 100 (A) NEGATIVE mg/dL   Nitrite NEGATIVE NEGATIVE   Leukocytes, UA NEGATIVE NEGATIVE  Urine rapid drug screen (hosp performed)     Status: None   Collection Time: 06/29/15  1:10 PM  Result Value Ref Range   Opiates NONE DETECTED NONE DETECTED   Cocaine NONE DETECTED NONE DETECTED   Benzodiazepines NONE DETECTED NONE DETECTED   Amphetamines NONE DETECTED NONE DETECTED   Tetrahydrocannabinol NONE DETECTED NONE DETECTED   Barbiturates NONE DETECTED NONE DETECTED    Comment:        DRUG SCREEN FOR MEDICAL PURPOSES ONLY.  IF CONFIRMATION IS NEEDED FOR ANY PURPOSE, NOTIFY LAB WITHIN 5 DAYS.        LOWEST  DETECTABLE LIMITS FOR URINE DRUG SCREEN Drug Class       Cutoff (ng/mL) Amphetamine      1000 Barbiturate      200 Benzodiazepine   355 Tricyclics       732 Opiates          300 Cocaine          300 THC              50   Urine microscopic-add on     Status: None   Collection Time: 06/29/15  1:10 PM  Result Value Ref Range   Squamous Epithelial / LPF NONE SEEN NONE SEEN   WBC, UA NONE SEEN 0 - 5 WBC/hpf   RBC / HPF TOO NUMEROUS TO COUNT 0 - 5 RBC/hpf   Bacteria, UA NONE SEEN NONE SEEN  I-stat chem 8, ed     Status: Abnormal   Collection Time: 06/29/15  3:15 PM  Result Value Ref Range   Sodium 137 135 - 145 mmol/L   Potassium 3.4 (L) 3.5 - 5.1 mmol/L   Chloride 100 (L) 101 - 111 mmol/L   BUN 25 (H) 6 - 20 mg/dL   Creatinine, Ser 1.70 (H) 0.61 - 1.24 mg/dL   Glucose,  Bld 82 65 - 99 mg/dL   Calcium, Ion 1.04 (L) 1.13 - 1.30 mmol/L   TCO2 25 0 - 100 mmol/L   Hemoglobin 14.3 13.0 - 17.0 g/dL   HCT 42.0 39.0 - 52.0 %  I-Stat CG4 Lactic Acid, ED     Status: None   Collection Time: 06/29/15  3:31 PM  Result Value Ref Range   Lactic Acid, Venous 0.91 0.5 - 2.0 mmol/L  Comprehensive metabolic panel     Status: Abnormal   Collection Time: 06/30/15  6:06 AM  Result Value Ref Range   Sodium 138 135 - 145 mmol/L   Potassium 3.4 (L) 3.5 - 5.1 mmol/L   Chloride 102 101 - 111 mmol/L   CO2 26 22 - 32 mmol/L   Glucose, Bld 111 (H) 65 - 99 mg/dL   BUN 23 (H) 6 - 20 mg/dL   Creatinine, Ser 1.89 (H) 0.61 - 1.24 mg/dL   Calcium 8.3 (L) 8.9 - 10.3 mg/dL   Total Protein 6.1 (L) 6.5 - 8.1 g/dL   Albumin 3.4 (L) 3.5 - 5.0 g/dL   AST 27 15 - 41 U/L   ALT 14 (L) 17 - 63 U/L   Alkaline Phosphatase 60 38 - 126 U/L   Total Bilirubin 0.9 0.3 - 1.2 mg/dL   GFR calc non Af Amer 36 (L) >60 mL/min   GFR calc Af Amer 42 (L) >60 mL/min    Comment: (NOTE) The eGFR has been calculated using the CKD EPI equation. This calculation has not been validated in all clinical situations. eGFR's persistently  <60 mL/min signify possible Chronic Kidney Disease.    Anion gap 10 5 - 15  CBC     Status: None   Collection Time: 06/30/15  6:06 AM  Result Value Ref Range   WBC 8.0 4.0 - 10.5 K/uL   RBC 4.74 4.22 - 5.81 MIL/uL   Hemoglobin 13.6 13.0 - 17.0 g/dL   HCT 41.1 39.0 - 52.0 %   MCV 86.7 78.0 - 100.0 fL   MCH 28.7 26.0 - 34.0 pg   MCHC 33.1 30.0 - 36.0 g/dL   RDW 12.9 11.5 - 15.5 %   Platelets 174 150 - 400 K/uL      RADIOGRAPHY: Dg Chest 1 View  06/29/2015  CLINICAL DATA:  Altered mental status.  History of bladder cancer. EXAM: CHEST 1 VIEW COMPARISON:  None. FINDINGS: Study is hypoinspiratory with crowding of the perihilar bronchovascular markings. Pulmonary arteries somewhat prominent in appearance raising the possibility of a chronic underlying pulmonary artery hypertension. Lungs are otherwise clear. No evidence of pneumonia. No pleural effusion seen. No pneumothorax. Heart size is normal. Overall cardiomediastinal silhouette is within normal limits in size and configuration. Right chest wall Port-A-Cath appears well positioned with tip projected over the lower SVC. No acute osseous abnormality seen. IMPRESSION: Lungs are clear and there is no evidence of acute cardiopulmonary abnormality. Questionable pulmonary artery hypertension. Hypoinspiratory changes as described above. Electronically Signed   By: Franki Cabot M.D.   On: 06/29/2015 13:10   Ct Head Wo Contrast  06/29/2015  CLINICAL DATA:  Altered mental status today. History of bladder carcinoma. EXAM: CT HEAD WITHOUT CONTRAST TECHNIQUE: Contiguous axial images were obtained from the base of the skull through the vertex without intravenous contrast. COMPARISON:  Brain MRI 08/12/2014. FINDINGS: The brain is atrophic. No evidence of acute abnormality including hemorrhage, infarct, mass lesion, mass effect, midline shift or abnormal extra-axial fluid collection is identified. There is no  hydrocephalus or pneumocephalus. Imaged paranasal  sinuses and mastoid air cells are clear. IMPRESSION: No acute abnormality. Cortical atrophy. Electronically Signed   By: Inge Rise M.D.   On: 06/29/2015 13:20   Mr Jeri Cos QT Contrast  06/30/2015  CLINICAL DATA:  65 year old male with bladder cancer recently starting immunotherapy presenting with altered mental status/global amnesia. Subsequent encounter. EXAM: MRI HEAD WITHOUT AND WITH CONTRAST TECHNIQUE: Multiplanar, multiecho pulse sequences of the brain and surrounding structures were obtained without and with intravenous contrast. CONTRAST:  41m MULTIHANCE GADOBENATE DIMEGLUMINE 529 MG/ML IV SOLN COMPARISON:  06/29/2015 CT.  08/12/2014 MR. FINDINGS: Exam is motion degraded. No acute infarct or intracranial hemorrhage. Moderate to marked global atrophy most notable cerebellum without hydrocephalus. Mild nonspecific periventricular white matter changes. No intracranial mass or osseous destructive lesion to suggest presence of intracranial metastatic disease. Major intracranial vascular structures are patent. Cervical medullary junction, pituitary region, pineal region and orbital structures unremarkable. Mucosal thickening ethmoid sinus air cells. IMPRESSION: Exam is motion degraded. No acute infarct or intracranial hemorrhage. Moderate to marked global atrophy most notable cerebellum without hydrocephalus. Mild nonspecific periventricular white matter changes. No intracranial mass or osseous destructive lesion to suggest presence of intracranial metastatic disease. Electronically Signed   By: SGenia DelM.D.   On: 06/30/2015 13:34       PATHOLOGY:  None  ASSESSMENT/PLAN:  Stage IV Urothelial Carcinoma, recurrent Confusion/Altered MS of uncertain etiology  Acute altered mental status. Patient is not quite back to baseline MS.  No oncologic explanation.  Negative MRI of brain for abnormality that can explain this change.  Neurology is consulted.  Atezolizumab has less than 1% incidence of CNS  toxicity.  Confusion persists with some agitation.  He is below baseline at this time.  He does have some mental incapacities at baseline but is high functioning and very independent..  Will discuss case with FZola Button([facility] [place] tomorrow for opinion.  Will consider high dose steroids tomorrow based upon this conversation and Neurology consult.  Patient had to use the restroom.  We helped out of bed and into the bathroom.  Patient proceeded to urinate on the floor on the way into the restroom.  Neurology consulted.  Will follow along while in the hospital.  All questions were answered. The patient knows to call the clinic with any problems, questions or concerns. We can certainly see the patient much sooner if necessary.  Patient and plan discussed with Dr. SAncil Linseyand she is in agreement with the aforementioned.   This note is electronically signed by: KDoy Mince 06/30/2015 3:27 PM  I do not think this is secondary to Atezolizumab however these are new drugs.  Staff reported patient was late coming to his infusion appointment last week which is very unusual for him as he is very timely. No other problems reported.  I will speak with Dr. SBarbaraann Fastertomorrow.  Will await input from Neurology. May Consider high dose steroids if neuro evaluation is negative and patient remains confused/agitated.  Discussed with son as well. SDonald PoreMD

## 2015-06-30 NOTE — Consult Note (Signed)
[place] A. Merlene Laughter, MD     [email or web address]          Jesus [name] is an 65 y.o. male.   ASSESSMENT/PLAN:  1. Transient episode of amnesia. The episode seems most consistent with transient global amnesia which is typically monophasic. However, he seems to have somewhat of a odd personality although this is his may be his baseline.   RECOMMENDATION: EEG to evaluate for possible unusual presentation of complex partial seizures. Dementia labs. Repeat neurological evaluation/exam. Carotid duplex Doppler.   The patient is a 65 year old white male who has a history of bladder carcinoma. Patient is status post chemotherapy with apparent recent recurrent of the bladder cancer with metastasis to the lymph nodes. Patient underwent combination chemotherapy a few months ago for his initial presentation. He received his first administration of an immunotherapy for the cancer. The patient had this administration a few days ago. The patient was found to wandering on [address] per the patient. He does report apparently getting groceries from the [employer] on [address] and walking all the way over to [address] at [employer] area. The patient reports that he has done this before many times and apparently walks home. He has about 2 hours of total memory loss. He tells me he was found by the police apparently confused. He does remember the last thing was walking home with his groceries and taking a break because he was tired and short of breath. The patient does not complain of any other problems at this time. Denies having any headaches, chest pain, focal neurological symptoms or dysarthria. The review of systems otherwise negative. The patient does live by himself and apparently has done so since 2007. He apparently has lived by himself since that with his mother in 2007. The patient reports that he is separated from his wife and does have son.  GENERAL: The patient is  eating and is quite focused on this. He seems somewhat bother that I am here to evaluate him while he is eating.  HEENT: Supple. Atraumatic normocephalic.   ABDOMEN: soft  EXTREMITIES: No edema   BACK: Normal.  SKIN: Normal by inspection.    MENTAL STATUS: The patient is awake and alert. He reports that he recognizes me. When asked he reports that I saw him yesterday although this is time I have mat the patient. He thinks that he is at [facility] but reports that he is in [place]. He is oriented to month and the year but thinks that is January 24. The patient seems quite in difference to his overall medical well-being and why he is in the hospital.   CRANIAL NERVES: Pupils are equal, round and reactive to light and accommodation; extra ocular movements are full, there is no significant nystagmus; visual fields are full; upper and lower facial muscles are normal in strength and symmetric, there is no flattening of the nasolabial folds; tongue is midline; uvula is midline; shoulder elevation is normal.  MOTOR: Normal tone, bulk and strength in the arms; no pronator drift. Examination of the legs shows normal bulk and tone but exact strength is uncertain but at least 4+/5. He seemed to have some motor impersistence.  COORDINATION: Left finger to nose is normal, right finger to nose is normal, No rest tremor; no intention tremor; no postural tremor; no bradykinesia.  REFLEXES: Deep tendon reflexes are symmetrical and normal. Babinski reflexes are flexor bilaterally.   SENSATION: Normal to light touch.  The patient's brain MRI is reviewed in person. This MRI with and without contrast. There is moderate global atrophy somewhat more prominent for age. There is no significant white matter lesions. There is nothing acute seen on diffusion imaging. No evidence of hemorrhage.   Blood pressure 146/98, pulse 66, temperature 98.1 F (36.7 C), temperature source Oral, resp. rate 18,  height _0  (1.778 m), weight 71.94 kg (158 lb 9.6 oz), SpO2 99 %.  Past Medical History  Diagnosis Date  . High cholesterol   . Depression     hx of   . Arthritis   . Bladder cancer ([place])   . Metastatic urothelial carcinoma ([place]) 07/16/2014    Past Surgical History  Procedure Laterality Date  . Knee surgery    . Tonsillectomy    . Transurethral resection of bladder tumor with gyrus (turbt-gyrus) N/A 07/16/2014    Procedure: TRANSURETHRAL RESECTION OF BLADDER TUMOR WITH GYRUS (TURBT-GYRUS);  Surgeon: Malka So, MD;  Location: [facility];  Service: Urology;  Laterality: N/A;  . Cystoscopy with biopsy N/A 07/16/2014    Procedure: CYSTOSCOPY WITH PROSTATE ULTRASOUND AND BIOPSY;  Surgeon: Malka So, MD;  Location: [facility];  Service: Urology;  Laterality: N/A;  . Transurethral resection of bladder tumor N/A 06/19/2015    Procedure: TRANSURETHRAL RESECTION OF BLADDER TUMOR (TURBT);  Surgeon: Irine Seal, MD;  Location: [facility];  Service: Urology;  Laterality: N/A;    Family History  Problem Relation Age of Onset  . Cancer Mother   . Stroke Father   . Cancer Sister     Social History:  reports that he has never smoked. He has never used smokeless tobacco. He reports that he does not drink alcohol or use illicit drugs.  Allergies: No Known Allergies  Medications: Prior to Admission medications   Medication Sig Start Date End Date Taking? Authorizing Provider  HYDROcodone-acetaminophen (NORCO) 5-325 MG tablet Take 1 tablet by mouth every 6 (six) hours as needed for moderate pain. Patient not taking: Reported on 06/24/2015 06/19/15   Irine Seal, MD  LORazepam (ATIVAN) 0.5 MG tablet Take 0.5 mg by mouth every 6 (six) hours as needed for anxiety (nausea). Reported on 06/24/2015    Historical Provider, MD  ondansetron (ZOFRAN) 8 MG tablet Take by mouth every 12 (twelve) hours as needed for nausea or vomiting. Reported on 06/24/2015    Historical Provider, MD  predniSONE (DELTASONE) 10 MG tablet  Take 7 tablets (40m total) daily @ the onset of loose stools/diarrhea & contact [place] Patient not taking: Reported on 06/24/2015 06/20/15   TManon HildingKefalas, PA-C  prochlorperazine (COMPAZINE) 10 MG tablet Take 10 mg by mouth every 6 (six) hours as needed for nausea or vomiting. Reported on 06/24/2015    Historical Provider, MD    Scheduled Meds: . enoxaparin (LOVENOX) injection  40 mg Subcutaneous Q24H  . potassium chloride  20 mEq Oral TID   Continuous Infusions: . sodium chloride 125 mL/hr at 06/30/15 1543   PRN Meds:.LORazepam, ondansetron **OR** ondansetron (ZOFRAN) IV     Results for orders placed or performed during the hospital encounter of 06/29/15 (from the past 48 hour(s))  Ethanol     Status: None   Collection Time: 06/29/15 10:50 AM  Result Value Ref Range   Alcohol, Ethyl (B) <5 <5 mg/dL    Comment:        LOWEST DETECTABLE LIMIT FOR SERUM ALCOHOL IS 5 mg/dL FOR MEDICAL PURPOSES ONLY   Vitamin B12  Status: Abnormal   Collection Time: 06/29/15 10:50 AM  Result Value Ref Range   Vitamin B-12 931 (H) 180 - 914 pg/mL    Comment: (NOTE) This assay is not validated for testing neonatal or myeloproliferative syndrome specimens for Vitamin B12 levels. Performed at [facility]   Comprehensive metabolic panel     Status: Abnormal   Collection Time: 06/29/15 10:55 AM  Result Value Ref Range   Sodium 137 135 - 145 mmol/L   Potassium 3.6 3.5 - 5.1 mmol/L   Chloride 99 (L) 101 - 111 mmol/L   CO2 25 22 - 32 mmol/L   Glucose, Bld 222 (H) 65 - 99 mg/dL   BUN 28 (H) 6 - 20 mg/dL   Creatinine, Ser 2.14 (H) 0.61 - 1.24 mg/dL   Calcium 9.0 8.9 - 10.3 mg/dL   Total Protein 6.7 6.5 - 8.1 g/dL   Albumin 3.7 3.5 - 5.0 g/dL   AST 29 15 - 41 U/L   ALT 16 (L) 17 - 63 U/L   Alkaline Phosphatase 56 38 - 126 U/L   Total Bilirubin 1.0 0.3 - 1.2 mg/dL   GFR calc non Af Amer 31 (L) >60 mL/min   GFR calc Af Amer 36 (L) >60 mL/min    Comment: (NOTE) The eGFR has  been calculated using the CKD EPI equation. This calculation has not been validated in all clinical situations. eGFR's persistently <60 mL/min signify possible Chronic Kidney Disease.    Anion gap 13 5 - 15  CBC     Status: None   Collection Time: 06/29/15 10:55 AM  Result Value Ref Range   WBC 7.3 4.0 - 10.5 K/uL   RBC 4.62 4.22 - 5.81 MIL/uL   Hemoglobin 13.5 13.0 - 17.0 g/dL   HCT 39.9 39.0 - 52.0 %   MCV 86.4 78.0 - 100.0 fL   MCH 29.2 26.0 - 34.0 pg   MCHC 33.8 30.0 - 36.0 g/dL   RDW 12.8 11.5 - 15.5 %   Platelets 179 150 - 400 K/uL  I-stat troponin, ED     Status: None   Collection Time: 06/29/15 11:56 AM  Result Value Ref Range   Troponin i, poc 0.00 0.00 - 0.08 ng/mL   Comment 3            Comment: Due to the release kinetics of cTnI, a negative result within the first hours of the onset of symptoms does not rule out myocardial infarction with certainty. If myocardial infarction is still suspected, repeat the test at appropriate intervals.   I-Stat CG4 Lactic Acid, ED     Status: Abnormal   Collection Time: 06/29/15 11:58 AM  Result Value Ref Range   Lactic Acid, Venous 2.49 (HH) 0.5 - 2.0 mmol/L  Blood gas, arterial ([facility] ONLY)     Status: Abnormal   Collection Time: 06/29/15 12:51 PM  Result Value Ref Range   FIO2 0.21    O2 Content 21.0 L/min   Delivery systems ROOM AIR    pH, Arterial 7.471 (H) 7.350 - 7.450   pCO2 arterial 34.5 (L) 35.0 - 45.0 mmHg   pO2, Arterial 88.8 80.0 - 100.0 mmHg   Bicarbonate 26.2 (H) 20.0 - 24.0 mEq/L   Acid-Base Excess 1.5 0.0 - 2.0 mmol/L   O2 Saturation 96.9 %   Collection site LEFT RADIAL    Drawn by [id number]    Sample type ARTERIAL    Allens test (pass/fail) PASS PASS  Carboxyhemoglobin  Status: Abnormal   Collection Time: 06/29/15 12:51 PM  Result Value Ref Range   Total hemoglobin 13.9 13.5 - 18.0 g/dL   O2 Saturation 96.9 %   Carboxyhemoglobin 1.2 0.5 - 1.5 %   Methemoglobin 0.6 0.0 - 1.5 %   Total oxygen  content 34.5 (H) 15.0 - 23.0 mL/dL  Ammonia     Status: None   Collection Time: 06/29/15  1:00 PM  Result Value Ref Range   Ammonia 19 9 - 35 umol/L  Urinalysis, Routine w reflex microscopic (not at [facility])     Status: Abnormal   Collection Time: 06/29/15  1:10 PM  Result Value Ref Range   Color, Urine YELLOW YELLOW   APPearance CLEAR CLEAR   Specific Gravity, Urine 1.025 1.005 - 1.030   pH 6.0 5.0 - 8.0   Glucose, UA NEGATIVE NEGATIVE mg/dL   Hgb urine dipstick LARGE (A) NEGATIVE   Bilirubin Urine SMALL (A) NEGATIVE   Ketones, ur NEGATIVE NEGATIVE mg/dL   Protein, ur 100 (A) NEGATIVE mg/dL   Nitrite NEGATIVE NEGATIVE   Leukocytes, UA NEGATIVE NEGATIVE  Urine rapid drug screen (hosp performed)     Status: None   Collection Time: 06/29/15  1:10 PM  Result Value Ref Range   Opiates NONE DETECTED NONE DETECTED   Cocaine NONE DETECTED NONE DETECTED   Benzodiazepines NONE DETECTED NONE DETECTED   Amphetamines NONE DETECTED NONE DETECTED   Tetrahydrocannabinol NONE DETECTED NONE DETECTED   Barbiturates NONE DETECTED NONE DETECTED    Comment:        DRUG SCREEN FOR MEDICAL PURPOSES ONLY.  IF CONFIRMATION IS NEEDED FOR ANY PURPOSE, NOTIFY LAB WITHIN 5 DAYS.        LOWEST DETECTABLE LIMITS FOR URINE DRUG SCREEN Drug Class       Cutoff (ng/mL) Amphetamine      1000 Barbiturate      200 Benzodiazepine   601 Tricyclics       093 Opiates          300 Cocaine          300 THC              50   Urine microscopic-add on     Status: None   Collection Time: 06/29/15  1:10 PM  Result Value Ref Range   Squamous Epithelial / LPF NONE SEEN NONE SEEN   WBC, UA NONE SEEN 0 - 5 WBC/hpf   RBC / HPF TOO NUMEROUS TO COUNT 0 - 5 RBC/hpf   Bacteria, UA NONE SEEN NONE SEEN  I-stat chem 8, ed     Status: Abnormal   Collection Time: 06/29/15  3:15 PM  Result Value Ref Range   Sodium 137 135 - 145 mmol/L   Potassium 3.4 (L) 3.5 - 5.1 mmol/L   Chloride 100 (L) 101 - 111 mmol/L   BUN 25 (H) 6 -  20 mg/dL   Creatinine, Ser 1.70 (H) 0.61 - 1.24 mg/dL   Glucose, Bld 82 65 - 99 mg/dL   Calcium, Ion 1.04 (L) 1.13 - 1.30 mmol/L   TCO2 25 0 - 100 mmol/L   Hemoglobin 14.3 13.0 - 17.0 g/dL   HCT 42.0 39.0 - 52.0 %  I-Stat CG4 Lactic Acid, ED     Status: None   Collection Time: 06/29/15  3:31 PM  Result Value Ref Range   Lactic Acid, Venous 0.91 0.5 - 2.0 mmol/L  Comprehensive metabolic panel     Status: Abnormal   Collection  Time: 06/30/15  6:06 AM  Result Value Ref Range   Sodium 138 135 - 145 mmol/L   Potassium 3.4 (L) 3.5 - 5.1 mmol/L   Chloride 102 101 - 111 mmol/L   CO2 26 22 - 32 mmol/L   Glucose, Bld 111 (H) 65 - 99 mg/dL   BUN 23 (H) 6 - 20 mg/dL   Creatinine, Ser 1.89 (H) 0.61 - 1.24 mg/dL   Calcium 8.3 (L) 8.9 - 10.3 mg/dL   Total Protein 6.1 (L) 6.5 - 8.1 g/dL   Albumin 3.4 (L) 3.5 - 5.0 g/dL   AST 27 15 - 41 U/L   ALT 14 (L) 17 - 63 U/L   Alkaline Phosphatase 60 38 - 126 U/L   Total Bilirubin 0.9 0.3 - 1.2 mg/dL   GFR calc non Af Amer 36 (L) >60 mL/min   GFR calc Af Amer 42 (L) >60 mL/min    Comment: (NOTE) The eGFR has been calculated using the CKD EPI equation. This calculation has not been validated in all clinical situations. eGFR's persistently <60 mL/min signify possible Chronic Kidney Disease.    Anion gap 10 5 - 15  CBC     Status: None   Collection Time: 06/30/15  6:06 AM  Result Value Ref Range   WBC 8.0 4.0 - 10.5 K/uL   RBC 4.74 4.22 - 5.81 MIL/uL   Hemoglobin 13.6 13.0 - 17.0 g/dL   HCT 41.1 39.0 - 52.0 %   MCV 86.7 78.0 - 100.0 fL   MCH 28.7 26.0 - 34.0 pg   MCHC 33.1 30.0 - 36.0 g/dL   RDW 12.9 11.5 - 15.5 %   Platelets 174 150 - 400 K/uL    Studies/Results:  HEAD CT global atrophy  BRAIN MRI Exam is motion degraded.   No acute infarct or intracranial hemorrhage.   Moderate to marked global atrophy most notable cerebellum without hydrocephalus.   Mild nonspecific periventricular white matter changes.   No intracranial mass or  osseous destructive lesion to suggest presence of intracranial metastatic disease      Nikeshia Keetch A. Merlene Laughter, M.D.  Diplomate, Tax adviser of Psychiatry and Neurology ( Neurology). 06/30/2015, 5:29 PM

## 2015-06-30 NOTE — Progress Notes (Signed)
Informed by radiology that the system is down and that they are unsure if it will be up and running today. Will relay this message to the doctor.Will continue to monitor

## 2015-07-01 ENCOUNTER — Observation Stay (HOSPITAL_COMMUNITY)
Admit: 2015-07-01 | Discharge: 2015-07-01 | Disposition: A | Discharge status: Home / Self Care | Payer: Commercial Managed Care - HMO | Attending: Internal Medicine | Admitting: Internal Medicine

## 2015-07-01 DIAGNOSIS — G454 Transient global amnesia: Secondary | ICD-10-CM | POA: Diagnosis present

## 2015-07-01 DIAGNOSIS — R462 Strange and inexplicable behavior: Secondary | ICD-10-CM | POA: Diagnosis present

## 2015-07-01 DIAGNOSIS — R4182 Altered mental status, unspecified: Secondary | ICD-10-CM

## 2015-07-01 LAB — BASIC METABOLIC PANEL
Anion gap: 10 (ref 5–15)
BUN: 21 mg/dL — ABNORMAL HIGH (ref 6–20)
CHLORIDE: 101 mmol/L (ref 101–111)
CO2: 27 mmol/L (ref 22–32)
CREATININE: 1.67 mg/dL — AB (ref 0.61–1.24)
Calcium: 8.7 mg/dL — ABNORMAL LOW (ref 8.9–10.3)
GFR, EST AFRICAN AMERICAN: 49 mL/min — AB (ref >60)
GFR, EST NON AFRICAN AMERICAN: 42 mL/min — AB (ref >60)
Glucose, Bld: 116 mg/dL — ABNORMAL HIGH (ref 65–99)
POTASSIUM: 3.8 mmol/L (ref 3.5–5.1)
SODIUM: 138 mmol/L (ref 135–145)

## 2015-07-01 LAB — CBC
HCT: 43.9 % (ref 39.0–52.0)
HEMOGLOBIN: 14.7 g/dL (ref 13.0–17.0)
MCH: 29.1 pg (ref 26.0–34.0)
MCHC: 33.5 g/dL (ref 30.0–36.0)
MCV: 86.8 fL (ref 78.0–100.0)
Platelets: 181 10*3/uL (ref 150–400)
RBC: 5.06 MIL/uL (ref 4.22–5.81)
RDW: 12.9 % (ref 11.5–15.5)
WBC: 8.5 10*3/uL (ref 4.0–10.5)

## 2015-07-01 NOTE — Progress Notes (Signed)
Patient ID: Jesus [name], male   DOB: July 10, 1950, 65 y.o.   MRN: [id number]  [place] A. Merlene Laughter, MD     [email or web address]          Jesus [name] is an 65 y.o. male.   Assessment/Plan:  1. Transient episode of amnesia. The episode seems most consistent with transient global amnesia which is typically monophasic. However, he seems to have somewhat of a odd personality although this is his may be his baseline.   RECOMMENDATION: EEG to evaluate for possible unusual presentation of complex partial seizures. Dementia labs. Carotid duplex Doppler.  The patient reports no complaint. He is overall about the same. He does seem to be an anxious person at baseline. Dr. 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   GENERAL: The patient is eating and is quite focused on this. He seems somewhat bother that I am here to evaluate him while he is eating.  HEENT: Supple. Atraumatic normocephalic.   ABDOMEN: soft  EXTREMITIES: No edema   BACK: Normal.  SKIN: Normal by inspection.   MENTAL STATUS: The patient is awake and alert. He reports that he recognizes me. When asked he reports that I saw him yesterday although this is time I have mat the patient. He thinks that he is at [facility] but reports that he is in [place]. He is oriented to month and the year but thinks that is January 24. The patient seems quite in difference to his overall medical well-being and why he is in the hospital.   CRANIAL NERVES: Pupils are equal, round and reactive to light and accommodation; extra ocular movements are full, there is no significant nystagmus; visual fields are full; upper and lower facial muscles are normal in strength and symmetric, there is no flattening of the nasolabial folds; tongue is midline; uvula is midline; shoulder elevation is  normal.  MOTOR: Normal tone, bulk and strength in the arms; no pronator drift. Examination of the legs shows normal bulk and tone but exact strength is uncertain but at least 4+/5. He seemed to have some motor impersistence.  COORDINATION: Left finger to nose is normal, right finger to nose is normal, No rest tremor; no intention tremor; no postural tremor; no bradykinesia.  REFLEXES: Deep tendon reflexes are symmetrical and normal. Babinski reflexes are flexor bilaterally.   SENSATION: Normal to light touch.     DR Maralyn Sago NOTE -I called the patient's sister. She reports that the patient has a history of bizarre and strange behavior usually coinciding with stressors such as with his divorce and with the death of their parents. Over time, his behavior normalizes. She has tried to get him help in the past, but he refused to go. He has had such bizarre behavior such as standing up in the middle of a church service to ask someone a question; running around the church for no reason; falls asleep in the middle of a sentence. 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     Objective: Vital signs in last 24 hours: Temp:  [97.3 F (36.3 C)-98.9 F (37.2 C)] 98.7 F (37.1 C) (01/10 1500) Pulse Rate:  [75-80] 80 (01/10 1500) Resp:  [17-18] 18 (01/10 1500) BP: (152-168)/(89-99) 155/89 mmHg (01/10 1500) SpO2:  [96 %-99 %] 97 % (  01/10 1500)  Intake/Output from previous day: 01/09 0701 - 01/10 0700 In: 1427.9 [P.O.:240; I.V.:1187.9] Out: -  Intake/Output this shift: Total I/O In: 480 [P.O.:480] Out: -  Nutritional status: Diet regular Room service appropriate?: Yes; Fluid consistency:: Thin   Lab Results: Results for orders placed or performed during the hospital encounter of 06/29/15 (from the past 48 hour(s))  Comprehensive metabolic panel     Status: Abnormal   Collection Time: 06/30/15   6:06 AM  Result Value Ref Range   Sodium 138 135 - 145 mmol/L   Potassium 3.4 (L) 3.5 - 5.1 mmol/L   Chloride 102 101 - 111 mmol/L   CO2 26 22 - 32 mmol/L   Glucose, Bld 111 (H) 65 - 99 mg/dL   BUN 23 (H) 6 - 20 mg/dL   Creatinine, Ser 5.24 (H) 0.61 - 1.24 mg/dL   Calcium 8.3 (L) 8.9 - 10.3 mg/dL   Total Protein 6.1 (L) 6.5 - 8.1 g/dL   Albumin 3.4 (L) 3.5 - 5.0 g/dL   AST 27 15 - 41 U/L   ALT 14 (L) 17 - 63 U/L   Alkaline Phosphatase 60 38 - 126 U/L   Total Bilirubin 0.9 0.3 - 1.2 mg/dL   GFR calc non Af Amer 36 (L) >60 mL/min   GFR calc Af Amer 42 (L) >60 mL/min    Comment: (NOTE) The eGFR has been calculated using the CKD EPI equation. This calculation has not been validated in all clinical situations. eGFR's persistently <60 mL/min signify possible Chronic Kidney Disease.    Anion gap 10 5 - 15  CBC     Status: None   Collection Time: 06/30/15  6:06 AM  Result Value Ref Range   WBC 8.0 4.0 - 10.5 K/uL   RBC 4.74 4.22 - 5.81 MIL/uL   Hemoglobin 13.6 13.0 - 17.0 g/dL   HCT 79.9 80.0 - 12.3 %   MCV 86.7 78.0 - 100.0 fL   MCH 28.7 26.0 - 34.0 pg   MCHC 33.1 30.0 - 36.0 g/dL   RDW 93.5 94.0 - 90.5 %   Platelets 174 150 - 400 K/uL  CBC     Status: None   Collection Time: 07/01/15  5:50 AM  Result Value Ref Range   WBC 8.5 4.0 - 10.5 K/uL   RBC 5.06 4.22 - 5.81 MIL/uL   Hemoglobin 14.7 13.0 - 17.0 g/dL   HCT 02.5 61.5 - 48.8 %   MCV 86.8 78.0 - 100.0 fL   MCH 29.1 26.0 - 34.0 pg   MCHC 33.5 30.0 - 36.0 g/dL   RDW 45.7 33.4 - 48.3 %   Platelets 181 150 - 400 K/uL  Basic metabolic panel     Status: Abnormal   Collection Time: 07/01/15  5:50 AM  Result Value Ref Range   Sodium 138 135 - 145 mmol/L   Potassium 3.8 3.5 - 5.1 mmol/L   Chloride 101 101 - 111 mmol/L   CO2 27 22 - 32 mmol/L   Glucose, Bld 116 (H) 65 - 99 mg/dL   BUN 21 (H) 6 - 20 mg/dL   Creatinine, Ser 0.15 (H) 0.61 - 1.24 mg/dL   Calcium 8.7 (L) 8.9 - 10.3 mg/dL   GFR calc non Af Amer 42 (L) >60  mL/min   GFR calc Af Amer 49 (L) >60 mL/min    Comment: (NOTE) The eGFR has been calculated using the CKD EPI equation. This calculation has not been validated in all clinical  situations. eGFR's persistently <60 mL/min signify possible Chronic Kidney Disease.    Anion gap 10 5 - 15    Lipid Panel No results for input(s): CHOL, TRIG, HDL, CHOLHDL, VLDL, LDLCALC in the last 72 hours.  Studies/Results:   Medications:  Scheduled Meds: . enoxaparin (LOVENOX) injection  40 mg Subcutaneous Q24H   Continuous Infusions: . sodium chloride 75 mL/hr at 07/01/15 1138   PRN Meds:.LORazepam, ondansetron **OR** ondansetron (ZOFRAN) IV     LOS: 2 days   Carmella Kees A. Merlene Laughter, M.D.  Diplomate, Tax adviser of Psychiatry and Neurology ( Neurology).

## 2015-07-01 NOTE — Progress Notes (Addendum)
[facility] PROGRESS NOTE  ANANIA BARBIE [id number] DOB: 1950-11-08 DOA: 06/29/2015 PCP: Wende Neighbors, MD    Code Status: Full code Family Communication: Discussed with sister, Ms. Stone. Disposition Plan: Discharge when clinically appropriate   Consultants:  Neurology  Oncology  Procedures:  EEG ordered, pending  Antibiotics:  None  HPI/Subjective: Patient has no complaints of headache, nausea, vomiting, or abdominal pain.  Objective: Filed Vitals:   06/30/15 2002 07/01/15 0632  BP: 152/99 168/90  Pulse: 75 79  Temp: 97.3 F (36.3 C) 98.9 F (37.2 C)  Resp: 17 18   oxygen saturation 98% on room air  Intake/Output Summary (Last 24 hours) at 07/01/15 1120 Last data filed at 06/30/15 1838  Gross per 24 hour  Intake 1187.92 ml  Output      0 ml  Net 1187.92 ml   Filed Weights   06/29/15 1700  Weight: 71.94 kg (158 lb 9.6 oz)    Exam:   General:  65 year old Caucasian man sitting up in bed, in no acute distress.  Cardiovascular: S1, S2, with a soft systolic murmur. Right chest wall with Port-A-Cath in place.  Respiratory: Clear to auscultation bilaterally.  Abdomen: Positive bowel sounds, soft, minimal hypogastric tenderness without rebound, guarding, or distention.  Musculoskeletal/extremities: No pedal edema. No acute rigid joints; but mild excoriation on the left knee.  Neurologic/psychiatric: He has a flat affect. He is alert and oriented to himself, hospital and year. He fidgets with the remote control. His speech is clear, but he avoids consistent eye contact. He follows commands, but generally has to be told more than once.  Data Reviewed: Basic Metabolic Panel:  Recent Labs Lab 06/29/15 1055 06/29/15 1515 06/30/15 0606 07/01/15 0550  NA 137 137 138 138  K 3.6 3.4* 3.4* 3.8  CL 99* 100* 102 101  CO2 25  --  26 27  GLUCOSE 222* 82 111* 116*  BUN 28* 25* 23* 21*  CREATININE 2.14* 1.70* 1.89* 1.67*  CALCIUM 9.0  --  8.3* 8.7*    Liver Function Tests:  Recent Labs Lab 06/29/15 1055 06/30/15 0606  AST 29 27  ALT 16* 14*  ALKPHOS 56 60  BILITOT 1.0 0.9  PROT 6.7 6.1*  ALBUMIN 3.7 3.4*   No results for input(s): LIPASE, AMYLASE in the last 168 hours.  Recent Labs Lab 06/29/15 1300  AMMONIA 19   CBC:  Recent Labs Lab 06/29/15 1055 06/29/15 1515 06/30/15 0606 07/01/15 0550  WBC 7.3  --  8.0 8.5  HGB 13.5 14.3 13.6 14.7  HCT 39.9 42.0 41.1 43.9  MCV 86.4  --  86.7 86.8  PLT 179  --  174 181   Cardiac Enzymes: No results for input(s): CKTOTAL, CKMB, CKMBINDEX, TROPONINI in the last 168 hours. BNP (last 3 results) No results for input(s): BNP in the last 8760 hours.  ProBNP (last 3 results) No results for input(s): PROBNP in the last 8760 hours.  CBG: No results for input(s): GLUCAP in the last 168 hours.  No results found for this or any previous visit (from the past 240 hour(s)).   Studies: Dg Chest 1 View  06/29/2015  CLINICAL DATA:  Altered mental status.  History of bladder cancer. EXAM: CHEST 1 VIEW COMPARISON:  None. FINDINGS: Study is hypoinspiratory with crowding of the perihilar bronchovascular markings. Pulmonary arteries somewhat prominent in appearance raising the possibility of a chronic underlying pulmonary artery hypertension. Lungs are otherwise clear. No evidence of pneumonia. No pleural effusion seen. No pneumothorax. Heart  size is normal. Overall cardiomediastinal silhouette is within normal limits in size and configuration. Right chest wall Port-A-Cath appears well positioned with tip projected over the lower SVC. No acute osseous abnormality seen. IMPRESSION: Lungs are clear and there is no evidence of acute cardiopulmonary abnormality. Questionable pulmonary artery hypertension. Hypoinspiratory changes as described above. Electronically Signed   By: Franki Cabot M.D.   On: 06/29/2015 13:10   Ct Head Wo Contrast  06/29/2015  CLINICAL DATA:  Altered mental status today.  History of bladder carcinoma. EXAM: CT HEAD WITHOUT CONTRAST TECHNIQUE: Contiguous axial images were obtained from the base of the skull through the vertex without intravenous contrast. COMPARISON:  Brain MRI 08/12/2014. FINDINGS: The brain is atrophic. No evidence of acute abnormality including hemorrhage, infarct, mass lesion, mass effect, midline shift or abnormal extra-axial fluid collection is identified. There is no hydrocephalus or pneumocephalus. Imaged paranasal sinuses and mastoid air cells are clear. IMPRESSION: No acute abnormality. Cortical atrophy. Electronically Signed   By: Inge Rise M.D.   On: 06/29/2015 13:20   Mr Jeri Cos [id number] Contrast  06/30/2015  CLINICAL DATA:  65 year old male with bladder cancer recently starting immunotherapy presenting with altered mental status/global amnesia. Subsequent encounter. EXAM: MRI HEAD WITHOUT AND WITH CONTRAST TECHNIQUE: Multiplanar, multiecho pulse sequences of the brain and surrounding structures were obtained without and with intravenous contrast. CONTRAST:  68mL MULTIHANCE GADOBENATE DIMEGLUMINE 529 MG/ML IV SOLN COMPARISON:  06/29/2015 CT.  08/12/2014 MR. FINDINGS: Exam is motion degraded. No acute infarct or intracranial hemorrhage. Moderate to marked global atrophy most notable cerebellum without hydrocephalus. Mild nonspecific periventricular white matter changes. No intracranial mass or osseous destructive lesion to suggest presence of intracranial metastatic disease. Major intracranial vascular structures are patent. Cervical medullary junction, pituitary region, pineal region and orbital structures unremarkable. Mucosal thickening ethmoid sinus air cells. IMPRESSION: Exam is motion degraded. No acute infarct or intracranial hemorrhage. Moderate to marked global atrophy most notable cerebellum without hydrocephalus. Mild nonspecific periventricular white matter changes. No intracranial mass or osseous destructive lesion to suggest presence of  intracranial metastatic disease. Electronically Signed   By: Genia Del M.D.   On: 06/30/2015 13:34    Scheduled Meds: . enoxaparin (LOVENOX) injection  40 mg Subcutaneous Q24H   Continuous Infusions: . sodium chloride 125 mL/hr at 06/30/15 1543   Assessment and plan:  Principal Problem:   Transient global amnesia Active Problems:   Acute encephalopathy   Bizarre behavior   AKI (acute kidney injury) ([place])   Metastatic urothelial carcinoma ([place])   Depression with anxiety   1. Acute encephalopathy/bizarre behavior with transient global amnesia. The patient presented to the ED after being found with confusion and global amnesia as he was found approximately half a mile from his home carrying a basket of laundry in the middle of the road. He has no recollection of these events. He denies starting any new medications or any changes in his medications. He recently was restarted on chemotherapy for progression of his bladder cancer. On admission, CT scan of his head revealed no acute abnormalities. His ammonia level was within normal limits. His ABG was virtually unremarkable. His urine drug screen was negative. Alcohol level was negative. His TSH on 06/24/15 was within normal limits. Additional studies revealed an unremarkable MRI of the brain. Vitamin B12 level not deficient. -EEG ordered and is pending. Dr. Merlene Laughter was consulted and ordered additional laboratory studies, now pending. -His oncologist, Dr. Whitney Muse does not know of any CNS toxicities from Atezolizumab. -I called  the patient's sister. She reports that the patient has a history of bizarre and strange behavior usually coinciding with stressors such as with his divorce and with the death of their parents. Over time, his behavior normalizes. She has tried to get him help in the past, but he refused to go. He has had such bizarre behavior such as standing up in the middle of a church service to ask someone a question; running around  the church for no reason; falls asleep in the middle of a sentence. 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 -Given this history, will order a psychiatric consultation. History of depression with anxiety. He is treated with as needed lorazepam. He denies depression currently. He denies suicidal ideation. Apparently, he has had treatment in a psychiatric facility in the past per his sister, but the exact diagnosis is unknown.  Acute kidney injury. Patient's renal function was within normal limits 5-6 months ago. It has gotten worse progressively. Over the past 4 weeks, his creatinine has increased from 1.4 to 2.14 on admission. Etiology likely from prerenal azotemia and possibly chemotherapy. -He was started on IV fluids for hydration. His creatinine has improved to 1.67. We'll continue IV fluids with a small decrease in the rate due to his elevated blood pressure.  Metastatic urothelial carcinoma. Patient is followed by Dr. Whitney Muse. He has stage IV urothelial carcinoma. He received chemotherapy finishing April 2016 and apparently had metabolic remission on PET imaging in May 2016. He now has evidence of metastatic disease. Chemotherapy was restarted last week. He also underwent TURBT by Dr. Jeffie Pollock on 06/19/15. -Oncology was consulted. Dr. Whitney Muse plans to discuss possible CNS toxicity of his chemotherapeutic agent with Dr. Alen Blew. High-dose steroids are being considered. -Given the history that I gathered from the patient's sister, his bizarre behavior is not new.  Elevated blood pressure.  Patient has no history of hypertension per his recollection. The elevation may be due to IV fluids. We'll continue to monitor. Will order IV hydralazine for systolic blood pressure [id number] or greater.  Mild hypokalemia. Improved following oral potassium chloride. We'll continue to monitor.    Time spent: 35  minutes    [facility] Pager [phone]. If 7PM-7AM, please contact night-coverage at [email or web address], password [facility] 07/01/2015, 11:20 AM  LOS: 2 days

## 2015-07-01 NOTE — Progress Notes (Signed)
EEG Completed; Results Pending  

## 2015-07-01 NOTE — Consult Note (Signed)
Telepsych Consultation   Reason for Consult:  Altered Mental Status Referring Physician:  [facility] MD Patient Identification: Jesus [name] MRN:  [id number] Principal Diagnosis: Transient global amnesia Diagnosis:   Patient Active Problem List   Diagnosis Date Noted  . Transient global amnesia [G45.4] 07/01/2015  . Bizarre behavior [R46.2] 07/01/2015  . Altered mental status [R41.82]   . AKI (acute kidney injury) ([place]) [N17.9] 06/30/2015  . Depression with anxiety [F41.8] 06/30/2015  . Acute encephalopathy [G93.40] 06/29/2015  . Iron deficiency anemia due to chronic blood loss [D50.0] 08/02/2014  . Metastatic urothelial carcinoma ([place]) [C79.10] 07/16/2014    Total Time spent with patient: 30 minutes  Subjective:   Jesus [name] is a 65 y.o. male patient admitted with altered mental status after being recently started on immunotherapy for altered mental status. Patient states "I just sat down the basket to rest. I've been fine lately. Nothing has changed since I started treatment. I am not depressed. I had some treatment for that years ago like around 2007. I'm not suicidal. I'm doing well."   HPI:    Jesus [name] is a 65 year old male with a past history of bladder cancer who was recently started on immunotherapy that started on 06/24/2015 according to oncology notes. Since admission the patient has been a poor historian regarding his recent symptoms. The patient remembers being picked up by the police but is unable to tell this writer why. Notes in epic indicate that he was found a mile from his home carrying a basket of laundry in the middle of the road. The patient was brought to the ED by a firefighter. Patient has a difficult time participating in assessment today and does not make eye contact with Probation officer. He is observed rolling around in bed at times exposing his backside requiring the sitter to cover him up. At times he is not able to provide an answer to assessment questions and  appears confused. His sister per collateral information recorded reported patient was doing well until a few days ago. She also reported he has exhibited bizarre behaviors in the past that coincide with situational stressors that usually resolve with time.  The patient does deny that is is feeling depressed, manic and denies any psychotic symptoms. There is no evidence that the patient is responding to any internal stimuli. His urine drug screen is negative for any substances along with a negative alcohol level. Jesus [name] has poor focus during the assessment and is unable to spell WORLD backwards. He is not able to meaningfully participate in a formal psychiatric assessment at this time. Patient denies that he is under any current psychiatric treatment and reports possibly taking Trazodone in the past for sleep issues. His oncologist is unaware of any CNS toxicities that would result due to patient taking Atezolizumab. A Neurology consult completed today is currently pending the results of an EEG.    HPI Elements:   Location:  altered mental status. Quality:  Bizarre behaviors . Severity:  Moderate. Timing:  last few days. Duration:  Episodic. Context:  Recently started treated for stage 4 bladder cancer per oncology .  Past Medical History:  Past Medical History  Diagnosis Date  . High cholesterol   . Depression     hx of   . Arthritis   . Bladder cancer ([place])   . Metastatic urothelial carcinoma ([place]) 07/16/2014    Past Surgical History  Procedure Laterality Date  . Knee surgery    . Tonsillectomy    .  Transurethral resection of bladder tumor with gyrus (turbt-gyrus) N/A 07/16/2014    Procedure: TRANSURETHRAL RESECTION OF BLADDER TUMOR WITH GYRUS (TURBT-GYRUS);  Surgeon: Malka So, MD;  Location: [facility];  Service: Urology;  Laterality: N/A;  . Cystoscopy with biopsy N/A 07/16/2014    Procedure: CYSTOSCOPY WITH PROSTATE ULTRASOUND AND BIOPSY;  Surgeon: Malka So, MD;  Location: [facility];   Service: Urology;  Laterality: N/A;  . Transurethral resection of bladder tumor N/A 06/19/2015    Procedure: TRANSURETHRAL RESECTION OF BLADDER TUMOR (TURBT);  Surgeon: Irine Seal, MD;  Location: [facility];  Service: Urology;  Laterality: N/A;   Family History:  Family History  Problem Relation Age of Onset  . Cancer Mother   . Stroke Father   . Cancer Sister    Social History:  History  Alcohol Use No     History  Drug Use No    Social History   Social History  . Marital Status: Single    Spouse Name: N/A  . Number of Children: N/A  . Years of Education: N/A   Social History Main Topics  . Smoking status: Never Smoker   . Smokeless tobacco: Never Used  . Alcohol Use: No  . Drug Use: No  . Sexual Activity: Not Asked     Comment: Divorced. 1 son who lives in [place].  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 Non smoker, no tobacco use. No significant ETOH use   Other Topics Concern  . None   Social History Narrative   Additional Social History:                          Allergies:  No Known Allergies  Labs:  Results for orders placed or performed during the hospital encounter of 06/29/15 (from the past 48 hour(s))  Comprehensive metabolic panel     Status: Abnormal   Collection Time: 06/30/15  6:06 AM  Result Value Ref Range   Sodium 138 135 - 145 mmol/L   Potassium 3.4 (L) 3.5 - 5.1 mmol/L   Chloride 102 101 - 111 mmol/L   CO2 26 22 - 32 mmol/L   Glucose, Bld 111 (H) 65 - 99 mg/dL   BUN 23 (H) 6 - 20 mg/dL   Creatinine, Ser 1.89 (H) 0.61 - 1.24 mg/dL   Calcium 8.3 (L) 8.9 - 10.3 mg/dL   Total Protein 6.1 (L) 6.5 - 8.1 g/dL   Albumin 3.4 (L) 3.5 - 5.0 g/dL   AST 27 15 - 41 U/L   ALT 14 (L) 17 - 63 U/L   Alkaline Phosphatase 60 38 - 126 U/L   Total Bilirubin 0.9 0.3 - 1.2 mg/dL   GFR calc non Af Amer 36 (L) >60 mL/min   GFR calc Af Amer 42 (L) >60 mL/min    Comment: (NOTE) The eGFR has been calculated using the CKD EPI equation. This  calculation has not been validated in all clinical situations. eGFR's persistently <60 mL/min signify possible Chronic Kidney Disease.    Anion gap 10 5 - 15  CBC     Status: None   Collection Time: 06/30/15  6:06 AM  Result Value Ref Range   WBC 8.0 4.0 - 10.5 K/uL   RBC 4.74 4.22 - 5.81 MIL/uL   Hemoglobin 13.6 13.0 - 17.0 g/dL   HCT 41.1 39.0 - 52.0 %   MCV 86.7 78.0 - 100.0 fL   MCH 28.7 26.0 -  34.0 pg   MCHC 33.1 30.0 - 36.0 g/dL   RDW 12.9 11.5 - 15.5 %   Platelets 174 150 - 400 K/uL  CBC     Status: None   Collection Time: 07/01/15  5:50 AM  Result Value Ref Range   WBC 8.5 4.0 - 10.5 K/uL   RBC 5.06 4.22 - 5.81 MIL/uL   Hemoglobin 14.7 13.0 - 17.0 g/dL   HCT 43.9 39.0 - 52.0 %   MCV 86.8 78.0 - 100.0 fL   MCH 29.1 26.0 - 34.0 pg   MCHC 33.5 30.0 - 36.0 g/dL   RDW 12.9 11.5 - 15.5 %   Platelets 181 150 - 400 K/uL  Basic metabolic panel     Status: Abnormal   Collection Time: 07/01/15  5:50 AM  Result Value Ref Range   Sodium 138 135 - 145 mmol/L   Potassium 3.8 3.5 - 5.1 mmol/L   Chloride 101 101 - 111 mmol/L   CO2 27 22 - 32 mmol/L   Glucose, Bld 116 (H) 65 - 99 mg/dL   BUN 21 (H) 6 - 20 mg/dL   Creatinine, Ser 1.67 (H) 0.61 - 1.24 mg/dL   Calcium 8.7 (L) 8.9 - 10.3 mg/dL   GFR calc non Af Amer 42 (L) >60 mL/min   GFR calc Af Amer 49 (L) >60 mL/min    Comment: (NOTE) The eGFR has been calculated using the CKD EPI equation. This calculation has not been validated in all clinical situations. eGFR's persistently <60 mL/min signify possible Chronic Kidney Disease.    Anion gap 10 5 - 15    Vitals: Blood pressure 155/89, pulse 80, temperature 98.7 F (37.1 C), temperature source Oral, resp. rate 18, height '5\' 10"'$  (1.778 m), weight 71.94 kg (158 lb 9.6 oz), SpO2 97 %.  Risk to Self: Is patient at risk for suicide?: No Risk to Others:   Prior Inpatient Therapy:   Prior Outpatient Therapy:    Current Facility-Administered Medications  Medication Dose Route  Frequency Provider Last Rate Last Dose  . 0.9 %  sodium chloride infusion   Intravenous Continuous Rexene Alberts, MD 75 mL/hr at 07/01/15 1138    . enoxaparin (LOVENOX) injection 40 mg  40 mg Subcutaneous Q24H Nimish C Anastasio Champion, MD   40 mg at 06/30/15 1838  . LORazepam (ATIVAN) injection 0.5 mg  0.5 mg Intravenous PRN Rexene Alberts, MD      . ondansetron (ZOFRAN) tablet 4 mg  4 mg Oral Q6H PRN Nimish C Gosrani, MD       Or  . ondansetron (ZOFRAN) injection 4 mg  4 mg Intravenous Q6H PRN Nimish Luther Parody, MD        Musculoskeletal: Strength & Muscle Tone: Unable to assess via tele-psych machine Gait & Station: Unable to assess via tele-psych machine Patient leans: Unable to assess via tele-psych machine  Psychiatric Specialty Exam: Physical Exam  Review of Systems  Unable to perform ROS: medical condition  Psychiatric/Behavioral: Negative for depression, suicidal ideas, hallucinations, memory loss and substance abuse. The patient is not nervous/anxious and does not have insomnia.     Blood pressure 155/89, pulse 80, temperature 98.7 F (37.1 C), temperature source Oral, resp. rate 18, height '5\' 10"'$  (1.778 m), weight 71.94 kg (158 lb 9.6 oz), SpO2 97 %.Body mass index is 22.76 kg/(m^2).  General Appearance: Bizarre  Eye Contact::  None  Speech:  Slow  Volume:  Normal  Mood:  Irritable  Affect:  Flat  Thought Process:  Coherent  Orientation:  Full (Time, Place, and Person)  Thought Content:  WDL  Suicidal Thoughts:  No  Homicidal Thoughts:  No  Memory:  Immediate;   Poor Recent;   Poor Remote;   Poor  Judgement:  Impaired  Insight:  Lacking  Psychomotor Activity:  Restlessness  Concentration:  Poor  Recall:  Poor  Fund of Knowledge:Poor  Language: Fair  Akathisia:  No  Handed:  Right  AIMS (if indicated):     Assets:  Agricultural consultant Leisure Time Resilience Social Support  ADL's:  Impaired  Cognition: Impaired,  Mild and Moderate   Sleep:      Medical Decision Making: Review of Psycho-Social Stressors (1), Review or order clinical lab tests (1) and Review of Medication Regimen & Side Effects (2)  Plan:  No evidence of imminent risk to self or others at present.   Patient does not meet criteria for psychiatric inpatient admission. Disposition:   Patient's symptoms do not appear to be of psychiatric origin. The presentation appears to be consistent with problem number one listed per Internal Medicine progress note dated 07/01/2015 of Acute encephalopathy/bizarre behavior with transient global amnesia. An EEG is currently pending after Neurology consult to rule out complex partial seizures. Would recommend to continue evaluating for an organic explanation for his symptoms.   Elmarie Shiley, NP-C 07/01/2015 5:07 PM

## 2015-07-01 NOTE — Care Management Note (Signed)
Case Management Note  Patient Details  Name: [name] MRN: [id number] Date of Birth: [birth year]  Subjective/Objective:                  Admitted for AMS. Pt is from home, lives alone. Pt has sister who lives in town and has son who lives in [place]. Pt ind performs ADL's but will require 24/7 supervision at DC. Discussed DC plan with both patient and son. Pt's son understands he does not have skilled need for SNF placement and he will have to pay for ALF. Pt's son verbalized understanding and seems willing to do what ever is best for his dad.   Action/Plan: CSW made aware of DC needs and will f/u with pt's son after TTS consult is complete. CM will cont to follow.   Expected Discharge Date:  07/01/15               Expected Discharge Plan:  Assisted Living / Rest Home  In-House Referral:  Clinical Social Work  Discharge planning Services  CM Consult  Post Acute Care Choice:  NA Choice offered to:  NA  DME Arranged:    DME Agency:     HH Arranged:    HH Agency:     Status of Service:  In process, will continue to follow  Medicare Important Message Given:    Date Medicare IM Given:    Medicare IM give by:    Date Additional Medicare IM Given:    Additional Medicare Important Message give by:     If discussed at [place] of Stay Meetings, dates discussed:    Additional Comments:  Sherald Barge, RN 07/01/2015, 3:44 PM

## 2015-07-01 NOTE — Procedures (Signed)
  [place] A. Merlene Laughter, MD     [email or web address]           HISTORY: The patient presents with an episode of amnesia and confusion along with amnesia. The spells suspicious for seizures.  MEDICATIONS: Scheduled Meds: . enoxaparin (LOVENOX) injection  40 mg Subcutaneous Q24H   Continuous Infusions: . sodium chloride 75 mL/hr at 07/01/15 1751   PRN Meds:.LORazepam, ondansetron **OR** ondansetron (ZOFRAN) IV  Prior to Admission medications   Medication Sig Start Date End Date Taking? Authorizing Provider  HYDROcodone-acetaminophen (NORCO) 5-325 MG tablet Take 1 tablet by mouth every 6 (six) hours as needed for moderate pain. Patient not taking: Reported on 06/24/2015 06/19/15   Irine Seal, MD  LORazepam (ATIVAN) 0.5 MG tablet Take 0.5 mg by mouth every 6 (six) hours as needed for anxiety (nausea). Reported on 06/24/2015    Historical Provider, MD  ondansetron (ZOFRAN) 8 MG tablet Take by mouth every 12 (twelve) hours as needed for nausea or vomiting. Reported on 06/24/2015    Historical Provider, MD  predniSONE (DELTASONE) 10 MG tablet Take 7 tablets (70mg  total) daily @ the onset of loose stools/diarrhea & contact [place]. Patient not taking: Reported on 06/24/2015 06/20/15   Manon Hilding Kefalas, PA-C  prochlorperazine (COMPAZINE) 10 MG tablet Take 10 mg by mouth every 6 (six) hours as needed for nausea or vomiting. Reported on 06/24/2015    Historical Provider, MD      ANALYSIS: A 16 channel recording using standard 10 20 measurements is conducted for 21 minutes. There is a well-formed posterior dominant rhythm of 9 Hz which attenuates eye opening. There is beta activity observed in the frontal areas. The patient did have some evidence of frontal intermittent rhythmic delta activity during the initial part of the recording. There is prominent K complexes and sleep spindles observed indicating stage II non-REM sleep. Photic stimulation and hyperventilation are not carried  out. There is no focal or lateral slowing. There is no epileptiform activity is observed.    IMPRESSION: The recording shows episodes of frontal intermittent rhythmic delta activity typically seen in metabolic encephalopathies. Otherwise, this an unremarkable recording of the awake and sleep states.      Julionna Marczak A. Merlene Laughter, M.D.  Diplomate, Tax adviser of Psychiatry and Neurology ( Neurology).

## 2015-07-02 ENCOUNTER — Encounter (HOSPITAL_COMMUNITY): Payer: Self-pay | Admitting: *Deleted

## 2015-07-02 DIAGNOSIS — G454 Transient global amnesia: Secondary | ICD-10-CM | POA: Diagnosis not present

## 2015-07-02 DIAGNOSIS — N179 Acute kidney failure, unspecified: Secondary | ICD-10-CM | POA: Diagnosis not present

## 2015-07-02 DIAGNOSIS — R462 Strange and inexplicable behavior: Secondary | ICD-10-CM

## 2015-07-02 LAB — BASIC METABOLIC PANEL
ANION GAP: 7 (ref 5–15)
BUN: 16 mg/dL (ref 6–20)
CO2: 27 mmol/L (ref 22–32)
Calcium: 8.3 mg/dL — ABNORMAL LOW (ref 8.9–10.3)
Chloride: 104 mmol/L (ref 101–111)
Creatinine, Ser: 1.67 mg/dL — ABNORMAL HIGH (ref 0.61–1.24)
GFR, EST AFRICAN AMERICAN: 49 mL/min — AB (ref >60)
GFR, EST NON AFRICAN AMERICAN: 42 mL/min — AB (ref >60)
Glucose, Bld: 110 mg/dL — ABNORMAL HIGH (ref 65–99)
POTASSIUM: 3.6 mmol/L (ref 3.5–5.1)
SODIUM: 138 mmol/L (ref 135–145)

## 2015-07-02 LAB — HIV ANTIBODY (ROUTINE TESTING W REFLEX): HIV SCREEN 4TH GENERATION: NONREACTIVE

## 2015-07-02 LAB — RPR: RPR Ser Ql: NONREACTIVE

## 2015-07-02 LAB — HOMOCYSTEINE: Homocysteine: 10 umol/L (ref 0.0–15.0)

## 2015-07-02 MED ORDER — PROCHLORPERAZINE MALEATE 10 MG PO TABS: 10.0000 mg | ORAL_TABLET | Freq: Four times a day (QID) | ORAL | Status: DC | PRN

## 2015-07-02 MED ORDER — ONDANSETRON HCL 8 MG PO TABS: 8.0000 mg | ORAL_TABLET | Freq: Three times a day (TID) | ORAL | Status: AC | PRN

## 2015-07-02 NOTE — NC FL2 (Signed)
[place] LEVEL OF CARE SCREENING TOOL     IDENTIFICATION  Patient Name: Jesus [name] Birthdate: 1951/01/30 Sex: male Admission Date (Current Location): 06/29/2015  [facility] and [place] Number:  [employer] and Address:  [facility] [address], [place]      Provider Number: [id number]  Attending Physician Name and Address:  Koleen Nimrod Acost*  Relative Name and Phone Number:       Current Level of Care: Hospital Recommended Level of Care: Family Care Home Prior Approval Number:    Date Approved/Denied:   PASRR Number:    Discharge Plan: Other (Comment) (ALF)    Current Diagnoses: Patient Active Problem List   Diagnosis Date Noted  . 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 Bizarre behavior 07/01/2015  . Altered mental status   . AKI (acute kidney injury) ([place]) 06/30/2015  . Depression with anxiety 06/30/2015  . Acute encephalopathy 06/29/2015  . Iron deficiency anemia due to chronic blood loss 08/02/2014  . Metastatic urothelial carcinoma ([place]) 07/16/2014    Orientation RESPIRATION BLADDER Height & Weight    Self  Normal Incontinent 5\' 10"  (177.8 cm) 158 lbs.  BEHAVIORAL SYMPTOMS/MOOD NEUROLOGICAL BOWEL NUTRITION STATUS  Other (Comment) (see notes)  (possible seizures- new) Incontinent Diet (Regular)  AMBULATORY STATUS COMMUNICATION OF NEEDS Skin   Limited Assist Verbally Normal                       Personal Care Assistance Level of Assistance  Bathing, Feeding, Dressing Bathing Assistance: Limited assistance Feeding assistance: Limited assistance Dressing Assistance: Limited assistance     Functional Limitations Info  Sight, Hearing, Speech Sight Info: Impaired Hearing Info: Adequate Speech Info: Adequate    SPECIAL CARE FACTORS FREQUENCY                       Contractures      Additional Factors Info  Psychotropic Code Status Info: Full code Allergies Info: No known  allergies Psychotropic Info: Ativan         Current Medications (07/02/2015):  This is the current hospital active medication list Current Facility-Administered Medications  Medication Dose Route Frequency Provider Last Rate Last Dose  . 0.9 %  sodium chloride infusion   Intravenous Continuous Rexene Alberts, MD 75 mL/hr at 07/02/15 0850    . enoxaparin (LOVENOX) injection 40 mg  40 mg Subcutaneous Q24H Nimish C Gosrani, MD   40 mg at 07/01/15 1751  . LORazepam (ATIVAN) injection 0.5 mg  0.5 mg Intravenous PRN Rexene Alberts, MD      . ondansetron [facility]) tablet 4 mg  4 mg Oral Q6H PRN Nimish Luther Parody, MD       Or  . ondansetron (ZOFRAN) injection 4 mg  4 mg Intravenous Q6H PRN Nimish Luther Parody, MD         Discharge Medications: Medication List    STOP taking these medications       HYDROcodone-acetaminophen 5-325 MG tablet  Commonly known as: NORCO     LORazepam 0.5 MG tablet  Commonly known as: ATIVAN     predniSONE 10 MG tablet  Commonly known as: DELTASONE      TAKE these medications       ondansetron 8 MG tablet  Commonly known as: ZOFRAN  Take by mouth every 12 (twelve) hours as needed for nausea or vomiting. Reported on 06/24/2015     prochlorperazine 10 MG  tablet  Commonly known as: COMPAZINE  Take 10 mg by mouth every 6 (six) hours as needed for nausea or vomiting. Reported on 06/24/2015           Relevant Imaging Results:  Relevant Lab Results:   Additional Information Chemo at [place], [place], [place]

## 2015-07-02 NOTE — Care Management Note (Signed)
Case Management Note  Patient Details  Name: Jesus [name] MRN: [id number] Date of Birth: May 09, 1951   Expected Discharge Date:  07/01/15               Expected Discharge Plan:  Assisted Living / Rest Home  In-House Referral:  Clinical Social Work  Discharge planning Services  CM Consult  Post Acute Care Choice:  NA Choice offered to:  NA  DME Arranged:    DME Agency:     HH Arranged:    [place] Agency:     Status of Service:  Completed, signed off  Medicare Important Message Given:    Date Medicare IM Given:    Medicare IM give by:    Date Additional Medicare IM Given:    Additional Medicare Important Message give by:     If discussed at [place] of Stay Meetings, dates discussed:    Additional Comments: Pt is discharging today to ALF. Son is in room, confusion continues. No CM needs at this time.  Sherald Barge, RN 07/02/2015, 3:20 PM

## 2015-07-02 NOTE — NC FL2 (Deleted)
  [place] LEVEL OF CARE SCREENING TOOL     IDENTIFICATION  Patient Name: Jesus [name] Birthdate: 16-May-1951 Sex: male Admission Date (Current Location): 06/29/2015  [facility] and [place] Number:  [employer] and Address:  [facility] [address] [place]      Provider Number: [id number]  Attending Physician Name and Address:  Koleen Nimrod Acost*  Relative Name and Phone Number:       Current Level of Care: Hospital Recommended Level of Care: [place], Family Care Home Prior Approval Number:    Date Approved/Denied:   PASRR Number:    Discharge Plan: Other (Comment) (ALF)    Current Diagnoses: Patient Active Problem List   Diagnosis Date Noted  . 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 Bizarre behavior 07/01/2015  . Altered mental status   . AKI (acute kidney injury) ([place]) 06/30/2015  . Depression with anxiety 06/30/2015  . Acute encephalopathy 06/29/2015  . Iron deficiency anemia due to chronic blood loss 08/02/2014  . Metastatic urothelial carcinoma ([place]) 07/16/2014    Orientation RESPIRATION BLADDER Height & Weight    Self  Normal Incontinent 5\' 10"  (177.8 cm) 158 lbs.  BEHAVIORAL SYMPTOMS/MOOD NEUROLOGICAL BOWEL NUTRITION STATUS  Other (Comment) (see notes)  (possible seizures- new) Incontinent Diet (Regular)  AMBULATORY STATUS COMMUNICATION OF NEEDS Skin   Limited Assist Verbally Normal                       Personal Care Assistance Level of Assistance  Bathing, Feeding, Dressing Bathing Assistance: Limited assistance Feeding assistance: Limited assistance Dressing Assistance: Limited assistance     Functional Limitations Info  Sight, Hearing, Speech Sight Info: Impaired Hearing Info: Adequate Speech Info: Adequate    SPECIAL CARE FACTORS FREQUENCY                       Contractures      Additional Factors Info  Psychotropic Code Status Info: Full  code Allergies Info: No known allergies Psychotropic Info: Ativan         Current Medications (07/02/2015):  This is the current hospital active medication list Current Facility-Administered Medications  Medication Dose Route Frequency Provider Last Rate Last Dose  . 0.9 %  sodium chloride infusion   Intravenous Continuous Rexene Alberts, MD 75 mL/hr at 07/02/15 0850    . enoxaparin (LOVENOX) injection 40 mg  40 mg Subcutaneous Q24H Nimish C Gosrani, MD   40 mg at 07/01/15 1751  . LORazepam (ATIVAN) injection 0.5 mg  0.5 mg Intravenous PRN Rexene Alberts, MD      . ondansetron [facility]) tablet 4 mg  4 mg Oral Q6H PRN Nimish C Gosrani, MD       Or  . ondansetron (ZOFRAN) injection 4 mg  4 mg Intravenous Q6H PRN Nimish Luther Parody, MD         Discharge Medications: Please see discharge summary for a list of discharge medications.  Relevant Imaging Results:  Relevant Lab Results:   Additional Information Chemo at [place], [place], [place]

## 2015-07-02 NOTE — Clinical Social Work Note (Addendum)
Per Development worker, community, pt is over Medicaid limit. Pt's son, [name] notified and agreeable to $1300 a month private pay. [facility] assessed pt and can accept for this amount. [name] is on his way to hospital now and facility will provide transport. He requests to talk with pt about plans when he arrives.  Benay Pike, [place]

## 2015-07-02 NOTE — Clinical Social Work Placement (Signed)
   CLINICAL SOCIAL WORK PLACEMENT  NOTE  Date:  07/02/2015  Patient Details  Name: Jesus [name] MRN: [id number] Date of Birth: 1950/08/26  Clinical Social Work is seeking post-discharge placement for this patient at the [place] level of care (*CSW will initial, date and re-position this form in  chart as items are completed):  Yes   Patient/family provided with [place] Work Department's list of facilities offering this level of care within the geographic area requested by the patient (or if unable, by the patient's family).  Yes   Patient/family informed of their freedom to choose among providers that offer the needed level of care, that participate in Medicare, Medicaid or managed care program needed by the patient, have an available bed and are willing to accept the patient.  Yes   Patient/family informed of [facility]'s ownership interest in [facility] and [facility], as well as of the fact that they are under no obligation to receive care at these facilities.  PASRR submitted to EDS on 07/02/15     PASRR number received on       Existing PASRR number confirmed on       FL2 transmitted to all facilities in geographic area requested by pt/family on 07/02/15     FL2 transmitted to all facilities within larger geographic area on       Patient informed that his/her managed care company has contracts with or will negotiate with certain facilities, including the following:        Yes   Patient/family informed of bed offers received.  Patient chooses bed at Other - please specify in the comment section below:     Physician recommends and patient chooses bed at      Patient to be transferred to Other - please specify in the comment section below: on 07/02/15.  Patient to be transferred to facility by facility Lucianne Lei     Patient family notified on 07/02/15 of transfer.  Name of family member notified:  [name]- son     PHYSICIAN        Additional Comment:   [facility]. Pt will be private pay- no pasarr needed.  _______________________________________________ Salome Arnt, LCSW 07/02/2015, 1:20 PM [phone]

## 2015-07-02 NOTE — Clinical Social Work Note (Signed)
Pt's son has decided to take pt home with him to [facility] and will pursue placement there. [place] notified.  Benay Pike, [place]

## 2015-07-02 NOTE — Clinical Social Work Placement (Signed)
   CLINICAL SOCIAL WORK PLACEMENT  NOTE  Date:  07/02/2015  Patient Details  Name: Jesus [name] MRN: [id number] Date of Birth: 1950/09/04  Clinical Social Work is seeking post-discharge placement for this patient at the [place] level of care (*CSW will initial, date and re-position this form in  chart as items are completed):  Yes   Patient/family provided with [place] Work Department's list of facilities offering this level of care within the geographic area requested by the patient (or if unable, by the patient's family).  Yes   Patient/family informed of their freedom to choose among providers that offer the needed level of care, that participate in Medicare, Medicaid or managed care program needed by the patient, have an available bed and are willing to accept the patient.  Yes   Patient/family informed of [facility]'s ownership interest in [facility] and [facility], as well as of the fact that they are under no obligation to receive care at these facilities.  PASRR submitted to EDS on 07/02/15     PASRR number received on       Existing PASRR number confirmed on       FL2 transmitted to all facilities in geographic area requested by pt/family on 07/02/15     FL2 transmitted to all facilities within larger geographic area on       Patient informed that his/her managed care company has contracts with or will negotiate with certain facilities, including the following:            Patient/family informed of bed offers received.  Patient chooses bed at       Physician recommends and patient chooses bed at      Patient to be transferred to   on  .  Patient to be transferred to facility by       Patient family notified on   of transfer.  Name of family member notified:        PHYSICIAN       Additional Comment:    _______________________________________________ Salome Arnt, [place] 07/02/2015, 10:06  AM [phone]

## 2015-07-02 NOTE — Discharge Summary (Signed)
Physician Discharge Summary  [name] [id number] DOB: 01/15/1951 DOA: 06/29/2015  PCP: Jesus Neighbors, MD  Admit date: 06/29/2015 Discharge date: 07/02/2015  Time spent: 45 minutes  Recommendations for Outpatient Follow-up:  -Will be discharged to ALF today. -Advised to follow-up with primary care provider in 2 weeks.   Discharge Diagnoses:  Principal Problem:   Transient global amnesia Active Problems:   Metastatic urothelial carcinoma (HCC)   Acute encephalopathy   AKI (acute kidney injury) ([place])   Depression with anxiety   Bizarre behavior   Altered mental status   Discharge Condition: Stable and improved  Filed Weights   06/29/15 1700  Weight: 71.94 kg (158 lb 9.6 oz)    History of present illness:  As per Dr. Anastasio [name] on 1/8: Jesus [name] is a 65 y.o. male  This is a 65 year old man who has a history of bladder cancer and had recently been started on immunotherapy presents with acute altered mental status with what appears to be global amnesia. He can remember events of yesterday but this morning he was found approximately half a mile from his home carrying a basket of laundry in the middle of the road. He has no recollection of events. He was found by a firefighter and was brought to the emergency room. According to his sister, he was last seen his usual self a few days ago. The patient does remember correctly some of the events yesterday. There is no apparent limb weakness or problems with speech. There are no visual problems. He is now being admitted for further investigation.  Hospital Course:   Transient global amnesia -Seen by neurology. -Dementia workup so far negative, TSH and vitamin B-12 within normal limits. -EEG without evidence for seizure activity. -Evaluated by psychiatry and they believe that this episode was not psychiatric in origin. -Due to concerns with him living independently, arrangements have been made for ALF placement.  Acute on  chronic kidney disease stage III -It appears baseline creatinine is around 2, was 2.14 on admission and is down to 1.67 on discharge. -We'll need outpatient nephrology follow-up given his chronic kidney disease.  Procedures:  None   Consultations:  Neurology  Psychiatry  Discharge Instructions  Discharge Instructions    Increase activity slowly    Complete by:  As directed             Medication List    STOP taking these medications        HYDROcodone-acetaminophen 5-325 MG tablet  Commonly known as:  NORCO     LORazepam 0.5 MG tablet  Commonly known as:  ATIVAN     predniSONE 10 MG tablet  Commonly known as:  DELTASONE      TAKE these medications        ondansetron 8 MG tablet  Commonly known as:  ZOFRAN  Take by mouth every 12 (twelve) hours as needed for nausea or vomiting. Reported on 06/24/2015     prochlorperazine 10 MG tablet  Commonly known as:  COMPAZINE  Take 10 mg by mouth every 6 (six) hours as needed for nausea or vomiting. Reported on 06/24/2015       No Known Allergies     Follow-up Information    Follow up with Jesus Neighbors, MD. Schedule an appointment as soon as possible for a visit in 2 weeks.   Specialty:  Internal Medicine   Contact information:   [address] [place] [address] [phone]  The results of significant diagnostics from this hospitalization (including imaging, microbiology, ancillary and laboratory) are listed below for reference.    Significant Diagnostic Studies: Dg Chest 1 View  06/29/2015  CLINICAL DATA:  Altered mental status.  History of bladder cancer. EXAM: CHEST 1 VIEW COMPARISON:  None. FINDINGS: Study is hypoinspiratory with crowding of the perihilar bronchovascular markings. Pulmonary arteries somewhat prominent in appearance raising the possibility of a chronic underlying pulmonary artery hypertension. Lungs are otherwise clear. No evidence of pneumonia. No pleural effusion seen. No  pneumothorax. Heart size is normal. Overall cardiomediastinal silhouette is within normal limits in size and configuration. Right chest wall Port-A-Cath appears well positioned with tip projected over the lower SVC. No acute osseous abnormality seen. IMPRESSION: Lungs are clear and there is no evidence of acute cardiopulmonary abnormality. Questionable pulmonary artery hypertension. Hypoinspiratory changes as described above. Electronically Signed   By: Jesus [name] M.D.   On: 06/29/2015 13:10   Ct Head Wo Contrast  06/29/2015  CLINICAL DATA:  Altered mental status today. History of bladder carcinoma. EXAM: CT HEAD WITHOUT CONTRAST TECHNIQUE: Contiguous axial images were obtained from the base of the skull through the vertex without intravenous contrast. COMPARISON:  Brain MRI 08/12/2014. FINDINGS: The brain is atrophic. No evidence of acute abnormality including hemorrhage, infarct, mass lesion, mass effect, midline shift or abnormal extra-axial fluid collection is identified. There is no hydrocephalus or pneumocephalus. Imaged paranasal sinuses and mastoid air cells are clear. IMPRESSION: No acute abnormality. Cortical atrophy. Electronically Signed   By: Jesus [name] M.D.   On: 06/29/2015 13:20   Mr Jeri Cos [id number] Contrast  06/30/2015  CLINICAL DATA:  65 year old male with bladder cancer recently starting immunotherapy presenting with altered mental status/global amnesia. Subsequent encounter. EXAM: MRI HEAD WITHOUT AND WITH CONTRAST TECHNIQUE: Multiplanar, multiecho pulse sequences of the brain and surrounding structures were obtained without and with intravenous contrast. CONTRAST:  75mL MULTIHANCE GADOBENATE DIMEGLUMINE 529 MG/ML IV SOLN COMPARISON:  06/29/2015 CT.  08/12/2014 MR. FINDINGS: Exam is motion degraded. No acute infarct or intracranial hemorrhage. Moderate to marked global atrophy most notable cerebellum without hydrocephalus. Mild nonspecific periventricular white matter changes. No  intracranial mass or osseous destructive lesion to suggest presence of intracranial metastatic disease. Major intracranial vascular structures are patent. Cervical medullary junction, pituitary region, pineal region and orbital structures unremarkable. Mucosal thickening ethmoid sinus air cells. IMPRESSION: Exam is motion degraded. No acute infarct or intracranial hemorrhage. Moderate to marked global atrophy most notable cerebellum without hydrocephalus. Mild nonspecific periventricular white matter changes. No intracranial mass or osseous destructive lesion to suggest presence of intracranial metastatic disease. Electronically Signed   By: Genia Del M.D.   On: 06/30/2015 13:34   Nm Pet Image Restag (ps) Skull Base To Thigh  06/06/2015  CLINICAL DATA:  Subsequent treatment strategy for stage IV bladder carcinoma. Restaging examination. EXAM: NUCLEAR MEDICINE PET SKULL BASE TO THIGH TECHNIQUE: 8.9 mCi F-18 FDG was injected intravenously. Full-ring PET imaging was performed from the skull base to thigh after the radiotracer. CT data was obtained and used for attenuation correction and anatomic localization. FASTING BLOOD GLUCOSE:  Value: 125 mg/dl COMPARISON:  PET-CT 02/20/2015. FINDINGS: NECK No hypermetabolic lymph nodes in the neck. CHEST No hypermetabolic mediastinal or hilar nodes. No suspicious pulmonary nodules on the CT scan. Heart size is normal. No consolidative airspace disease. No pleural effusions. Right internal jugular single-lumen porta cath with tip terminating at the superior cavoatrial junction. ABDOMEN/PELVIS Compared a prior examinations there is new mild  left hydroureteronephrosis which extends all the way to the level of the left ureterovesicular junction. This is apparently related to obstruction from soft tissue at the level of the urinary bladder. Specifically, the urinary bladder wall is now markedly thickened and irregular, most evident along the left lateral surface, where the  largest mass-like area measures up to 5.8 x 3.5 cm (image 188 of series 4), with direct infiltration into the adjacent perivesical fat. This part of the urinary bladder wall is hypermetabolic (SUVmax = [id number]). There is extensive adjacent nodularity throughout the perivesicle space, presumably multiple metastatic lymph nodes, all of which are hypermetabolic. Extensive adenopathy throughout the left hemipelvis, with the largest lymph nodes measuring up to 12 mm in short axis in the left external iliac nodal chain (SUVmax = 11.9). Left internal iliac adenopathy also noted measuring up to 14 mm in short axis all (image 165 of series 4), which is also hypermetabolic (SUVmax = 123XX123). Numerous other enlarged and hypermetabolic retroperitoneal lymph nodes are noted, measuring up to 14 mm in short axis in the left para-aortic nodal station in the immediate infrahilar region (SUVmax = 16.3). No abnormal hypermetabolic activity within the liver, pancreas, adrenal glands, or spleen. Atherosclerosis throughout the abdominal and pelvic vasculature, without definite aneurysm. No significant volume of ascites. No pneumoperitoneum. SKELETON No focal hypermetabolic activity to suggest skeletal metastasis. IMPRESSION: 1. Today's study demonstrates definitive evidence of recurrence of disease, with a large left-sided bladder wall mass, infiltrated into the surrounding soft tissues, with extensive surrounding lymphadenopathy throughout the soft tissues of the pelvic floor, along the left pelvic sidewall and throughout the retroperitoneum on the left side extending to the level of the left renal hilum. At this time, this is associated with some mild left-sided hydroureteronephrosis from obstruction at the level of the left ureterovesicular junction. 2. No evidence of metastatic disease in the neck or thorax. 3. Additional incidental findings, as above. These results will be called to the ordering clinician or representative by the  Radiologist Assistant, and communication documented in the PACS or zVision Dashboard. Electronically Signed   By: Vinnie Langton M.D.   On: 06/06/2015 12:42    Microbiology: No results found for this or any previous visit (from the past 240 hour(s)).   Labs: Basic Metabolic Panel:  Recent Labs Lab 06/29/15 1055 06/29/15 1515 06/30/15 0606 07/01/15 0550 07/02/15 0642  NA 137 137 138 138 138  K 3.6 3.4* 3.4* 3.8 3.6  CL 99* 100* 102 101 104  CO2 25  --  26 27 27   GLUCOSE 222* 82 111* 116* 110*  BUN 28* 25* 23* 21* 16  CREATININE 2.14* 1.70* 1.89* 1.67* 1.67*  CALCIUM 9.0  --  8.3* 8.7* 8.3*   Liver Function Tests:  Recent Labs Lab 06/29/15 1055 06/30/15 0606  AST 29 27  ALT 16* 14*  ALKPHOS 56 60  BILITOT 1.0 0.9  PROT 6.7 6.1*  ALBUMIN 3.7 3.4*   No results for input(s): LIPASE, AMYLASE in the last 168 hours.  Recent Labs Lab 06/29/15 1300  AMMONIA 19   CBC:  Recent Labs Lab 06/29/15 1055 06/29/15 1515 06/30/15 0606 07/01/15 0550  WBC 7.3  --  8.0 8.5  HGB 13.5 14.3 13.6 14.7  HCT 39.9 42.0 41.1 43.9  MCV 86.4  --  86.7 86.8  PLT 179  --  174 181   Cardiac Enzymes: No results for input(s): CKTOTAL, CKMB, CKMBINDEX, TROPONINI in the last 168 hours. BNP: BNP (last 3 results) No results for  input(s): BNP in the last 8760 hours.  ProBNP (last 3 results) No results for input(s): PROBNP in the last 8760 hours.  CBG: No results for input(s): GLUCAP in the last 168 hours.     SignedLelon [name]  [facility] Pager: [phone] 07/02/2015, 12:57 PM

## 2015-07-02 NOTE — Clinical Social Work Note (Signed)
Clinical Social Work Assessment  Patient Details  Name: Jesus [name] MRN: [id number] Date of Birth: [birth year]  Date of referral:  07/02/15               Reason for consult:  Discharge Planning                Permission sought to share information with:    Permission granted to share information::     Name::        Agency::     Relationship::     Contact Information:     Housing/Transportation Living arrangements for the past 2 months:  Single Family Home Source of Information:  Adult Children Patient Interpreter Needed:  None Criminal Activity/Legal Involvement Pertinent to Current Situation/Hospitalization:  No - Comment as needed Significant Relationships:  Adult Children, Siblings Lives with:  Self Do you feel safe going back to the place where you live?  No Need for family participation in patient care:  Yes (Comment)  Care giving concerns:  Pt lives alone.    Social Worker assessment / plan:  CSW spoke with pt's son, [name] who reports he is HCPOA. Pt oriented to self only. [name] indicates that pt lives alone and was functioning okay at home. He feels pt has had a cognitive decline for the past several years, but events prior to admission were a drastic change for him. Pt assessed by psychiatry yesterday evening and not felt to be psych related. Mark lives in [place] and pt's best local support is his sister, [name]. Pt has been receiving chemo at [facility] for stage IV urothelial cancer. [name] is aware pt is approaching d/c and is concerned about pt's ability to continue to live alone. No skilled need noted at this time. Discussed ALF as pt is relatively independent. Pt's son agrees to begin bed search in [place]. He has contacted [place], but unable to start application over phone. CSW notified financial counselor who is agreeable to assist. Son is trying to get to [place] today to handle business. He is aware if unable to find placement today and  pt is medically stable, pt will need to return home with family supervision.   Employment status:  Disabled (Comment on whether or not currently receiving Disability) Insurance information:  Managed Medicare PT Recommendations:  Not assessed at this time Information / Referral to community resources:  Other (Comment Required) (ALF list)  Patient/Family's Response to care:  Pt's son agrees pt will need placement at d/c due to mental status change.   Patient/Family's Understanding of and Emotional Response to Diagnosis, Current Treatment, and Prognosis:  Pt's son appears to be knowledgeable of pt's medical history and is requesting to speak to MD regarding findings of tests.   Emotional Assessment Appearance:  Appears stated age Attitude/Demeanor/Rapport:  Unable to Assess Affect (typically observed):  Unable to Assess Orientation:  Oriented to Self Alcohol / Substance use:  Not Applicable Psych involvement (Current and /or in the community):  Yes (Comment) (cleared by psych inpatient)  Discharge Needs  Concerns to be addressed:  Discharge Planning Concerns, Home Safety Concerns Readmission within the last 30 days:  No Current discharge risk:  Cognitively Impaired Barriers to Discharge:  Continued Medical Work up   [employer], [employer], [place] 07/02/2015, 10:07 AM [phone]

## 2015-07-02 NOTE — Progress Notes (Signed)
Patient with discharge orders. Discharge instruction reviewed with patient's son, Tanna Furry, verbalized understanding. Prescriptions given. Patient stable. Patient left with son in private vehicle.

## 2015-07-03 ENCOUNTER — Ambulatory Visit (HOSPITAL_COMMUNITY): Payer: Commercial Managed Care - HMO | Admitting: Oncology

## 2015-07-03 ENCOUNTER — Telehealth (HOSPITAL_COMMUNITY): Payer: Self-pay | Admitting: Oncology

## 2015-07-03 NOTE — Telephone Encounter (Signed)
No answer.  VM left requesting a call back at [facility] regarding the patient's condition update.  Doy Mince 07/03/2015 5:34 PM

## 2015-07-04 ENCOUNTER — Telehealth: Payer: Self-pay | Admitting: Family Medicine

## 2015-07-04 ENCOUNTER — Other Ambulatory Visit (HOSPITAL_COMMUNITY): Payer: Self-pay | Admitting: Oncology

## 2015-07-04 ENCOUNTER — Encounter (HOSPITAL_COMMUNITY): Payer: Self-pay | Admitting: *Deleted

## 2015-07-04 ENCOUNTER — Inpatient Hospital Stay (HOSPITAL_COMMUNITY)
Admission: EM | Admit: 2015-07-04 | Discharge: 2015-07-10 | DRG: 092 | Disposition: A | Discharge status: Skilled Nursing Facility | Payer: Commercial Managed Care - HMO | Attending: Family Medicine | Admitting: Family Medicine

## 2015-07-04 DIAGNOSIS — Z452 Encounter for adjustment and management of vascular access device: Secondary | ICD-10-CM

## 2015-07-04 DIAGNOSIS — Z66 Do not resuscitate: Secondary | ICD-10-CM | POA: Diagnosis present

## 2015-07-04 DIAGNOSIS — R030 Elevated blood-pressure reading, without diagnosis of hypertension: Secondary | ICD-10-CM | POA: Diagnosis present

## 2015-07-04 DIAGNOSIS — N179 Acute kidney failure, unspecified: Secondary | ICD-10-CM | POA: Diagnosis present

## 2015-07-04 DIAGNOSIS — Z809 Family history of malignant neoplasm, unspecified: Secondary | ICD-10-CM

## 2015-07-04 DIAGNOSIS — G9340 Encephalopathy, unspecified: Secondary | ICD-10-CM | POA: Diagnosis present

## 2015-07-04 DIAGNOSIS — R4182 Altered mental status, unspecified: Secondary | ICD-10-CM

## 2015-07-04 DIAGNOSIS — Z515 Encounter for palliative care: Secondary | ICD-10-CM | POA: Insufficient documentation

## 2015-07-04 DIAGNOSIS — C7910 Secondary malignant neoplasm of unspecified urinary organs: Secondary | ICD-10-CM | POA: Diagnosis present

## 2015-07-04 DIAGNOSIS — N183 Chronic kidney disease, stage 3 unspecified: Secondary | ICD-10-CM | POA: Diagnosis present

## 2015-07-04 DIAGNOSIS — E785 Hyperlipidemia, unspecified: Secondary | ICD-10-CM | POA: Diagnosis present

## 2015-07-04 DIAGNOSIS — Z7189 Other specified counseling: Secondary | ICD-10-CM | POA: Insufficient documentation

## 2015-07-04 DIAGNOSIS — G92 Toxic encephalopathy: Secondary | ICD-10-CM | POA: Diagnosis not present

## 2015-07-04 DIAGNOSIS — C679 Malignant neoplasm of bladder, unspecified: Secondary | ICD-10-CM | POA: Diagnosis present

## 2015-07-04 DIAGNOSIS — T50995A Adverse effect of other drugs, medicaments and biological substances, initial encounter: Secondary | ICD-10-CM | POA: Diagnosis present

## 2015-07-04 DIAGNOSIS — C772 Secondary and unspecified malignant neoplasm of intra-abdominal lymph nodes: Secondary | ICD-10-CM | POA: Diagnosis present

## 2015-07-04 DIAGNOSIS — C7919 Secondary malignant neoplasm of other urinary organs: Secondary | ICD-10-CM | POA: Diagnosis present

## 2015-07-04 DIAGNOSIS — E7800 Pure hypercholesterolemia, unspecified: Secondary | ICD-10-CM | POA: Diagnosis present

## 2015-07-04 DIAGNOSIS — M1990 Unspecified osteoarthritis, unspecified site: Secondary | ICD-10-CM | POA: Diagnosis present

## 2015-07-04 DIAGNOSIS — Z823 Family history of stroke: Secondary | ICD-10-CM

## 2015-07-04 DIAGNOSIS — Z9221 Personal history of antineoplastic chemotherapy: Secondary | ICD-10-CM

## 2015-07-04 DIAGNOSIS — F418 Other specified anxiety disorders: Secondary | ICD-10-CM | POA: Diagnosis present

## 2015-07-04 DIAGNOSIS — G454 Transient global amnesia: Secondary | ICD-10-CM | POA: Diagnosis present

## 2015-07-04 DIAGNOSIS — E876 Hypokalemia: Secondary | ICD-10-CM | POA: Diagnosis present

## 2015-07-04 LAB — CBC WITH DIFFERENTIAL/PLATELET
Basophils Absolute: 0 10*3/uL (ref 0.0–0.1)
Basophils Relative: 0 %
EOS ABS: 0.1 10*3/uL (ref 0.0–0.7)
EOS PCT: 2 %
HCT: 37.3 % — ABNORMAL LOW (ref 39.0–52.0)
Hemoglobin: 12.3 g/dL — ABNORMAL LOW (ref 13.0–17.0)
LYMPHS ABS: 1.2 10*3/uL (ref 0.7–4.0)
Lymphocytes Relative: 21 %
MCH: 28.5 pg (ref 26.0–34.0)
MCHC: 33 g/dL (ref 30.0–36.0)
MCV: 86.5 fL (ref 78.0–100.0)
MONOS PCT: 6 %
Monocytes Absolute: 0.4 10*3/uL (ref 0.1–1.0)
Neutro Abs: 4.1 10*3/uL (ref 1.7–7.7)
Neutrophils Relative %: 71 %
PLATELETS: 164 10*3/uL (ref 150–400)
RBC: 4.31 MIL/uL (ref 4.22–5.81)
RDW: 13.2 % (ref 11.5–15.5)
WBC: 5.8 10*3/uL (ref 4.0–10.5)

## 2015-07-04 MED ORDER — METHYLPREDNISOLONE SODIUM SUCC 125 MG IJ SOLR
INTRAMUSCULAR | Status: AC
  Filled 2015-07-04: qty 4

## 2015-07-04 MED ORDER — SODIUM CHLORIDE 0.9 % IV SOLN
140.0000 mg | Freq: Once | INTRAVENOUS | Status: AC
  Administered 2015-07-05: 140 mg via INTRAVENOUS
  Filled 2015-07-04: qty 1.12

## 2015-07-04 MED ORDER — PREDNISONE 20 MG PO TABS: ORAL_TABLET | ORAL | Status: DC

## 2015-07-04 NOTE — ED Notes (Signed)
Pt presents to er with son for admission to hospital for placement in hospice, son reports that pt was recently admitted [facility] for [facility] status and since being discharged home pt's condition has declined, son spoke with oncology who also spoke with Dr Myna Hidalgo who advised for pt to return to er for admission,

## 2015-07-04 NOTE — ED Provider Notes (Signed)
CSN: [id number]     Arrival date & time 07/04/15  2101 History   First MD Initiated Contact with Patient 07/04/15 2225     Chief Complaint  Patient presents with  . Follow-up      HPI  Patient presents for evaluation of confusion and nausea.  She has a history of metastatic urothelial cancer. Recently admitted for an episode of altered mental status. He lives in [place]. His son lives in [place]. He was discharged 2 days ago to his son's care. He has been staying with his son in [place]. Son states that he continues to have episodes of lucidity alternating with confusion. His admits this is slightly better today but does persist. He had discussed the case with Dr. Whitney Muse the patient's oncologist.  She feels this is likely an adverse response to his immunotherapy. He was started with his first infusion 9 days ago.  Procedure call from [place], the PA at the cancer center, as well as Dr. Whitney Muse the patient's oncologist prior to the patient's arrival. They felt the best course of action would be to admit him to the hospital. And to treat him with high-dose IV steroids which would be appropriate therapy for an adverse response to his immunotherapy. If he did not improve. They felt that her consultation with case manager and social work and discharge planning to probable SNIF unit would be in order.  Patient had some mild nausea last night that improved with ondansetron. He otherwise feels well today. Son states that that today does seem better than yesterday but again the symptoms persist  Past Medical History  Diagnosis Date  . High cholesterol   . Depression     hx of   . Arthritis   . Bladder cancer ([place])   . Metastatic urothelial carcinoma ([place]) 07/16/2014   Past Surgical History  Procedure Laterality Date  . Knee surgery    . Tonsillectomy    . Transurethral resection of bladder tumor with gyrus (turbt-gyrus) N/A 07/16/2014    Procedure: TRANSURETHRAL RESECTION OF BLADDER TUMOR WITH  GYRUS (TURBT-GYRUS);  Surgeon: Malka So, MD;  Location: [facility];  Service: Urology;  Laterality: N/A;  . Cystoscopy with biopsy N/A 07/16/2014    Procedure: CYSTOSCOPY WITH PROSTATE ULTRASOUND AND BIOPSY;  Surgeon: Malka So, MD;  Location: [facility];  Service: Urology;  Laterality: N/A;  . Transurethral resection of bladder tumor N/A 06/19/2015    Procedure: TRANSURETHRAL RESECTION OF BLADDER TUMOR (TURBT);  Surgeon: Irine Seal, MD;  Location: [facility];  Service: Urology;  Laterality: N/A;   Family History  Problem Relation Age of Onset  . Cancer Mother   . Stroke Father   . Cancer Sister    Social History  Substance Use Topics  . Smoking status: Never Smoker   . Smokeless tobacco: Never Used  . Alcohol Use: No    Review of Systems  Constitutional: Negative for fever, chills, diaphoresis, appetite change and fatigue.  HENT: Negative for mouth sores, sore throat and trouble swallowing.   Eyes: Negative for visual disturbance.  Respiratory: Negative for cough, chest tightness, shortness of breath and wheezing.   Cardiovascular: Negative for chest pain.  Gastrointestinal: Positive for nausea. Negative for vomiting, abdominal pain, diarrhea and abdominal distention.  Endocrine: Negative for polydipsia, polyphagia and polyuria.  Genitourinary: Negative for dysuria, frequency and hematuria.  Musculoskeletal: Negative for gait problem.  Skin: Negative for color change, pallor and rash.  Neurological: Negative for dizziness, syncope, light-headedness and headaches.  Hematological:  Does not bruise/bleed easily.  Psychiatric/Behavioral: Positive for confusion. Negative for behavioral problems.      Allergies  Review of patient's allergies indicates no known allergies.  Home Medications   Prior to Admission medications   Medication Sig Start Date End Date Taking? Authorizing Provider  FOLIC ACID PO Take 1 tablet by mouth daily.   Yes Historical Provider, MD  lidocaine-prilocaine  (EMLA) cream Apply 1 application topically as needed (for port access).   Yes Historical Provider, MD  ondansetron (ZOFRAN) 8 MG tablet Take 1 tablet (8 mg total) by mouth every 8 (eight) hours as needed for nausea or vomiting. Reported on 06/24/2015 07/02/15  Yes Estela Leonie Green, MD  prochlorperazine (COMPAZINE) 10 MG tablet Take 1 tablet (10 mg total) by mouth every 6 (six) hours as needed for refractory nausea / vomiting. Reported on 06/24/2015 07/02/15  Yes Estela Leonie Green, MD  predniSONE (DELTASONE) 20 MG tablet Take 60 mg daily PO.  If no improvement in 7 days, please taper according to instructions. Patient not taking: Reported on 07/04/2015 07/04/15   Manon Hilding Kefalas, PA-C   BP 134/90 mmHg  Pulse 67  Temp(Src) 98.6 F (37 C) (Oral)  Resp 18  Ht 5\' 8"  (1.727 m)  Wt 158 lb (71.668 kg)  BMI 24.03 kg/m2  SpO2 97% Physical Exam  Constitutional: He is oriented to person, place, and time. He appears well-developed and well-nourished. No distress.  HENT:  Head: Normocephalic.  Eyes: Conjunctivae are normal. Pupils are equal, round, and reactive to light. No scleral icterus.  Neck: Normal range of motion. Neck supple. No thyromegaly present.  Cardiovascular: Normal rate and regular rhythm.  Exam reveals no gallop and no friction rub.   No murmur heard. Pulmonary/Chest: Effort normal and breath sounds normal. No respiratory distress. He has no wheezes. He has no rales.  Abdominal: Soft. Bowel sounds are normal. He exhibits no distension. There is no tenderness. There is no rebound.  Musculoskeletal: Normal range of motion.  Neurological: He is alert and oriented to person, place, and time.  Skin: Skin is warm and dry. No rash noted.  Psychiatric: He has a normal mood and affect. His behavior is normal.    ED Course  Procedures (including critical care time) Labs Review Labs Reviewed  CBC WITH DIFFERENTIAL/PLATELET - Abnormal; Notable for the following:    Hemoglobin  12.3 (*)    HCT 37.3 (*)    All other components within normal limits  COMPREHENSIVE METABOLIC PANEL - Abnormal; Notable for the following:    Sodium 134 (*)    Chloride 98 (*)    Glucose, Bld 110 (*)    BUN 24 (*)    Creatinine, Ser 1.87 (*)    Calcium 8.7 (*)    Albumin 3.4 (*)    AST 43 (*)    GFR calc non Af Amer 37 (*)    GFR calc Af Amer 43 (*)    All other components within normal limits    Imaging Review No results found. I have personally reviewed and evaluated these images and lab results as part of my medical decision-making.   EKG Interpretation None      MDM   Final diagnoses:  Encephalopathy    His case with Dr. Darrick Meigs. Patient given first dose IV steroids here. Patient will be admitted    Tanna Furry, MD 07/05/15 [phone]

## 2015-07-04 NOTE — Telephone Encounter (Signed)
Jesus [name] has metastatic urothelial cancer and was recently discharged after admission for AMS.  He returned to his son's house and has since suffered worsening of AMS.  He is reportedly alert and lucid for ~5 minutes at a time before falling asleep and becoming difficult to arouse. His oncology team is concerned that this represents advancement of his cancer.  Pt's family would like to pursue hospice, but the oncology team has been unable to secure a local hospice bed for him.  They would like him admitted to [facility], treated with high-dose steroids for suspected brain mets, and discharged to local SNF where search for local hospice bed can continue.

## 2015-07-04 NOTE — ED Provider Notes (Addendum)
I received a call from [employer] at the cancer center prior to the patient's arrival. Arrangements had been made for the patient to be admitted through the emergency room for possibility of hospice care.  I also received a call from Dr. Ancil Linsey, oncologist.  She felt that the patient's encephalopathy may be secondary to his new chemotherapeutic/Immunotherapy agent. She described to me that the appropriate treatment for such would be high-dose steroids at 140 mg IV per day and requested that this be started in the emergency room.  Tanna Furry, MD 07/04/15 2155  Tanna Furry, MD 07/04/15 [phone]

## 2015-07-04 NOTE — Progress Notes (Signed)
I was able to touch base today with Jesus [name], the patient's son.  We spoke early this afternoon.  He provided me an update regarding his father.  He reports that when the patient was discharged from [facility], he was not at his baseline.  He was planned to be discharged to assisted living facility, however, the patient's son decided to take him home with him in [place], [place].  He had a follow-up appointment with me on 07/03/2015 but the patient was a no-show.  The patient's son, Jesus [name], was not aware of the appointment.  Anyway, Jesus [name] reports that he does not think the patient's current state has anything to do with his therapy.  Nursing staff reports that the day he was treated, Jesus [name] was not at baseline and was a little different; but not so much so that would prevent treatment.  His labs met treatment parameters and therefore, his treatment was given as planned.    He was subsequently found walking in the street, with laundry in hand.  He was found by a firefighter and brought to the hospital for admission.  Oncology was consulted.  Our note is noted.  Neurology saw the patient as well.  He was discharged and we were not able to get back to the patient prior to discharge.  We discussed the patient's case with Dr. Zola Button and he confirmed our thoughts that the patient's situation is not likely from [facility] treatment.  This medication has an extremely rare risk of encephalitis, but it is only noted in case reports.  EEG report noted.  We will treat once admitted with high dose steroids in the event that his mental status is secondary to [employer].  (If he does respond to this intervention, the PI reports permanent discontinuation of Atezolizumab in this situation.)  As mentioned above, he did not show-up for his follow-up appointment on 07/03/2015.  Jesus [name] reports that given his father's decline, he is interested in pursuing Hospice intervention.  He reports that over the past 24  hours, he notes his father has 5 minutes of clear mindedness (but even this is below his baseline he reports) and then sleeps for hours.  He notes that he slumps over at the dinner table.  Jesus [name] reports that most of family lives in [place] and he would rather be the only one driving to [place] instead of all of the family driving to [place] to be with Jesus [name].  Unfortunately, the patient does not have family locally that can be with him 24/7 with Hospice at home.  Therefore, our options are limited.  I made some phone calls.  [facility] does not have any beds available at this time and there are 1-2 patients waiting for a bed at this time.  [facility] does not have any beds available and there are a number of patient waiting for a bed as well.  Our only option to get him back home locally, closer to family, with Hospice is to get him placed into a nursing home (SNF).  The quickest way to to that is to get him admitted to the hospital for SNF placement.    I spoke with Dr. Myna Hidalgo, admitting hospitalist, who is agreeable to directly admit the patient to [facility].  I also discussed the patient's situation with Dr. Tanna Furry, ED physician.  Jesus [name] reports that he will bring the patient his father to the [facility] ED tonight.  Everyone is on board with  plan moving forward:  1. Admit patient to the hospital 2. Consult case management for:  A. SNF placement  B. Hospice consult 3. ONCOLOGY will write for high dose steroids IV once admitted (in the event that the patient's decline is related to Atezolizumab toxicity).  4. Consult oncology for inpatient follow-up on Monday if patient still admitted.  Please call [facility] with any questions or concerns.  Please call Dr. Whitney Muse (Med Onc with any questions, concerns, or issues): [phone].  KEFALAS,THOMAS, PA-C 07/04/2015 5:00 PM

## 2015-07-05 DIAGNOSIS — C689 Malignant neoplasm of urinary organ, unspecified: Secondary | ICD-10-CM | POA: Diagnosis not present

## 2015-07-05 DIAGNOSIS — N183 Chronic kidney disease, stage 3 (moderate): Secondary | ICD-10-CM | POA: Diagnosis not present

## 2015-07-05 DIAGNOSIS — C7910 Secondary malignant neoplasm of unspecified urinary organs: Secondary | ICD-10-CM | POA: Diagnosis not present

## 2015-07-05 DIAGNOSIS — T50995A Adverse effect of other drugs, medicaments and biological substances, initial encounter: Secondary | ICD-10-CM | POA: Diagnosis present

## 2015-07-05 DIAGNOSIS — E785 Hyperlipidemia, unspecified: Secondary | ICD-10-CM | POA: Diagnosis present

## 2015-07-05 DIAGNOSIS — G9340 Encephalopathy, unspecified: Secondary | ICD-10-CM | POA: Diagnosis present

## 2015-07-05 DIAGNOSIS — N179 Acute kidney failure, unspecified: Secondary | ICD-10-CM | POA: Diagnosis not present

## 2015-07-05 DIAGNOSIS — E876 Hypokalemia: Secondary | ICD-10-CM | POA: Diagnosis present

## 2015-07-05 DIAGNOSIS — R4182 Altered mental status, unspecified: Secondary | ICD-10-CM | POA: Diagnosis present

## 2015-07-05 DIAGNOSIS — R413 Other amnesia: Secondary | ICD-10-CM | POA: Diagnosis not present

## 2015-07-05 DIAGNOSIS — Z809 Family history of malignant neoplasm, unspecified: Secondary | ICD-10-CM | POA: Diagnosis not present

## 2015-07-05 DIAGNOSIS — F418 Other specified anxiety disorders: Secondary | ICD-10-CM | POA: Diagnosis present

## 2015-07-05 DIAGNOSIS — Z452 Encounter for adjustment and management of vascular access device: Secondary | ICD-10-CM | POA: Diagnosis not present

## 2015-07-05 DIAGNOSIS — C7919 Secondary malignant neoplasm of other urinary organs: Secondary | ICD-10-CM | POA: Diagnosis present

## 2015-07-05 DIAGNOSIS — C679 Malignant neoplasm of bladder, unspecified: Secondary | ICD-10-CM | POA: Diagnosis present

## 2015-07-05 DIAGNOSIS — M1990 Unspecified osteoarthritis, unspecified site: Secondary | ICD-10-CM | POA: Diagnosis present

## 2015-07-05 DIAGNOSIS — G454 Transient global amnesia: Secondary | ICD-10-CM | POA: Diagnosis present

## 2015-07-05 DIAGNOSIS — Z9221 Personal history of antineoplastic chemotherapy: Secondary | ICD-10-CM | POA: Diagnosis not present

## 2015-07-05 DIAGNOSIS — E7800 Pure hypercholesterolemia, unspecified: Secondary | ICD-10-CM | POA: Diagnosis present

## 2015-07-05 DIAGNOSIS — R030 Elevated blood-pressure reading, without diagnosis of hypertension: Secondary | ICD-10-CM | POA: Diagnosis present

## 2015-07-05 DIAGNOSIS — Z66 Do not resuscitate: Secondary | ICD-10-CM | POA: Diagnosis present

## 2015-07-05 DIAGNOSIS — Z515 Encounter for palliative care: Secondary | ICD-10-CM | POA: Diagnosis not present

## 2015-07-05 DIAGNOSIS — C772 Secondary and unspecified malignant neoplasm of intra-abdominal lymph nodes: Secondary | ICD-10-CM | POA: Diagnosis present

## 2015-07-05 DIAGNOSIS — G92 Toxic encephalopathy: Secondary | ICD-10-CM | POA: Diagnosis present

## 2015-07-05 DIAGNOSIS — Z823 Family history of stroke: Secondary | ICD-10-CM | POA: Diagnosis not present

## 2015-07-05 LAB — COMPREHENSIVE METABOLIC PANEL
ALBUMIN: 3.4 g/dL — AB (ref 3.5–5.0)
ALT: 35 U/L (ref 17–63)
ANION GAP: 10 (ref 5–15)
AST: 43 U/L — ABNORMAL HIGH (ref 15–41)
Alkaline Phosphatase: 82 U/L (ref 38–126)
BILIRUBIN TOTAL: 1 mg/dL (ref 0.3–1.2)
BUN: 24 mg/dL — ABNORMAL HIGH (ref 6–20)
CO2: 26 mmol/L (ref 22–32)
Calcium: 8.7 mg/dL — ABNORMAL LOW (ref 8.9–10.3)
Chloride: 98 mmol/L — ABNORMAL LOW (ref 101–111)
Creatinine, Ser: 1.87 mg/dL — ABNORMAL HIGH (ref 0.61–1.24)
GFR calc Af Amer: 43 mL/min — ABNORMAL LOW (ref >60)
GFR calc non Af Amer: 37 mL/min — ABNORMAL LOW (ref >60)
GLUCOSE: 110 mg/dL — AB (ref 65–99)
POTASSIUM: 3.8 mmol/L (ref 3.5–5.1)
SODIUM: 134 mmol/L — AB (ref 135–145)
TOTAL PROTEIN: 6.6 g/dL (ref 6.5–8.1)

## 2015-07-05 MED ORDER — ONDANSETRON HCL 4 MG/2ML IJ SOLN: 4.0000 mg | Freq: Four times a day (QID) | INTRAMUSCULAR | Status: DC | PRN

## 2015-07-05 MED ORDER — ACETAMINOPHEN 650 MG RE SUPP: 650.0000 mg | Freq: Four times a day (QID) | RECTAL | Status: DC | PRN

## 2015-07-05 MED ORDER — SODIUM CHLORIDE 0.9 % IV SOLN
INTRAVENOUS | Status: DC
  Administered 2015-07-05 – 2015-07-06 (×3): via INTRAVENOUS
  Administered 2015-07-07: 1000 mL via INTRAVENOUS
  Administered 2015-07-08: 09:00:00 via INTRAVENOUS

## 2015-07-05 MED ORDER — ONDANSETRON HCL 4 MG PO TABS: 4.0000 mg | ORAL_TABLET | Freq: Four times a day (QID) | ORAL | Status: DC | PRN

## 2015-07-05 MED ORDER — ACETAMINOPHEN 325 MG PO TABS: 650.0000 mg | ORAL_TABLET | Freq: Four times a day (QID) | ORAL | Status: DC | PRN

## 2015-07-05 MED ORDER — SODIUM CHLORIDE 0.9 % IV SOLN
140.0000 mg | INTRAVENOUS | Status: DC
  Administered 2015-07-05 – 2015-07-09 (×5): 140 mg via INTRAVENOUS
  Filled 2015-07-05 (×6): qty 1.12

## 2015-07-05 MED ORDER — ENOXAPARIN SODIUM 40 MG/0.4ML ~~LOC~~ SOLN
40.0000 mg | SUBCUTANEOUS | Status: DC
  Administered 2015-07-05 – 2015-07-10 (×6): 40 mg via SUBCUTANEOUS
  Filled 2015-07-05 (×6): qty 0.4

## 2015-07-05 NOTE — ED Notes (Signed)
Solumedrol completed

## 2015-07-05 NOTE — H&P (Signed)
PCP:   Wende Neighbors, MD   Chief Complaint:  Altered mental status  HPI: 65 year old male who   has a past medical history of High cholesterol; Depression; Arthritis; Bladder cancer ([place]); and Metastatic urothelial carcinoma ([place]) (07/16/2014). Patient has history of bladder cancer and was started on immunotherapy, was recently admitted with transient global amnesia at that time he was seen by neurology and dementia workup was negative, EEG showed no evidence of seizure activity, psych evaluation showed that patient did not have any psychiatric problem causing this altered mental status. Patient was discharged home. As per son patient was not at baseline, continued to have episodes of confusion. Today oncology office was contacted by patient's son and he thought that patient's situation is likely from immunotherapy Atezolizumab. Dr. Ancil Linsey, oncologist, called and discussed with ED physician, Truitt Merle and recommended to treat with high-dose steroids at 140 mg IV per day. Patient was given Solu-Medrol 140 mg IV 1 in the ED And now being admitted for daily IV Solu-Medrol.  Patient is somnolent but arousable. He is unable to provide any significant history. As per patient's son there is no nausea vomiting or diarrhea. He did not complete of chest pain. Does complain of generalized weakness from cancer.  Allergies:  No Known Allergies    Past Medical History  Diagnosis Date  . High cholesterol   . Depression     hx of   . Arthritis   . Bladder cancer ([place])   . Metastatic urothelial carcinoma ([place]) 07/16/2014    Past Surgical History  Procedure Laterality Date  . Knee surgery    . Tonsillectomy    . Transurethral resection of bladder tumor with gyrus (turbt-gyrus) N/A 07/16/2014    Procedure: TRANSURETHRAL RESECTION OF BLADDER TUMOR WITH GYRUS (TURBT-GYRUS);  Surgeon: Malka So, MD;  Location: [facility];  Service: Urology;  Laterality: N/A;  . Cystoscopy with biopsy N/A  07/16/2014    Procedure: CYSTOSCOPY WITH PROSTATE ULTRASOUND AND BIOPSY;  Surgeon: Malka So, MD;  Location: [facility];  Service: Urology;  Laterality: N/A;  . Transurethral resection of bladder tumor N/A 06/19/2015    Procedure: TRANSURETHRAL RESECTION OF BLADDER TUMOR (TURBT);  Surgeon: Irine Seal, MD;  Location: [facility];  Service: Urology;  Laterality: N/A;    Prior to Admission medications   Medication Sig Start Date End Date Taking? Authorizing Provider  FOLIC ACID PO Take 1 tablet by mouth daily.   Yes Historical Provider, MD  lidocaine-prilocaine (EMLA) cream Apply 1 application topically as needed (for port access).   Yes Historical Provider, MD  ondansetron (ZOFRAN) 8 MG tablet Take 1 tablet (8 mg total) by mouth every 8 (eight) hours as needed for nausea or vomiting. Reported on 06/24/2015 07/02/15  Yes Estela Leonie Green, MD  prochlorperazine (COMPAZINE) 10 MG tablet Take 1 tablet (10 mg total) by mouth every 6 (six) hours as needed for refractory nausea / vomiting. Reported on 06/24/2015 07/02/15  Yes Estela Leonie Green, MD  predniSONE (DELTASONE) 20 MG tablet Take 60 mg daily PO.  If no improvement in 7 days, please taper according to instructions. Patient not taking: Reported on 07/04/2015 07/04/15   Baird Cancer, PA-C    Social History:  reports that he has never smoked. He has never used smokeless tobacco. He reports that he does not drink alcohol or use illicit drugs.  Family History  Problem Relation Age of Onset  . Cancer Mother   . Stroke Father   .  Cancer Sister     Danley Danker Weights   07/04/15 2111  Weight: 71.668 kg (158 lb)      Review of Systems:  Unable to obtain due to patient's altered mental status   Physical Exam: Blood pressure 134/90, pulse 67, temperature 98.6 F (37 C), temperature source Oral, resp. rate 18, height 5\' 8"  (1.727 m), weight 71.668 kg (158 lb), SpO2 97 %. Constitutional:   Patient is a well-developed and well-nourished male*  in no acute distress and cooperative with exam. Head: Normocephalic and atraumatic Mouth: Mucus membranes moist Eyes: PERRL, EOMI, conjunctivae normal Neck: Supple, No Thyromegaly Cardiovascular: RRR, S1 normal, S2 normal Pulmonary/Chest: CTAB, no wheezes, rales, or rhonchi Abdominal: Soft. Non-tender, non-distended, bowel sounds are normal, no masses, organomegaly, or guarding present.  Neurological: Alert, not oriented 3 Strength is normal and symmetric bilaterally, cranial nerve II-XII are grossly intact, no focal motor deficit, sensory intact to light touch bilaterally.  Extremities : No Cyanosis, Clubbing or Edema  Labs on Admission:  Basic Metabolic Panel:  Recent Labs Lab 06/29/15 1055 06/29/15 1515 06/30/15 0606 07/01/15 0550 07/02/15 0642 07/04/15 2340  NA 137 137 138 138 138 134*  K 3.6 3.4* 3.4* 3.8 3.6 3.8  CL 99* 100* 102 101 104 98*  CO2 25  --  26 27 27 26   GLUCOSE 222* 82 111* 116* 110* 110*  BUN 28* 25* 23* 21* 16 24*  CREATININE 2.14* 1.70* 1.89* 1.67* 1.67* 1.87*  CALCIUM 9.0  --  8.3* 8.7* 8.3* 8.7*   Liver Function Tests:  Recent Labs Lab 06/29/15 1055 06/30/15 0606 07/04/15 2340  AST 29 27 43*  ALT 16* 14* 35  ALKPHOS 56 60 82  BILITOT 1.0 0.9 1.0  PROT 6.7 6.1* 6.6  ALBUMIN 3.7 3.4* 3.4*   No results for input(s): LIPASE, AMYLASE in the last 168 hours.  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  Acute on chronic kidney disease stage III Patient's baseline creatinine was 1.67 on 07/02/2015 Today creatinine is 1.87 with BUN 24 Will start gentle IV hydration with normal saline at 75 mL per hour  DVT prophylaxis Lovenox  Code status: full code  Family discussion: Admission, patients condition and plan of care including tests being ordered have been discussed with the patient and his son at bedside* who indicate understanding and agree with the plan and Code Status.   Time Spent on Admission: 60 min  [facility] Pager: [phone] 07/05/2015, 1:11 AM  If 7PM-7AM, please contact night-coverage  [email or web address]  Password [email or web address]

## 2015-07-05 NOTE — Progress Notes (Signed)
PROGRESS NOTE  Jesus [name] [id number] DOB: [birth year] DOA: 07/04/2015 PCP: Wende Neighbors, MD  HPI/Recap of past 87 hours: 65 year old male with past oral history of depression, hyperlipidemia and  metastatic urothelial carcinoma who had been started on immunotherapy have been suffering in the last few weeks with transient global amnesia and somnolence. Patient was seen by neurology and extensive dementia workup unrevealing as well as psychiatric workup. After discussion between patient's son and his oncologist, there is concern that immunotherapy could be causing his symptoms. It was felt best that patient come in for IV steroids.  If this treatment did not work, hospice options would be looked at. patient started on IV Solu-Medrol high-dose and brought in under hospitalist service  Patient today actually notes some improvement. Interacting appropriately. Patient's son feels like he is doing a little bit better  Assessment/Plan: Active Problems: Stage III chronic kidney disease: At baseline History of urothelial cancer, being followed by oncology    Encephalopathy/transient global amnesia:   Code Status:Full code   Family Communication: son at the bedside  Disposition Plan:continued IV steroids for several more days     Consultants:  Oncology  Procedures:  None    Antibiotics: None   Objective: BP 130/76 mmHg  Pulse 69  Temp(Src) 98.1 F (36.7 C) (Oral)  Resp 15  Ht 5\' 8"  (1.727 m)  Wt 72.303 kg (159 lb 6.4 oz)  BMI 24.24 kg/m2  SpO2 100% No intake or output data in the 24 hours ending 07/05/15 1444 Filed Weights   07/04/15 2111 07/05/15 0135  Weight: 71.668 kg (158 lb) 72.303 kg (159 lb 6.4 oz)    Exam:   General:  Alert and oriented 2, slightly withdrawn    Cardiovascular: Regular rate and rhythm, S1-S2    Respiratory: Clear to auscultation bilaterally    Abdomen: Soft, nontender, nondistended, positive bowel sounds    Musculoskeletal: Trace  pitting edema bilaterally     Data Reviewed: Basic Metabolic Panel:  Recent Labs Lab 06/29/15 1055 06/29/15 1515 06/30/15 0606 07/01/15 0550 07/02/15 0642 07/04/15 2340  NA 137 137 138 138 138 134*  K 3.6 3.4* 3.4* 3.8 3.6 3.8  CL 99* 100* 102 101 104 98*  CO2 25  --  26 27 27 26   GLUCOSE 222* 82 111* 116* 110* 110*  BUN 28* 25* 23* 21* 16 24*  CREATININE 2.14* 1.70* 1.89* 1.67* 1.67* 1.87*  CALCIUM 9.0  --  8.3* 8.7* 8.3* 8.7*   Liver Function Tests:  Recent Labs Lab 06/29/15 1055 06/30/15 0606 07/04/15 2340  AST 29 27 43*  ALT 16* 14* 35  ALKPHOS 56 60 82  BILITOT 1.0 0.9 1.0  PROT 6.7 6.1* 6.6  ALBUMIN 3.7 3.4* 3.4*   No results for input(s): LIPASE, AMYLASE in the last 168 hours.  Recent Labs Lab 06/29/15 1300  AMMONIA 19   CBC:  Recent Labs Lab 06/29/15 1055 06/29/15 1515 06/30/15 0606 07/01/15 0550 07/04/15 2340  WBC 7.3  --  8.0 8.5 5.8  NEUTROABS  --   --   --   --  4.1  HGB 13.5 14.3 13.6 14.7 12.3*  HCT 39.9 42.0 41.1 43.9 37.3*  MCV 86.4  --  86.7 86.8 86.5  PLT 179  --  174 181 164   Cardiac Enzymes:   No results for input(s): CKTOTAL, CKMB, CKMBINDEX, TROPONINI in the last 168 hours. BNP (last 3 results) No results for input(s): BNP in the last 8760 hours.  ProBNP (last 3  results) No results for input(s): PROBNP in the last 8760 hours.  CBG: No results for input(s): GLUCAP in the last 168 hours.  No results found for this or any previous visit (from the past 240 hour(s)).   Studies: No results found.  Scheduled Meds: . enoxaparin (LOVENOX) injection  40 mg Subcutaneous Q24H  . methylPREDNISolone (SOLU-MEDROL) injection  140 mg Intravenous Q24H    Continuous Infusions: . sodium chloride 75 mL/hr at 07/05/15 0345     Time spent: 15 minutes   [facility] Pager [phone] . If 7PM-7AM, please contact night-coverage at [email or web address], password [facility] 07/05/2015, 2:44 PM  LOS: 0 days

## 2015-07-06 LAB — BASIC METABOLIC PANEL
Anion gap: 10 (ref 5–15)
BUN: 26 mg/dL — AB (ref 6–20)
CALCIUM: 8.7 mg/dL — AB (ref 8.9–10.3)
CO2: 26 mmol/L (ref 22–32)
Chloride: 101 mmol/L (ref 101–111)
Creatinine, Ser: 1.71 mg/dL — ABNORMAL HIGH (ref 0.61–1.24)
GFR calc Af Amer: 48 mL/min — ABNORMAL LOW (ref >60)
GFR, EST NON AFRICAN AMERICAN: 41 mL/min — AB (ref >60)
GLUCOSE: 153 mg/dL — AB (ref 65–99)
POTASSIUM: 3.8 mmol/L (ref 3.5–5.1)
SODIUM: 137 mmol/L (ref 135–145)

## 2015-07-06 NOTE — Progress Notes (Signed)
[facility] PROGRESS NOTE  DELMOS ZAHORIK [id number] DOB: 10-18-1950 DOA: 07/04/2015 PCP: Wende Neighbors, MD  Assessment/Plan: SubAcute Encephalopathy -Recent work up and believed to have transient global amnesia. -Now has been readmitted per oncology recommendations for high dose IV steroids thinking this might be a reaction to his immunetherapy. -He does not appear cognitively different today than he did last week to me. -Request inpatient onc consult.  H/o Bladder Cancer -F/u with oncology  Acute on CKD Stage III -Baseline Cr 1.67. -Improving.  Code Status: FUll Code Family Communication: patient only  Disposition Plan: to be determined   Consultants:  Onc   Antibiotics:  none   Subjective: In bed, sleeping, not interactive  Objective: Filed Vitals:   07/05/15 1452 07/05/15 1618 07/05/15 2225 07/06/15 0628  BP: 124/72  150/85 150/92  Pulse: 77  71 66  Temp: 98.1 F (36.7 C)  98.2 F (36.8 C) 98 F (36.7 C)  TempSrc:   Oral Oral  Resp: 16   16  Height:      Weight:      SpO2: 98% 97% 94% 94%    Intake/Output Summary (Last 24 hours) at 07/06/15 1315 Last data filed at 07/06/15 0900  Gross per 24 hour  Intake 1408.75 ml  Output      0 ml  Net 1408.75 ml   Filed Weights   07/04/15 2111 07/05/15 0135  Weight: 71.668 kg (158 lb) 72.303 kg (159 lb 6.4 oz)    Exam:   General:  drowsy  Cardiovascular: RRR  Respiratory: CTA B  Abdomen: S/NT/ND/+BS  Extremities: no C/CE   Neurologic:  Unable to assess given current mental status  Data Reviewed: Basic Metabolic Panel:  Recent Labs Lab 06/30/15 0606 07/01/15 0550 07/02/15 0642 07/04/15 2340 07/06/15 0639  NA 138 138 138 134* 137  K 3.4* 3.8 3.6 3.8 3.8  CL 102 101 104 98* 101  CO2 26 27 27 26 26   GLUCOSE 111* 116* 110* 110* 153*  BUN 23* 21* 16 24* 26*  CREATININE 1.89* 1.67* 1.67* 1.87* 1.71*  CALCIUM 8.3* 8.7* 8.3* 8.7* 8.7*   Liver Function Tests:  Recent  Labs Lab 06/30/15 0606 07/04/15 2340  AST 27 43*  ALT 14* 35  ALKPHOS 60 82  BILITOT 0.9 1.0  PROT 6.1* 6.6  ALBUMIN 3.4* 3.4*   No results for input(s): LIPASE, AMYLASE in the last 168 hours. No results for input(s): AMMONIA in the last 168 hours. CBC:  Recent Labs Lab 06/29/15 1515 06/30/15 0606 07/01/15 0550 07/04/15 2340  WBC  --  8.0 8.5 5.8  NEUTROABS  --   --   --  4.1  HGB 14.3 13.6 14.7 12.3*  HCT 42.0 41.1 43.9 37.3*  MCV  --  86.7 86.8 86.5  PLT  --  174 181 164   Cardiac Enzymes: No results for input(s): CKTOTAL, CKMB, CKMBINDEX, TROPONINI in the last 168 hours. BNP (last 3 results) No results for input(s): BNP in the last 8760 hours.  ProBNP (last 3 results) No results for input(s): PROBNP in the last 8760 hours.  CBG: No results for input(s): GLUCAP in the last 168 hours.  No results found for this or any previous visit (from the past 240 hour(s)).   Studies: No results found.  Scheduled Meds: . enoxaparin (LOVENOX) injection  40 mg Subcutaneous Q24H  . methylPREDNISolone (SOLU-MEDROL) injection  140 mg Intravenous Q24H   Continuous Infusions: . sodium chloride 50 mL/hr at 07/05/15 1729  Active Problems:   CKD (chronic kidney disease), stage III   Encephalopathy   Transient amnesia    Time spent: 25 minutes. Greater than 50% of this time was spent in direct contact with the patient coordinating care.    Lelon Frohlich  [facility] Pager [phone]  If 7PM-7AM, please contact night-coverage at [email or web address], password [facility] 07/06/2015, 1:15 PM  LOS: 1 day

## 2015-07-07 ENCOUNTER — Ambulatory Visit (HOSPITAL_COMMUNITY): Payer: Commercial Managed Care - HMO | Admitting: Hematology & Oncology

## 2015-07-07 ENCOUNTER — Other Ambulatory Visit (HOSPITAL_COMMUNITY): Payer: Self-pay | Admitting: Oncology

## 2015-07-07 ENCOUNTER — Encounter (HOSPITAL_COMMUNITY): Payer: Self-pay | Admitting: Primary Care

## 2015-07-07 DIAGNOSIS — Z7189 Other specified counseling: Secondary | ICD-10-CM | POA: Insufficient documentation

## 2015-07-07 DIAGNOSIS — N183 Chronic kidney disease, stage 3 (moderate): Secondary | ICD-10-CM

## 2015-07-07 DIAGNOSIS — C679 Malignant neoplasm of bladder, unspecified: Secondary | ICD-10-CM

## 2015-07-07 DIAGNOSIS — Z515 Encounter for palliative care: Secondary | ICD-10-CM | POA: Insufficient documentation

## 2015-07-07 DIAGNOSIS — R4182 Altered mental status, unspecified: Secondary | ICD-10-CM

## 2015-07-07 DIAGNOSIS — C7910 Secondary malignant neoplasm of unspecified urinary organs: Secondary | ICD-10-CM

## 2015-07-07 DIAGNOSIS — G9340 Encephalopathy, unspecified: Secondary | ICD-10-CM

## 2015-07-07 LAB — GLUCOSE, CAPILLARY
GLUCOSE-CAPILLARY: 143 mg/dL — AB (ref 65–99)
GLUCOSE-CAPILLARY: 107 mg/dL — AB (ref 65–99)
GLUCOSE-CAPILLARY: 97 mg/dL (ref 65–99)

## 2015-07-07 MED ORDER — INSULIN ASPART 100 UNIT/ML ~~LOC~~ SOLN
0.0000 [IU] | Freq: Three times a day (TID) | SUBCUTANEOUS | Status: DC
  Administered 2015-07-07: 1 [IU] via SUBCUTANEOUS
  Administered 2015-07-08: 3 [IU] via SUBCUTANEOUS
  Administered 2015-07-08: 1 [IU] via SUBCUTANEOUS
  Administered 2015-07-08: 2 [IU] via SUBCUTANEOUS
  Administered 2015-07-09: 3 [IU] via SUBCUTANEOUS
  Administered 2015-07-09 – 2015-07-10 (×3): 1 [IU] via SUBCUTANEOUS

## 2015-07-07 NOTE — Progress Notes (Signed)
Jesus [name] [id number] DOB: 11/02/1950 DOA: 07/04/2015 PCP: Wende Neighbors, MD  HPI/Recap of past 80 hours: 65 year old male with past oral history of depression, hyperlipidemia and  metastatic urothelial carcinoma who had been started on immunotherapy have been suffering in the last few weeks with transient global amnesia and somnolence. Patient was seen by neurology and extensive dementia workup unrevealing as well as psychiatric workup. After discussion between patient's son and his oncologist, there is concern that immunotherapy could be causing his symptoms. It was felt best that patient come in for IV steroids.  If this treatment did not work, hospice options would be looked at. patient started on IV Solu-Medrol high-dose and brought in under hospitalist service  After several days, patient doing about the same. He did have an event early this morning where he was found out of bed and slid to the floor. Did not look like he had fallen. Patient's son feels that he has not really made much improvement.  Assessment/Plan: Active Problems: Stage III chronic kidney disease: At baseline History of urothelial cancer, being followed by oncology.  In discussion with them, they would like for skilled nursing to be involved and to continue IV steroids for several more days, which we are working to arrange.    Encephalopathy/transient global amnesia: Possibly secondary to above. May also be progression of disease   Code Status:Full code   Family Communication: son at the bedside  Disposition Plan:continued IV steroids for several more days , if facility allows it, this may be able to be done there    Consultants:  Oncology  Procedures:  None   Antibiotics: None   Objective: BP 136/91 mmHg  Pulse 72  Temp(Src) 97.9 F (36.6 C) (Oral)  Resp 18  Ht 5\' 8"  (1.727 m)  Wt 72.303 kg (159 lb 6.4 oz)  BMI 24.24 kg/m2  SpO2 97%  Intake/Output Summary (Last 24 hours)  at 07/07/15 1522 Last data filed at 07/07/15 1258  Gross per 24 hour  Intake 1593.91 ml  Output      0 ml  Net 1593.91 ml   Filed Weights   07/04/15 2111 07/05/15 0135  Weight: 71.668 kg (158 lb) 72.303 kg (159 lb 6.4 oz)    Exam: Unchanged from previous  General:  Alert and oriented 2, slightly withdrawn    Cardiovascular: Regular rate and rhythm, S1-S2    Respiratory: Clear to auscultation bilaterally    Abdomen: Soft, nontender, nondistended, positive bowel sounds    Musculoskeletal: Trace pitting edema bilaterally     Data Reviewed: Basic Metabolic Panel:  Recent Labs Lab 07/01/15 0550 07/02/15 0642 07/04/15 2340 07/06/15 0639  NA 138 138 134* 137  K 3.8 3.6 3.8 3.8  CL 101 104 98* 101  CO2 27 27 26 26   GLUCOSE 116* 110* 110* 153*  BUN 21* 16 24* 26*  CREATININE 1.67* 1.67* 1.87* 1.71*  CALCIUM 8.7* 8.3* 8.7* 8.7*   Liver Function Tests:  Recent Labs Lab 07/04/15 2340  AST 43*  ALT 35  ALKPHOS 82  BILITOT 1.0  PROT 6.6  ALBUMIN 3.4*   No results for input(s): LIPASE, AMYLASE in the last 168 hours. No results for input(s): AMMONIA in the last 168 hours. CBC:  Recent Labs Lab 07/01/15 0550 07/04/15 2340  WBC 8.5 5.8  NEUTROABS  --  4.1  HGB 14.7 12.3*  HCT 43.9 37.3*  MCV 86.8 86.5  PLT 181 164   Cardiac Enzymes:   No results  for input(s): CKTOTAL, CKMB, CKMBINDEX, TROPONINI in the last 168 hours. BNP (last 3 results) No results for input(s): BNP in the last 8760 hours.  ProBNP (last 3 results) No results for input(s): PROBNP in the last 8760 hours.  CBG:  Recent Labs Lab 07/07/15 1118  GLUCAP 143*    No results found for this or any previous visit (from the past 240 hour(s)).   Studies: No results found.  Scheduled Meds: . enoxaparin (LOVENOX) injection  40 mg Subcutaneous Q24H  . insulin aspart  0-9 Units Subcutaneous TID WC  . methylPREDNISolone (SOLU-MEDROL) injection  140 mg Intravenous Q24H    Continuous  Infusions: . sodium chloride 1,000 mL (07/07/15 1203)     Time spent: 15 minutes   [facility] Pager [phone] . If 7PM-7AM, please contact night-coverage at [email or web address], password [facility] 07/07/2015, 3:22 PM  LOS: 2 days

## 2015-07-07 NOTE — Consult Note (Signed)
[facility] Hematology/Oncology Consultation   [name]: Jesus [name]      MRN: [id number]    Location: A330/A330-01  Date: 07/07/2015 Time:5:22 PM   REFERRING PHYSICIAN:  Erline Hau, MD  REASON FOR CONSULT:  Confusion   DIAGNOSIS:  Stage IV Metastatic urothelial cancer, S/P 1 treatment with Atezolizumab on 06/24/2015  HISTORY OF PRESENT ILLNESS:   Jesus [name] is a 65 yo white American man who is well known to the [facility] where he has been receiving his oncology care for metastatic urothelial cancer.  He was re-admitted after the patient's son, Jesus [name], reported a decline in the patient's condition since being discharged.  I therefore worked with the Hospitalist group and ED physician to have the patient admitted back locally with plan on a trial of IV solumedrol in the event that Atezolizumab created an encephalitis and transitioning patient to SNF locally with Hospice intervention.    Metastatic urothelial carcinoma ([place])   07/09/2014 Imaging  CT A/P Irregular and lobular bladder wall thickening suspicious for transitional cell carcinoma. Considerable associated pelvic and retroperitoneal adenopathy. 1.4 by 1.3 cm complex and likely enhancing lesion of the right hepatic lobe dome   07/16/2014 - 07/19/2014 Hospital Admission Hospital admission for TURP. Course complicated by ARF   7/86/7672 Procedure TRANSURETHRAL RESECTION OF BLADDER TUMOR WITH GYRUS (TURBT-GYRUS) CYSTOSCOPY WITH PROSTATE ULTRASOUND AND BIOPSY.  Dr. Irine Seal   08/08/2014 PET scan Irregular bladder wall thickening, compatible with known primary bladder neoplasm. Associated mild left hydroureteronephrosis. Extensive retroperitoneal nodal metastases. Associated pelvic implant, suspicious for peritoneal disease.2.3 metastasis liver   08/23/2014 - 10/10/2014 Chemotherapy Carboplatin and Gemzar day 1 and 8 given in the palliative setting   10/10/2014 Treatment Plan Change Changing chemotherapy to  every other week and cycle length to 28 day cycle   10/10/2014 - 01/23/2015 Chemotherapy Carboplatin/Gemzar day 1 and 15 every 28 days in the palliative setting x 6 cycles.   11/04/2014 PET scan Interval complete metabolic response to therapy.  Residual, borderline left periaortic and right external iliac lymph nodes exhibit low level, nonspecific FDG uptake.   02/20/2015 Remission PET- No evidence of local bladder cancer recurrence or distant metastatic disease. Small RIGHT lung pulmonary nodules are stable.   06/06/2015 Progression PET- definitive evidence of recurrence of disease, with a large left-sided bladder wall mass, infiltrated into the surrounding soft tissues, with extensive surrounding lymphadenopathy throughout the soft tissues of the pelvic floor, along the left pelvic   06/24/2015 -  Chemotherapy Atezolizumab   06/29/2015 -  Hospital Admission Acute altered mental status   06/29/2015 Imaging CT head- No acute abnormality.  Cortical atrophy.   06/30/2015 Imaging MRI brain- Exam is motion degraded. No acute infarct or intracranial hemorrhage. Moderate to marked global atrophy most notable cerebellum without hydrocephalus. Mild nonspecific periventricular white matter changes. No intracranial mass.    I personally reviewed and went over laboratory results with the patient.  The results are noted within this dictation.  Patient continues to be confused.  We spent a significant time in discussion with the patient's son, Jesus [name].  He is educated on the patient's situation and our thoughts from an oncology standpoint.  Patient denies any pain, he denies any urinary pain or frequency.  He denies any pelvis pain.   PAST MEDICAL HISTORY:   Past Medical History  Diagnosis Date  . High cholesterol   . Depression     hx of   . Arthritis   .  Bladder cancer ([place])   . Metastatic urothelial carcinoma ([place]) 07/16/2014    ALLERGIES: No Known Allergies    MEDICATIONS: I have reviewed the patient's  current medications.    No current facility-administered medications on file prior to encounter.   Current Outpatient Prescriptions on File Prior to Encounter  Medication Sig Dispense Refill  . ondansetron (ZOFRAN) 8 MG tablet Take 1 tablet (8 mg total) by mouth every 8 (eight) hours as needed for nausea or vomiting. Reported on 06/24/2015 20 tablet 0  . prochlorperazine (COMPAZINE) 10 MG tablet Take 1 tablet (10 mg total) by mouth every 6 (six) hours as needed for refractory nausea / vomiting. Reported on 06/24/2015 15 tablet 0  . predniSONE (DELTASONE) 20 MG tablet Take 60 mg daily PO.  If no improvement in 7 days, please taper according to instructions. (Patient not taking: Reported on 07/04/2015) 45 tablet 0     PAST SURGICAL HISTORY Past Surgical History  Procedure Laterality Date  . Knee surgery    . Tonsillectomy    . Transurethral resection of bladder tumor with gyrus (turbt-gyrus) N/A 07/16/2014    Procedure: TRANSURETHRAL RESECTION OF BLADDER TUMOR WITH GYRUS (TURBT-GYRUS);  Surgeon: Malka So, MD;  Location: [facility];  Service: Urology;  Laterality: N/A;  . Cystoscopy with biopsy N/A 07/16/2014    Procedure: CYSTOSCOPY WITH PROSTATE ULTRASOUND AND BIOPSY;  Surgeon: Malka So, MD;  Location: [facility];  Service: Urology;  Laterality: N/A;  . Transurethral resection of bladder tumor N/A 06/19/2015    Procedure: TRANSURETHRAL RESECTION OF BLADDER TUMOR (TURBT);  Surgeon: Irine Seal, MD;  Location: [facility];  Service: Urology;  Laterality: N/A;    FAMILY HISTORY: Family History  Problem Relation Age of Onset  . Cancer Mother   . Stroke Father   . Cancer Sister     SOCIAL HISTORY:  reports that he has never smoked. He has never used smokeless tobacco. He reports that he does not drink alcohol or use illicit drugs.  PERFORMANCE STATUS: The patient's performance status is 2 - Symptomatic, <50% confined to bed  PHYSICAL EXAM: Most Recent Vital Signs: Blood pressure 136/91, pulse 72,  temperature 97.9 F (36.6 C), temperature source Oral, resp. rate 18, height _0  (1.727 m), weight 159 lb 6.4 oz (72.303 kg), SpO2 97 %. General appearance: alert, cooperative, appears stated age, no distress and son Jesus [name] at the bedside. Head: Normocephalic, without obvious abnormality, atraumatic Eyes: negative findings: lids and lashes normal, conjunctivae and sclerae normal and corneas clear Lungs: clear to auscultation bilaterally Heart: regular rate and rhythm, S1, S2 normal, no murmur, click, rub or gallop Abdomen: soft, non-tender; bowel sounds normal; no masses,  no organomegaly Extremities: extremities normal, atraumatic, no cyanosis or edema Skin: Skin color, texture, turgor normal. No rashes or lesions Neurologic: Mental status: alertness: alert, orientation: person, place, affect: increased in intensity, thought content exhibits loose associations, flight of ideas  LABORATORY DATA:  Results for orders placed or performed during the hospital encounter of 07/04/15 (from the past 48 hour(s))  Basic metabolic panel     Status: Abnormal   Collection Time: 07/06/15  6:39 AM  Result Value Ref Range   Sodium 137 135 - 145 mmol/L   Potassium 3.8 3.5 - 5.1 mmol/L   Chloride 101 101 - 111 mmol/L   CO2 26 22 - 32 mmol/L   Glucose, Bld 153 (H) 65 - 99 mg/dL   BUN 26 (H) 6 - 20 mg/dL   Creatinine, Ser 1.71 (  H) 0.61 - 1.24 mg/dL   Calcium 8.7 (L) 8.9 - 10.3 mg/dL   GFR calc non Af Amer 41 (L) >60 mL/min   GFR calc Af Amer 48 (L) >60 mL/min    Comment: (NOTE) The eGFR has been calculated using the CKD EPI equation. This calculation has not been validated in all clinical situations. eGFR's persistently <60 mL/min signify possible Chronic Kidney Disease.    Anion gap 10 5 - 15  Glucose, capillary     Status: Abnormal   Collection Time: 07/07/15 11:18 AM  Result Value Ref Range   Glucose-Capillary 143 (H) 65 - 99 mg/dL  Glucose, capillary     Status: Abnormal   Collection Time:  07/07/15  4:25 PM  Result Value Ref Range   Glucose-Capillary 107 (H) 65 - 99 mg/dL      RADIOGRAPHY: No results found.     PATHOLOGY:  NONE  ASSESSMENT/PLAN:   Stage IV Urothelial Cancer, S/P 6 cycles of Carboplatin/Gemcitabine with a CR, but with recent relapse and progression of disease.  He was started on second line therapy consisting of Atezolizumab 06/24/2015.  He was subsequently admitted to the hospital with mental status change and essentially a negative inpatient work-up.  He was discharged on 07/02/2015 to his son, Jesus [name] in [place], [place].  Mark and I made contact for an update on the patient on Friday, 07/04/2015 after the patient was a no-show for his 07/03/2015 follow-up appointment in the clinic for follow-up after first cycle of Atezolizumab.  I learned that the patient's mental status was worsening and therefore, we arranged for the patient to be admitted to the hospital locally here with plans on transitioning him to SNF with Hospice.  In the interim, we started high-dose IV solumedrol on admission in the event that his mental status change was secondary to Atezolizumab induced encephalitis.    No improvement noted today on exam.  Recommend SNF placement with Hospice.  Would recommend continuation of IV solumedrol x 10-14 days.  If no improvement, recommend taper over 4 weeks with oral steroids and discontinue.  Again, note, we are not positive that Atezolizumab is the cause of the patient's current mental status.  We otherwise, cannot explain the patient's mental status changes other than progression of malignancy or surgery induced with noted longer recovery from anesthesia-induced confusion.  It should be noted that the patient's son confirms that prior to the patient's Atezolizumab treatment, he noted a little change from a mental status change, but he attributed it to learning of the progression of disease and his coping with this information.  The day of his treatment, was the first  time in nearly 1 year he was late for any of his cancer center appointments.  Upon discharged from the hospital, please verify continuation of IV solumedrol x 10-14 days 140 mg daily.  Please make sure he has some PRN pain medication as well.  Recommend SNF placement with Hospice intervention.  Outpatient follow-up at the [facility] in 2 weeks to help guide steroid taper of corticosteroids over 4 weeks if not better and for re-evaluation.  He is not a candidate for further systemic chemotherapy in the patients current condition  Patient and plan discussed with Dr. Ancil Linsey and she is in agreement with the aforementioned.   This note is electronically signed by: Doy Mince 07/07/2015 5:22 PM   As above. Patient seen and examined.  Very difficult to explain patient's current change in MS. Clearly not his  baseline. Prior to starting new therapy patient had some minor deviations in his usual behavior. Would continue steroids as ordered. Hospice consultation and intervention is recommended and the family is agreeable. We will help with steroid taper by seeing in clinic in next 10 days to 2 weeks or can manage via hospice. Donald Pore MD

## 2015-07-07 NOTE — Consult Note (Signed)
Consultation Note Date: 07/07/2015   Patient Name: Jesus [name]  DOB: September 19, 1950  MRN: [id number]  Age / Sex: 65 y.o., male  PCP: Celene Squibb, MD Referring Physician: Annita Brod, MD  Reason for Consultation: Disposition, Establishing goals of care and Psychosocial/spiritual support  Clinical Assessment/Narrative: Jesus [name] is resting in bed with his son Jesus [name] at his side.  He denies pain, but is "fidgety" moving often, he tells me that he is not uncomfortable and denies needs for pain meds.   He is unable to have a continued conversation with me, and gives the ok to talk with his son.  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  He shares that until around December first, Jesus [name] was independent.  He is now sleeping a lot, with short periods of alertness.  Jesus [name] talks about a "significant decline" since December.  Jesus [name] shares his worry that Jesus [name] will not be able to care for himself at home.  We talk about going to SNF for rehab, see how he is able to improve, regain strength, and give them time to make a plan for the future.    Jesus [name] tells me that his greatest worry is 'the unknown'.  Jesus [name] worries about the need for more care, and placement/safety.  We discuss the concepts of Hospice, but that he does not seem ready for in patient hospice home at this time.  We talk about rehab in the SNF, then transition to Hospice there if he becomes resident with Medicaid.  Jesus [name] states he is aware that he needs to apply for Medicaid for Jesus [name].  Jesus [name] seems at times, reluctant to say 'cancer' or talk about end of life.    We talk about AD and the realities of chest compressions, defibrillation, and intubation.  I share my worry that he would have to be very sick to need this, and it would not change his cancer diagnosis.  Jesus [name] elects to allow a natural death and Jesus [name] is in agreement.    Contacts/Participants in Discussion: Jesus [name] and his son Jesus [name] Primary Decision Maker: Jesus [name] when he is able, other times son Jesus [name].    Relationship to Patient Son HCPOA: yes  No durable POA, No medicaid but Jesus [name] is applying soon.   SUMMARY OF RECOMMENDATIONS  Code Status/Advance Care Planning: DNR, we discuss the realities of chest compressions, defibrillation, and intubation.     Code Status Orders        Start     Ordered   07/07/15 1340  Do not attempt resuscitation (DNR)   Continuous    Question Answer Comment  In the event of cardiac or respiratory ARREST Do not call a "code blue"   In the event of cardiac or respiratory ARREST Do not perform Intubation, CPR, defibrillation or ACLS   In the event of cardiac or respiratory ARREST Use medication by any route, position, wound care, and other measures to relive pain and suffering. May use oxygen, suction and manual treatment of airway obstruction as needed for comfort.      07/07/15 1339    Code Status History    Date Active Date Inactive Code Status Order ID Comments User Context   07/05/2015  1:34 AM 07/07/2015  1:39 PM Full Code [id number]  Oswald Hillock, MD Inpatient   06/29/2015  5:12 PM 07/02/2015  8:00 PM Full Code [id number]  Doree Albee, MD Inpatient   06/19/2015 12:32 PM 06/20/2015  4:19 PM  Full Code [id number]  Irine Seal, MD Inpatient   08/21/2014  1:56 PM 08/22/2014  3:34 AM Full Code [id number]  Jacqulynn Cadet, MD HOV   07/16/2014  6:15 PM 07/19/2014  1:19 PM Full Code [id number]  Malka So, MD Inpatient    Advance Directive Documentation        Most Recent Value   Type of Advance Directive  Healthcare Power of Attorney   Pre-existing out of facility DNR order (yellow form or pink MOST form)     "MOST" Form in Place?        Other Directives:None  Symptom Management:   Per hospitalist  Palliative Prophylaxis:   Frequent Pain Assessment, Oral Care and Turn Reposition  Additional Recommendations  (Limitations, Scope, Preferences):  Treat the treatable.   Psycho-social/Spiritual:  Support System: Strong, lives alone, has family in town, son lives in [place].  Desire for further Chaplaincy support:Not discussed today.  Additional Recommendations: None today.   Prognosis: Unable to determine, based on outcomes, likely 6 months or less.   Discharge Planning: [place] for rehab with Palliative care service follow-up   Chief Complaint/ Primary Diagnoses: Present on Admission:  . Encephalopathy . CKD (chronic kidney disease), stage III  I have reviewed the medical record, interviewed the patient and family, and examined the patient. The following aspects are pertinent.  Past Medical History  Diagnosis Date  . High cholesterol   . Depression     hx of   . Arthritis   . Bladder cancer ([place])   . Metastatic urothelial carcinoma ([place]) 07/16/2014   Social History   Social History  . Marital Status: Single    Spouse Name: N/A  . Number of Children: N/A  . Years of Education: N/A   Social History Main Topics  . Smoking status: Never Smoker   . Smokeless tobacco: Never Used  . Alcohol Use: No  . Drug Use: No  . Sexual Activity: Not Asked     Comment: Divorced. 1 son who lives in [place].  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 Non smoker, no tobacco use. No significant ETOH use   Other Topics Concern  . None   Social History Narrative   Family History  Problem Relation Age of Onset  . Cancer Mother   . Stroke Father   . Cancer Sister    Scheduled Meds: . enoxaparin (LOVENOX) injection  40 mg Subcutaneous Q24H  . insulin aspart  0-9 Units Subcutaneous TID WC  . methylPREDNISolone (SOLU-MEDROL) injection  140 mg Intravenous Q24H   Continuous Infusions: . sodium chloride 1,000 mL (07/07/15 1203)   PRN Meds:.acetaminophen **OR** acetaminophen, ondansetron **OR** ondansetron (ZOFRAN) IV Medications Prior to Admission:  Prior to Admission  medications   Medication Sig Start Date End Date Taking? Authorizing Provider  FOLIC ACID PO Take 1 tablet by mouth daily.   Yes Historical Provider, MD  lidocaine-prilocaine (EMLA) cream Apply 1 application topically as needed (for port access).   Yes Historical Provider, MD  ondansetron (ZOFRAN) 8 MG tablet Take 1 tablet (8 mg total) by mouth every 8 (eight) hours as needed for nausea or vomiting. Reported on 06/24/2015 07/02/15  Yes Estela Leonie Green, MD  prochlorperazine (COMPAZINE) 10 MG tablet Take 1 tablet (10 mg total) by mouth every 6 (six) hours as needed for refractory nausea / vomiting. Reported on 06/24/2015 07/02/15  Yes Estela Leonie Green, MD  predniSONE (DELTASONE) 20 MG tablet Take 60 mg daily PO.  If no improvement in 7 days, please taper according to instructions. Patient not taking: Reported on 07/04/2015 07/04/15   Baird Cancer, PA-C   No Known Allergies  Review of Systems  Physical Exam  Vital Signs: BP 136/91 mmHg  Pulse 72  Temp(Src) 97.9 F (36.6 C) (Oral)  Resp 18  Ht 5\' 8"  (1.727 m)  Wt 72.303 kg (159 lb 6.4 oz)  BMI 24.24 kg/m2  SpO2 97%  SpO2: SpO2: 97 % O2 Device:SpO2: 97 % O2 Flow Rate: .   IO: Intake/output summary:  Intake/Output Summary (Last 24 hours) at 07/07/15 1713 Last data filed at 07/07/15 1258  Gross per 24 hour  Intake 1593.91 ml  Output      0 ml  Net 1593.91 ml    LBM: Last BM Date: 07/05/15 Baseline Weight: Weight: 71.668 kg (158 lb) Most recent weight: Weight: 72.303 kg (159 lb 6.4 oz)      Palliative Assessment/Data:  Flowsheet Rows        Most Recent Value   Intake Tab    Referral Department  Hospitalist   Unit at Time of Referral  Med/Surg Unit   Palliative Care Primary Diagnosis  Other (Comment)   Date Notified  07/07/15   Palliative Care Type  New Palliative care   Reason for referral  Clarify Goals of Care, Psychosocial or Spiritual support, Advance Care Planning   Date of Admission  07/04/15   Date  first seen by Palliative Care  07/07/15   # of days Palliative referral response time  0 Day(s)   # of days IP prior to Palliative referral  3   Clinical Assessment    Palliative Performance Scale Score  40%   Pain Max last 24 hours  2   Pain Min Last 24 hours  0   Dyspnea Max Last 24 Hours  2   Dyspnea Min Last 24 hours  0   Psychosocial & Spiritual Assessment    Social Work Plan of Care  Staff support, Clarified patient/family wishes with healthcare team   Palliative Care Outcomes    Patient/Family meeting held?  Yes   Who was at the meeting?  Jesus [name] and his son Jesus [name].    Palliative Care Outcomes  Clarified goals of care, Counseled regarding hospice, Provided advance care planning   Patient/Family wishes: Interventions discontinued/not started   Mechanical Ventilation, PEG   Palliative Care follow-up planned  Yes, Facility   Other Treatment Preference Instructions  plan dc to SNF for rehab      Additional Data Reviewed:  CBC:    Component Value Date/Time   WBC 5.8 07/04/2015 2340   HGB 12.3* 07/04/2015 2340   HCT 37.3* 07/04/2015 2340   PLT 164 07/04/2015 2340   MCV 86.5 07/04/2015 2340   NEUTROABS 4.1 07/04/2015 2340   LYMPHSABS 1.2 07/04/2015 2340   MONOABS 0.4 07/04/2015 2340   EOSABS 0.1 07/04/2015 2340   BASOSABS 0.0 07/04/2015 2340   Comprehensive Metabolic Panel:    Component Value Date/Time   NA 137 07/06/2015 0639   K 3.8 07/06/2015 0639   CL 101 07/06/2015 0639   CO2 26 07/06/2015 0639   BUN 26* 07/06/2015 0639   CREATININE 1.71* 07/06/2015 0639   GLUCOSE 153* 07/06/2015 0639   CALCIUM 8.7* 07/06/2015 0639   AST 43* 07/04/2015 2340   ALT 35 07/04/2015 2340   ALKPHOS 82 07/04/2015 2340   BILITOT 1.0 07/04/2015 2340   PROT 6.6 07/04/2015 2340   ALBUMIN  3.4* 07/04/2015 2340     Time In: 1030 Time Out: 1130 Time Total: 60 minutes Greater than 50%  of this time was spent counseling and coordinating care related to the above assessment  and plan.  Signed by: Drue Novel, NP  Drue Novel, NP  07/07/2015, 5:13 PM  Please contact Palliative Medicine Team phone at [phone] for questions and concerns.

## 2015-07-07 NOTE — Progress Notes (Signed)
Pt getting out of bed unassisted, found sitting in the floor. No injury noted and no complaint of pain. Pt pulled tubing to [facility] a cath apart. Port deaccessed and then reaccessed.

## 2015-07-08 ENCOUNTER — Inpatient Hospital Stay (HOSPITAL_COMMUNITY): Payer: Commercial Managed Care - HMO

## 2015-07-08 ENCOUNTER — Ambulatory Visit (HOSPITAL_COMMUNITY): Payer: Self-pay | Admitting: Oncology

## 2015-07-08 LAB — BASIC METABOLIC PANEL
Anion gap: 10 (ref 5–15)
BUN: 32 mg/dL — AB (ref 6–20)
CALCIUM: 8.9 mg/dL (ref 8.9–10.3)
CO2: 27 mmol/L (ref 22–32)
Chloride: 101 mmol/L (ref 101–111)
Creatinine, Ser: 1.65 mg/dL — ABNORMAL HIGH (ref 0.61–1.24)
GFR calc Af Amer: 50 mL/min — ABNORMAL LOW (ref >60)
GFR calc non Af Amer: 43 mL/min — ABNORMAL LOW (ref >60)
GLUCOSE: 152 mg/dL — AB (ref 65–99)
POTASSIUM: 3.9 mmol/L (ref 3.5–5.1)
SODIUM: 138 mmol/L (ref 135–145)

## 2015-07-08 LAB — GLUCOSE, CAPILLARY
GLUCOSE-CAPILLARY: 134 mg/dL — AB (ref 65–99)
GLUCOSE-CAPILLARY: 169 mg/dL — AB (ref 65–99)
Glucose-Capillary: 234 mg/dL — ABNORMAL HIGH (ref 65–99)
Glucose-Capillary: 146 mg/dL — ABNORMAL HIGH (ref 65–99)

## 2015-07-08 MED ORDER — LORAZEPAM 2 MG/ML IJ SOLN
1.0000 mg | Freq: Once | INTRAMUSCULAR | Status: AC
  Administered 2015-07-08: 1 mg via INTRAVENOUS
  Filled 2015-07-08: qty 1

## 2015-07-08 NOTE — Clinical Social Work Note (Signed)
Clinical Social Work Assessment  Patient Details  Name: Jesus [name] MRN: [id number] Date of Birth: 17-Nov-1950  Date of referral:  07/08/15               Reason for consult:  Discharge Planning                Permission sought to share information with:  Family Supports Permission granted to share information::     Name::     Jesus [name]/Jesus [name]  Agency::     Relationship::  ex-wife/son  Contact Information:     Housing/Transportation Living arrangements for the past 2 months:  Single Family Home Source of Information:  Other (Comment Required), Adult Children (ex-wife) Patient Interpreter Needed:  None Criminal Activity/Legal Involvement Pertinent to Current Situation/Hospitalization:  No - Comment as needed Significant Relationships:  Adult Children Lives with:  Self Do you feel safe going back to the place where you live?  No Need for family participation in patient care:  Yes (Comment)  Care giving concerns:  Pt will require placement at d/c.    Social Worker assessment / plan:  CSW met with pt at bedside. Pt gave permission for CSW to speak with ex-wife, Jesus [name] in room. Jesus [name] reports that pt went to stay with son in [place] after d/c last week from hospital, but was told to return here by oncology for further treatment. Pt's son Jesus [name] had expressed some difficulty in caring for pt at home and wanted to pursue SNF. Pt does not meet skilled criteria for therapy, but will require 7-10 days IV steroids at d/c. CSW spoke with case manager at [facility]) who reports this will qualify as skilled need. Pt having PICC line placed today. CSW explained to Jesus [name] and Jesus [name] that this will be very short term and they will need to be considering plan after SNF. Both report understanding. Jesus [name] requests [facility] in order to allow oncology to follow pt at facility.   Employment status:  Disabled (Comment on whether or not currently receiving Disability) Insurance information:  Managed  Medicare PT Recommendations:  Home with [place] / Referral to community resources:  [place]  Patient/Family's Response to care:  Pt's family requesting SNF when medically stable.   Patient/Family's Understanding of and Emotional Response to Diagnosis, Current Treatment, and Prognosis:  Pt's family appears to be knowledgeable of pt's medical history. Son feels pt is declining rapidly and believes that hospice will be needed in the near future. Brief support provided.   Emotional Assessment Appearance:  Appears stated age Attitude/Demeanor/Rapport:  Unable to Assess Affect (typically observed):  Unable to Assess Orientation:  Oriented to Self Alcohol / Substance use:  Not Applicable Psych involvement (Current and /or in the community):  No (Comment)  Discharge Needs  Concerns to be addressed:  Discharge Planning Concerns Readmission within the last 30 days:  Yes Current discharge risk:  Cognitively Impaired Barriers to Discharge:  Continued Medical Work up   [employer], [place] 07/08/2015, 3:53 PM [phone]

## 2015-07-08 NOTE — Evaluation (Signed)
Physical Therapy Evaluation Patient Details Name: Jesus [name] MRN: [id number] DOB: 04-Sep-1950 Today's Date: 07/08/2015   History of Present Illness  65 year old male who  has a past medical history of High cholesterol; Depression; Arthritis; Bladder cancer ([place]); and Metastatic urothelial carcinoma ([place]) (07/16/2014).  He has been admitted due to confusion following chemotherapy.  Clinical Impression  Pt was seen for evaluation.  Initially, he was very drowsy but was able to awaken with repeated stimuli.  He was able to converse to a small degree but I am not sure the accuracy of his hx.  He was oriented to self, place but not situation.  His strength is found to be normal but high level dynamic balance is mildly decreased.  He is able to ambulate functional distances with no assistive device but because of mild instability I feel that for awhile he is most stable using a walker.  This should improve as his activity level improves.    Follow Up Recommendations Home health PT (if desired by pt for work on balance)    Equipment Recommendations  Rolling walker with 5" wheels (p)    Recommendations for Other Services   none    Precautions / Restrictions Precautions Precautions: Fall Restrictions Weight Bearing Restrictions: No      Mobility  Bed Mobility Overal bed mobility: Independent                Transfers Overall transfer level: Independent Equipment used: None                Ambulation/Gait Ambulation/Gait assistance: Supervision Ambulation Distance (Feet): 200 Feet (200' with no AD, then 40' with cane, then 100' with walker) Assistive device: Rolling walker (2 wheeled);Straight cane;None Gait Pattern/deviations: WFL(Within Functional Limits);Decreased dorsiflexion - right;Decreased dorsiflexion - left   Gait velocity interpretation: >2.62 ft/sec, indicative of independent community ambulator General Gait Details: had one LOB during a turn while  ambulating with no AD...he was instructed in gait with a cane but still was mildly unsteady so was then instructed in gait with a walker...this maximized stability  Stairs            Wheelchair Mobility    Modified Rankin (Stroke Patients Only)       Balance Overall balance assessment: Needs assistance Sitting-balance support: No upper extremity supported;Feet supported Sitting balance-Leahy Scale: Normal     Standing balance support: No upper extremity supported Standing balance-Leahy Scale: Fair Standing balance comment: dynamic balance most likely due to recent periods of bedrest rather than neurologic involvement                             Pertinent Vitals/Pain Pain Assessment: No/denies pain    Home Living Family/patient expects to be discharged to:: Unsure Living Arrangements: Children Available Help at Discharge: Family Type of Home: House                Prior Function Level of Independence: Independent         Comments: per pt report     Hand Dominance        Extremity/Trunk Assessment   Upper Extremity Assessment: Overall WFL for tasks assessed           Lower Extremity Assessment: Overall WFL for tasks assessed      Cervical / Trunk Assessment: Normal  Communication   Communication: No difficulties  Cognition Arousal/Alertness: Awake/alert (initially very drowsy) Behavior During Therapy: WFL for tasks assessed/performed Overall  Cognitive Status: Within Functional Limits for tasks assessed                      General Comments      Exercises        Assessment/Plan    PT Assessment All further PT needs can be met in the next venue of care  PT Diagnosis     PT Problem List Decreased balance  PT Treatment Interventions     PT Goals (Current goals can be found in the Care Plan section) Acute Rehab PT Goals PT Goal Formulation: All assessment and education complete, DC therapy    Frequency      Barriers to discharge        Co-evaluation               End of Session Equipment Utilized During Treatment: Gait belt Activity Tolerance: Patient tolerated treatment well Patient left: in bed;with call bell/phone within reach;with nursing/sitter in room Nurse Communication: Mobility status         Time: 4799-8721 PT Time Calculation (min) (ACUTE ONLY): 34 min   Charges:   PT Evaluation $PT Eval Moderate Complexity: 1 Procedure PT Treatments $Gait Training: 8-22 mins   PT G CodesSable [name]  PT 07/08/2015, 10:42 AM [phone]

## 2015-07-08 NOTE — Progress Notes (Signed)
Patient is very agitated. Trying to come out of bed. Mid level had been notified. Waiting for order.

## 2015-07-08 NOTE — Progress Notes (Signed)
Daily Progress Note   Patient Name: Jesus [name]       Date: 07/08/2015 DOB: [birth year]  Age: 65 y.o. MRN#: [id number] Attending Physician: Annita Brod, MD Primary Care Physician: Wende Neighbors, MD Admit Date: 07/04/2015  Reason for Consultation/Follow-up: Disposition, Establishing goals of care and Psychosocial/spiritual support  Subjective: Jesus [name] is resting quietly in bed.  He does not open his eyes or respond when I call to him.  His former wife/[name]'s mother is at bedside.  She shares that she is willing to assist in his care.  We talk about the PT evaluation, and Jesus [name] NOT qualifying for a skilled need.  We talk about the options of ALF or staying with family as we agree that he is not able to stay on his own.  We discuss the likelihood that if he does not improve in the next 1-2 weeks, this may be his end of life and in patient hospice appropriate at that time.   Case discussed with Dr. Maryland Pink and CM, SW regarding need for PICC line and possible placement in SNF (dt need for IV) vs ALF.   Call to [facility], left VM regarding current plan of care and options.   Length of Stay: 3 days  Current Medications: Scheduled Meds:  . enoxaparin (LOVENOX) injection  40 mg Subcutaneous Q24H  . insulin aspart  0-9 Units Subcutaneous TID WC  . methylPREDNISolone (SOLU-MEDROL) injection  140 mg Intravenous Q24H    Continuous Infusions: . sodium chloride 50 mL/hr at 07/08/15 0834    PRN Meds: acetaminophen **OR** acetaminophen, ondansetron **OR** ondansetron (ZOFRAN) IV  Physical Exam: Physical Exam  Constitutional: No distress.  HENT:  Head: Normocephalic and atraumatic.  Pulmonary/Chest: Effort normal and breath sounds normal. No respiratory distress.  Abdominal: Soft.  There is no guarding.  Neurological:  lethargic  Skin: Skin is warm and dry.  Nursing note and vitals reviewed.               Vital Signs: BP 154/95 mmHg  Pulse 67  Temp(Src) 98.6 F (37 C) (Oral)  Resp 18  Ht 5\' 8"  (1.727 m)  Wt 72.303 kg (159 lb 6.4 oz)  BMI 24.24 kg/m2  SpO2 99% SpO2: SpO2: 99 % O2 Device: O2 Device: Not Delivered O2 Flow Rate:    Intake/output  summary:  Intake/Output Summary (Last 24 hours) at 07/08/15 1244 Last data filed at 07/08/15 0800  Gross per 24 hour  Intake    720 ml  Output      0 ml  Net    720 ml   LBM: Last BM Date: 07/05/15 Baseline Weight: Weight: 71.668 kg (158 lb) Most recent weight: Weight: 72.303 kg (159 lb 6.4 oz)       Palliative Assessment/Data: Flowsheet Rows        Most Recent Value   Intake Tab    Referral Department  Hospitalist   Unit at Time of Referral  Med/Surg Unit   Palliative Care Primary Diagnosis  Other (Comment)   Date Notified  07/07/15   Palliative Care Type  New Palliative care   Reason for referral  Clarify Goals of Care, Psychosocial or Spiritual support, Advance Care Planning   Date of Admission  07/04/15   Date first seen by Palliative Care  07/07/15   # of days Palliative referral response time  0 Day(s)   # of days IP prior to Palliative referral  3   Clinical Assessment    Palliative Performance Scale Score  40%   Pain Max last 24 hours  2   Pain Min Last 24 hours  0   Dyspnea Max Last 24 Hours  2   Dyspnea Min Last 24 hours  0   Psychosocial & Spiritual Assessment    Social Work Plan of Care  Staff support, Clarified patient/family wishes with healthcare team   Palliative Care Outcomes    Patient/Family meeting held?  Yes   Who was at the meeting?  Jesus [name] and his son Tajiddin Schielke.    Palliative Care Outcomes  Clarified goals of care, Counseled regarding hospice, Provided advance care planning   Patient/Family wishes: Interventions discontinued/not started   Mechanical Ventilation, PEG    Palliative Care follow-up planned  Yes, Facility   Other Treatment Preference Instructions  plan dc to SNF for rehab      Additional Data Reviewed: CBC    Component Value Date/Time   WBC 5.8 07/04/2015 2340   RBC 4.31 07/04/2015 2340   HGB 12.3* 07/04/2015 2340   HCT 37.3* 07/04/2015 2340   PLT 164 07/04/2015 2340   MCV 86.5 07/04/2015 2340   MCH 28.5 07/04/2015 2340   MCHC 33.0 07/04/2015 2340   RDW 13.2 07/04/2015 2340   LYMPHSABS 1.2 07/04/2015 2340   MONOABS 0.4 07/04/2015 2340   EOSABS 0.1 07/04/2015 2340   BASOSABS 0.0 07/04/2015 2340    CMP     Component Value Date/Time   NA 138 07/08/2015 0654   K 3.9 07/08/2015 0654   CL 101 07/08/2015 0654   CO2 27 07/08/2015 0654   GLUCOSE 152* 07/08/2015 0654   BUN 32* 07/08/2015 0654   CREATININE 1.65* 07/08/2015 0654   CALCIUM 8.9 07/08/2015 0654   PROT 6.6 07/04/2015 2340   ALBUMIN 3.4* 07/04/2015 2340   AST 43* 07/04/2015 2340   ALT 35 07/04/2015 2340   ALKPHOS 82 07/04/2015 2340   BILITOT 1.0 07/04/2015 2340   GFRNONAA 43* 07/08/2015 0654   GFRAA 50* 07/08/2015 0654       Problem List:  Patient Active Problem List   Diagnosis Date Noted  . Palliative care encounter   . DNR (do not resuscitate) discussion   . Encephalopathy 07/05/2015  . Transient amnesia   . 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 Bizarre behavior 07/01/2015  . Altered mental  status   . CKD (chronic kidney disease), stage III 06/30/2015  . Depression with anxiety 06/30/2015  . Acute encephalopathy 06/29/2015  . Iron deficiency anemia due to chronic blood loss 08/02/2014  . Metastatic urothelial carcinoma ([place]) 07/16/2014     Palliative Care Assessment & Plan    1.Code Status:  DNR    Code Status Orders        Start     Ordered   07/07/15 1340  Do not attempt resuscitation (DNR)   Continuous    Question Answer Comment  In the event of cardiac or respiratory ARREST Do not call a "code blue"   In the event of cardiac or  respiratory ARREST Do not perform Intubation, CPR, defibrillation or ACLS   In the event of cardiac or respiratory ARREST Use medication by any route, position, wound care, and other measures to relive pain and suffering. May use oxygen, suction and manual treatment of airway obstruction as needed for comfort.      07/07/15 1339    Code Status History    Date Active Date Inactive Code Status Order ID Comments User Context   07/05/2015  1:34 AM 07/07/2015  1:39 PM Full Code [id number]  Oswald Hillock, MD Inpatient   06/29/2015  5:12 PM 07/02/2015  8:00 PM Full Code [id number]  Doree Albee, MD Inpatient   06/19/2015 12:32 PM 06/20/2015  4:19 PM Full Code [id number]  Irine Seal, MD Inpatient   08/21/2014  1:56 PM 08/22/2014  3:34 AM Full Code [id number]  Jacqulynn Cadet, MD HOV   07/16/2014  6:15 PM 07/19/2014  1:19 PM Full Code [id number]  Malka So, MD Inpatient    Advance Directive Documentation        Most Recent Value   Type of Advance Directive  Healthcare Power of Attorney   Pre-existing out of facility DNR order (yellow form or pink MOST form)     "MOST" Form in Place?         2. Goals of Care/Additional Recommendations:  At this point, continue with high dose steroids, considering disposition.   Limitations on Scope of Treatment: None at this time.   Desire for further Chaplaincy support:no  Psycho-social Needs: None  3. Symptom Management:      1.per hospitalist   4. Palliative Prophylaxis:   Delirium Protocol  5. Prognosis: Unable to determine, based on outcomes of steroid therapy.   6. Discharge Planning:  To be determined, ALF vs SNF.    Care plan was discussed with CM, SW, and Dr. [place] Pink.   Thank you for allowing the Palliative Medicine Team to assist in the care of this patient.   Time In: 1015 Time Out: 1050 Total Time 35 minutes Prolonged Time Billed  no         Drue Novel, NP  07/08/2015, 12:44 PM  Please contact Palliative Medicine Team phone at  [phone] for questions and concerns.

## 2015-07-08 NOTE — Progress Notes (Signed)
PROGRESS NOTE  Jesus [name] [id number] DOB: 12-25-1950 DOA: 07/04/2015 PCP: Wende Neighbors, MD  HPI/Recap of past 26 hours: 65 year old male with past oral history of depression, hyperlipidemia and  metastatic urothelial carcinoma who had been started on immunotherapy have been suffering in the last few weeks with transient global amnesia and somnolence. Patient was seen by neurology and extensive dementia workup unrevealing as well as psychiatric workup. After discussion between patient's son and his oncologist, there is concern that immunotherapy could be causing his symptoms. It was felt best that patient come in for IV steroids.  If this treatment did not work, hospice options would be looked at. patient started on IV Solu-Medrol high-dose and brought in under hospitalist service  After several days, patient doing about the same. He did have an event on morning of 1/16 where he was found out of bed and slid to the floor. Did not look like he had fallen. Hospice and oncology have followed with patient.  Patient does not appear to be getting much better. Plan will be to place PICC line, try to attempt to place in skilled nursing with continuation of high-dose IV steroids for the next 7-10 days. If he does not have any further improvement from this, change to hospice.  Patient himself no complaints today. More withdrawn.  Assessment/Plan: Active Problems: Stage III chronic kidney disease: At baseline History of urothelial cancer, being followed by oncology and hospice. Continue IV Solu-Medrol    Encephalopathy/transient global amnesia: Possibly secondary to above. May also be progression of disease   Code Status:Full code   Family Communication: Ex-wife at the bedside  Disposition Plan:continued IV steroids for 7-10 more days, we'll attempt to place in skilled nursing at [facility] short-term    Consultants:  Oncology  Palliative care  Procedures:  None   Antibiotics:  None   Objective: BP 154/95 mmHg  Pulse 67  Temp(Src) 98.6 F (37 C) (Oral)  Resp 18  Ht 5\' 8"  (1.727 m)  Wt 72.303 kg (159 lb 6.4 oz)  BMI 24.24 kg/m2  SpO2 99%  Intake/Output Summary (Last 24 hours) at 07/08/15 1406 Last data filed at 07/08/15 0800  Gross per 24 hour  Intake    480 ml  Output      0 ml  Net    480 ml   Filed Weights   07/04/15 2111 07/05/15 0135  Weight: 71.668 kg (158 lb) 72.303 kg (159 lb 6.4 oz)    Exam:   General:  More somnolent and confused today  Cardiovascular: Regular rate and rhythm, S1-S2    Respiratory: Clear to auscultation bilaterally    Abdomen: Soft, nontender, nondistended, positive bowel sounds    Musculoskeletal: Trace pitting edema bilaterally     Data Reviewed: Basic Metabolic Panel:  Recent Labs Lab 07/02/15 0642 07/04/15 2340 07/06/15 0639 07/08/15 0654  NA 138 134* 137 138  K 3.6 3.8 3.8 3.9  CL 104 98* 101 101  CO2 27 26 26 27   GLUCOSE 110* 110* 153* 152*  BUN 16 24* 26* 32*  CREATININE 1.67* 1.87* 1.71* 1.65*  CALCIUM 8.3* 8.7* 8.7* 8.9   Liver Function Tests:  Recent Labs Lab 07/04/15 2340  AST 43*  ALT 35  ALKPHOS 82  BILITOT 1.0  PROT 6.6  ALBUMIN 3.4*   No results for input(s): LIPASE, AMYLASE in the last 168 hours. No results for input(s): AMMONIA in the last 168 hours. CBC:  Recent Labs Lab 07/04/15 2340  WBC 5.8  NEUTROABS  4.1  HGB 12.3*  HCT 37.3*  MCV 86.5  PLT 164   Cardiac Enzymes:   No results for input(s): CKTOTAL, CKMB, CKMBINDEX, TROPONINI in the last 168 hours. BNP (last 3 results) No results for input(s): BNP in the last 8760 hours.  ProBNP (last 3 results) No results for input(s): PROBNP in the last 8760 hours.  CBG:  Recent Labs Lab 07/07/15 1118 07/07/15 1625 07/07/15 2304 07/08/15 0728 07/08/15 1138  GLUCAP 143* 107* 97 134* 234*    No results found for this or any previous visit (from the past 240 hour(s)).   Studies: No results  found.  Scheduled Meds: . enoxaparin (LOVENOX) injection  40 mg Subcutaneous Q24H  . insulin aspart  0-9 Units Subcutaneous TID WC  . methylPREDNISolone (SOLU-MEDROL) injection  140 mg Intravenous Q24H    Continuous Infusions: . sodium chloride 50 mL/hr at 07/08/15 0834     Time spent: 15 minutes   [facility] Pager [phone] . If 7PM-7AM, please contact night-coverage at [email or web address], password [facility] 07/08/2015, 2:06 PM  LOS: 3 days

## 2015-07-08 NOTE — NC FL2 (Signed)
Jesus [name] LEVEL OF CARE SCREENING TOOL     IDENTIFICATION  Patient Name: Jesus [name] Birthdate: January 31, 1951 Sex: male Admission Date (Current Location): 07/04/2015  [facility] and [place] Number:  [employer] and Address:  [facility] [address] [place]      Provider Number: [id number]  Attending Physician Name and Address:  Annita Brod, MD  Relative Name and Phone Number:       Current Level of Care: Hospital Recommended Level of Care: [place] Prior Approval Number:    Date Approved/Denied:   PASRR Number:    Discharge Plan: SNF    Current Diagnoses: Patient Active Problem List   Diagnosis Date Noted  . Urothelial cancer ([place])   . Palliative care encounter   . DNR (do not resuscitate) discussion   . Encephalopathy 07/05/2015  . Transient amnesia   . 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 Bizarre behavior 07/01/2015  . Altered mental status   . CKD (chronic kidney disease), stage III 06/30/2015  . Depression with anxiety 06/30/2015  . Acute encephalopathy 06/29/2015  . Iron deficiency anemia due to chronic blood loss 08/02/2014  . Metastatic urothelial carcinoma ([place]) 07/16/2014    Orientation RESPIRATION BLADDER Height & Weight   Self    Normal Incontinent 5\' 8"  (172.7 cm) 159 lbs.  BEHAVIORAL SYMPTOMS/MOOD NEUROLOGICAL BOWEL NUTRITION STATUS  Other (Comment) (n/a)  (n/a) Incontinent Diet (Regular)  AMBULATORY STATUS COMMUNICATION OF NEEDS Skin   Limited Assist Verbally Normal                       Personal Care Assistance Level of Assistance    Bathing Assistance: Limited assistance Feeding assistance: Limited assistance Dressing Assistance: Limited assistance     Functional Limitations Info    Sight Info: Impaired Hearing Info: Adequate Speech Info: Adequate    SPECIAL CARE FACTORS FREQUENCY  PT (By licensed PT)     PT Frequency: 3               Contractures Contractures Info: Not present    Additional Factors Info  Insulin Sliding Scale Code Status Info: DNR Allergies Info: No known allergies   Insulin Sliding Scale Info: 3x daily       Current Medications (07/08/2015):  This is the current hospital active medication list Current Facility-Administered Medications  Medication Dose Route Frequency Provider Last Rate Last Dose  . 0.9 %  sodium chloride infusion   Intravenous Continuous Annita Brod, MD 50 mL/hr at 07/08/15 0834    . acetaminophen (TYLENOL) tablet 650 mg  650 mg Oral Q6H PRN Oswald Hillock, MD       Or  . acetaminophen (TYLENOL) suppository 650 mg  650 mg Rectal Q6H PRN Oswald Hillock, MD      . enoxaparin (LOVENOX) injection 40 mg  40 mg Subcutaneous Q24H Oswald Hillock, MD   40 mg at 07/08/15 0834  . insulin aspart (novoLOG) injection 0-9 Units  0-9 Units Subcutaneous TID WC Annita Brod, MD   3 Units at 07/08/15 1221  . methylPREDNISolone sodium succinate (SOLU-MEDROL) 140 mg in sodium chloride 0.9 % 50 mL IVPB  140 mg Intravenous Q24H Oswald Hillock, MD   140 mg at 07/07/15 2211  . ondansetron (ZOFRAN) tablet 4 mg  4 mg Oral Q6H PRN Oswald Hillock, MD       Or  . ondansetron (ZOFRAN) injection 4 mg  4 mg Intravenous Q6H PRN Oswald Hillock, MD         Discharge Medications: Please see discharge summary for a list of discharge medications.  Relevant Imaging Results:  Relevant Lab Results:   Additional Information Followed by [facility]. Will have PICC line for 7-10 days IV steroids.   [name], [place]

## 2015-07-08 NOTE — Clinical Social Work Placement (Signed)
   CLINICAL SOCIAL WORK PLACEMENT  NOTE  Date:  07/08/2015  Patient Details  Name: Jesus [name] MRN: [id number] Date of Birth: 06-24-1950  Clinical Social Work is seeking post-discharge placement for this patient at the [place] level of care (*CSW will initial, date and re-position this form in  chart as items are completed):  Yes   Patient/family provided with [facility] Work Department's list of facilities offering this level of care within the geographic area requested by the patient (or if unable, by the patient's family).  Yes   Patient/family informed of their freedom to choose among providers that offer the needed level of care, that participate in Medicare, Medicaid or managed care program needed by the patient, have an available bed and are willing to accept the patient.  Yes   Patient/family informed of [facility]'s ownership interest in [facility] and [facility], as well as of the fact that they are under no obligation to receive care at these facilities.  PASRR submitted to EDS on 07/08/15     PASRR number received on       Existing PASRR number confirmed on       FL2 transmitted to all facilities in geographic area requested by pt/family on 07/08/15     FL2 transmitted to all facilities within larger geographic area on       Patient informed that his/her managed care company has contracts with or will negotiate with certain facilities, including the following:            Patient/family informed of bed offers received.  Patient chooses bed at       Physician recommends and patient chooses bed at      Patient to be transferred to   on  .  Patient to be transferred to facility by       Patient family notified on   of transfer.  Name of family member notified:        PHYSICIAN       Additional Comment:    _______________________________________________ Salome Arnt, LCSW 07/08/2015, 3:43  PM [phone]

## 2015-07-09 DIAGNOSIS — Z452 Encounter for adjustment and management of vascular access device: Secondary | ICD-10-CM | POA: Insufficient documentation

## 2015-07-09 DIAGNOSIS — C689 Malignant neoplasm of urinary organ, unspecified: Secondary | ICD-10-CM

## 2015-07-09 LAB — GLUCOSE, CAPILLARY
GLUCOSE-CAPILLARY: 132 mg/dL — AB (ref 65–99)
Glucose-Capillary: 222 mg/dL — ABNORMAL HIGH (ref 65–99)
Glucose-Capillary: 93 mg/dL (ref 65–99)
Glucose-Capillary: 113 mg/dL — ABNORMAL HIGH (ref 65–99)

## 2015-07-09 NOTE — Progress Notes (Signed)
Daily Progress Note   Patient Name: Jesus [name]       Date: 07/09/2015 DOB: 1950-12-13  Age: 65 y.o. MRN#: [id number] Attending Physician: Samuella Cota, MD Primary Care Physician: Wende Neighbors, MD Admit Date: 07/04/2015  Reason for Consultation/Follow-up: Disposition, Establishing goals of care and Psychosocial/spiritual support  Subjective: Jesus [name] is sitting up in bed eating his lunch as I enter.  His former sister in law (Katherine's sister) Stacie Acres is at bedside.  He ok's me to talk with her about his health.  She shares that Jesus [name] has been awake, but has been moving around a lot. He denies pain or discomfort to her also. We talk about his disposition to the SNF for high dose steroid treatments. We talk about him continuing with oncology visits to determine future treatment course.  When we talk about cancer treatments or future Jesus [name] will close his eyes and seem to 'disconnect'.   Length of Stay: 4 days  Current Medications: Scheduled Meds:  . enoxaparin (LOVENOX) injection  40 mg Subcutaneous Q24H  . insulin aspart  0-9 Units Subcutaneous TID WC  . methylPREDNISolone (SOLU-MEDROL) injection  140 mg Intravenous Q24H    Continuous Infusions: . sodium chloride 50 mL/hr at 07/08/15 0834    PRN Meds: acetaminophen **OR** acetaminophen, ondansetron **OR** ondansetron (ZOFRAN) IV  Physical Exam: Physical Exam  Constitutional: He appears well-developed and well-nourished. No distress.  Cardiovascular: Normal rate and regular rhythm.   Pulmonary/Chest: Effort normal and breath sounds normal. No respiratory distress.  Abdominal: Soft. There is no tenderness.  Neurological: He is alert.  Periods of time motionless with eyes closed.   Skin: Skin is warm  and dry.  Nursing note and vitals reviewed.               Vital Signs: BP 160/93 mmHg  Pulse 70  Temp(Src) 98 F (36.7 C) (Oral)  Resp 18  Ht 5\' 8"  (1.727 m)  Wt 72.303 kg (159 lb 6.4 oz)  BMI 24.24 kg/m2  SpO2 96% SpO2: SpO2: 96 % O2 Device: O2 Device: Not Delivered O2 Flow Rate:    Intake/output summary:  Intake/Output Summary (Last 24 hours) at 07/09/15 1350 Last data filed at 07/09/15 0653  Gross per 24 hour  Intake      0 ml  Output    800 ml  Net   -800 ml   LBM: Last BM Date: 07/05/15 Baseline Weight: Weight: 71.668 kg (158 lb) Most recent weight: Weight: 72.303 kg (159 lb 6.4 oz)       Palliative Assessment/Data: Flowsheet Rows        Most Recent Value   Intake Tab    Referral Department  Hospitalist   Unit at Time of Referral  Med/Surg Unit   Palliative Care Primary Diagnosis  Other (Comment)   Date Notified  07/07/15   Palliative Care Type  New Palliative care   Reason for referral  Clarify Goals of Care, Psychosocial or Spiritual support, Advance Care Planning   Date of Admission  07/04/15   Date first seen by Palliative Care  07/07/15   # of days Palliative referral response time  0 Day(s)   # of days IP prior to Palliative referral  3   Clinical Assessment    Palliative Performance Scale Score  40%   Pain Max last 24 hours  2   Pain Min Last 24 hours  0   Dyspnea Max Last 24 Hours  2   Dyspnea Min Last 24 hours  0   Psychosocial & Spiritual Assessment    Social Work Plan of Care  Staff support, Clarified patient/family wishes with healthcare team   Palliative Care Outcomes    Patient/Family meeting held?  Yes   Who was at the meeting?  Jesus [name] and his son Jesus [name].    Palliative Care Outcomes  Clarified goals of care, Counseled regarding hospice, Provided advance care planning   Patient/Family wishes: Interventions discontinued/not started   Mechanical Ventilation, PEG   Palliative Care follow-up planned  Yes, Facility   Other Treatment  Preference Instructions  plan dc to SNF for rehab      Additional Data Reviewed: CBC    Component Value Date/Time   WBC 5.8 07/04/2015 2340   RBC 4.31 07/04/2015 2340   HGB 12.3* 07/04/2015 2340   HCT 37.3* 07/04/2015 2340   PLT 164 07/04/2015 2340   MCV 86.5 07/04/2015 2340   MCH 28.5 07/04/2015 2340   MCHC 33.0 07/04/2015 2340   RDW 13.2 07/04/2015 2340   LYMPHSABS 1.2 07/04/2015 2340   MONOABS 0.4 07/04/2015 2340   EOSABS 0.1 07/04/2015 2340   BASOSABS 0.0 07/04/2015 2340    CMP     Component Value Date/Time   NA 138 07/08/2015 0654   K 3.9 07/08/2015 0654   CL 101 07/08/2015 0654   CO2 27 07/08/2015 0654   GLUCOSE 152* 07/08/2015 0654   BUN 32* 07/08/2015 0654   CREATININE 1.65* 07/08/2015 0654   CALCIUM 8.9 07/08/2015 0654   PROT 6.6 07/04/2015 2340   ALBUMIN 3.4* 07/04/2015 2340   AST 43* 07/04/2015 2340   ALT 35 07/04/2015 2340   ALKPHOS 82 07/04/2015 2340   BILITOT 1.0 07/04/2015 2340   GFRNONAA 43* 07/08/2015 0654   GFRAA 50* 07/08/2015 0654       Problem List:  Patient Active Problem List   Diagnosis Date Noted  . Urothelial cancer ([place])   . Palliative care encounter   . DNR (do not resuscitate) discussion   . Encephalopathy 07/05/2015  . Transient amnesia   . 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 Bizarre behavior 07/01/2015  . Altered mental status   . CKD (chronic kidney disease), stage III 06/30/2015  . Depression with anxiety 06/30/2015  . Acute encephalopathy 06/29/2015  . Iron deficiency  anemia due to chronic blood loss 08/02/2014  . Metastatic urothelial carcinoma ([place]) 07/16/2014     Palliative Care Assessment & Plan    1.Code Status:  DNR    Code Status Orders        Start     Ordered   07/07/15 1340  Do not attempt resuscitation (DNR)   Continuous    Question Answer Comment  In the event of cardiac or respiratory ARREST Do not call a "code blue"   In the event of cardiac or respiratory ARREST Do not perform  Intubation, CPR, defibrillation or ACLS   In the event of cardiac or respiratory ARREST Use medication by any route, position, wound care, and other measures to relive pain and suffering. May use oxygen, suction and manual treatment of airway obstruction as needed for comfort.      07/07/15 1339    Code Status History    Date Active Date Inactive Code Status Order ID Comments User Context   07/05/2015  1:34 AM 07/07/2015  1:39 PM Full Code [id number]  Oswald Hillock, MD Inpatient   06/29/2015  5:12 PM 07/02/2015  8:00 PM Full Code [id number]  Doree Albee, MD Inpatient   06/19/2015 12:32 PM 06/20/2015  4:19 PM Full Code [id number]  Irine Seal, MD Inpatient   08/21/2014  1:56 PM 08/22/2014  3:34 AM Full Code [id number]  Jacqulynn Cadet, MD HOV   07/16/2014  6:15 PM 07/19/2014  1:19 PM Full Code [id number]  Malka So, MD Inpatient    Advance Directive Documentation        Most Recent Value   Type of Advance Directive  Healthcare Power of Attorney   Pre-existing out of facility DNR order (yellow form or pink MOST form)     "MOST" Form in Place?         2. Goals of Care/Additional Recommendations:  SNF for administration of IV steroids, disposition afterwards to be determined.   Limitations on Scope of Treatment: None at this time.   Desire for further Chaplaincy support:Not discussed today.   Psycho-social Needs: Education on Hospice  3. Symptom Management:      1.per hospitalist.   4. Palliative Prophylaxis:   Frequent Pain Assessment  5. Prognosis: Unable to determine, based on outcomes.   6. Discharge Planning:  [facility] for rehab with Palliative care service follow-up   Care plan was discussed with nursing staff, CM, SW, and Dr. Sarajane Jews on next rounds.   Thank you for allowing the Palliative Medicine Team to assist in the care of this patient.   Time In: 1155 Time Out: 1220 Total Time 25 minutes Prolonged Time Billed  no         Drue Novel, NP    07/09/2015, 1:50 PM  Please contact Palliative Medicine Team phone at [phone] for questions and concerns.

## 2015-07-09 NOTE — Progress Notes (Signed)
  PROGRESS NOTE  Jesus [name] [id number] DOB: 11-12-1950 DOA: 07/04/2015 PCP: Wende Neighbors, MD  Summary: 80 yom PMH metastatic urothelial carcinoma, currently on immunotherapy, presented with a few week hx of transient global amnesia and somnolence. Patient was seen by neurology and had an extensive dementia workup unrevealing as well as psychiatric workup. After discussion between patient's son and his oncologist, there is concern that immunotherapy could be causing his symptoms. He was started on high dose IV steroids  Assessment/Plan: 1. Acute encephalopathy, transient global ischemia. Thought to be related to immunotherapy. Continues on high-dose IV steroids. Currently alert and oriented. 2. CKD stage III, at baseline. 3. PMH urothelial CA, oncology and hospice following.    Overall appears stable  Code Status: DNR DVT prophylaxis: Lovenox Family Communication: None present Disposition Plan: Skilled nursing facility, likely 1/19  Murray Hodgkins, MD  [facility]  Pager [phone] If 7PM-7AM, please contact night-coverage at [email or web address], password [facility] 07/09/2015, 7:13 AM  LOS: 4 days   Consultants:  Oncology  Palliative care  Procedures:  none  Antibiotics:  none  HPI/Subjective: No complaints.  Objective: Filed Vitals:   07/08/15 0900 07/08/15 1400 07/08/15 2214 07/09/15 0650  BP:  139/91 152/81 160/93  Pulse:  82 64 70  Temp:  97.8 F (36.6 C) 98.6 F (37 C) 98 F (36.7 C)  TempSrc:  Oral Oral Oral  Resp:  18 18   Height:      Weight:      SpO2: 99% 96% 95% 97%    Intake/Output Summary (Last 24 hours) at 07/09/15 0713 Last data filed at 07/09/15 0653  Gross per 24 hour  Intake    480 ml  Output    800 ml  Net   -320 ml     Filed Weights   07/04/15 2111 07/05/15 0135  Weight: 71.668 kg (158 lb) 72.303 kg (159 lb 6.4 oz)    Exam:     Afebrile, not hypoxic  General:  Appears calm and comfortable Cardiovascular: RRR, no m/r/g. No  LE edema. Respiratory: CTA bilaterally, no w/r/r. Normal respiratory effort. Musculoskeletal: grossly normal tone BUE/BLE Psychiatric: grossly normal mood and affect, speech fluent and appropriate. Oriented to self, location, month, year. Neurologic: grossly non-focal.  New data reviewed:  Blood sugars stable  Pertinent data since admission:  Lactic acid 2.49  BUN 28, Creatinine 2.14    Scheduled Meds: . enoxaparin (LOVENOX) injection  40 mg Subcutaneous Q24H  . insulin aspart  0-9 Units Subcutaneous TID WC  . methylPREDNISolone (SOLU-MEDROL) injection  140 mg Intravenous Q24H   Continuous Infusions: . sodium chloride 50 mL/hr at 07/08/15 [id number]    Active Problems:   CKD (chronic kidney disease), stage III   Encephalopathy   Transient amnesia   Palliative care encounter   DNR (do not resuscitate) discussion   Urothelial cancer ([place])   Time spent 20 minutes    By signing my name below, I, Rosalie Doctor attest that this documentation has been prepared under the direction and in the presence of Murray Hodgkins, MD Electronically signed: Rosalie Doctor, Scribe.  07/09/2015  I personally performed the services described in this documentation. All medical record entries made by the scribe were at my direction. I have reviewed the chart and agree that the record reflects my personal performance and is accurate and complete. Murray Hodgkins, MD

## 2015-07-09 NOTE — Clinical Social Work Placement (Signed)
   CLINICAL SOCIAL WORK PLACEMENT  NOTE  Date:  07/09/2015  Patient Details  Name: Jesus [name] MRN: [id number] Date of Birth: 06-16-1951  Clinical Social Work is seeking post-discharge placement for this patient at the [place] level of care (*CSW will initial, date and re-position this form in  chart as items are completed):  Yes   Patient/family provided with [facility] Work Department's list of facilities offering this level of care within the geographic area requested by the patient (or if unable, by the patient's family).  Yes   Patient/family informed of their freedom to choose among providers that offer the needed level of care, that participate in Medicare, Medicaid or managed care program needed by the patient, have an available bed and are willing to accept the patient.  Yes   Patient/family informed of [facility]'s ownership interest in [facility] and [facility], as well as of the fact that they are under no obligation to receive care at these facilities.  PASRR submitted to EDS on 07/08/15     PASRR number received on 07/09/15     Existing PASRR number confirmed on       FL2 transmitted to all facilities in geographic area requested by pt/family on 07/08/15     FL2 transmitted to all facilities within larger geographic area on       Patient informed that his/her managed care company has contracts with or will negotiate with certain facilities, including the following:        Yes   Patient/family informed of bed offers received.  Patient chooses bed at [facility]     Physician recommends and patient chooses bed at      Patient to be transferred to [facility] on  .  Patient to be transferred to facility by       Patient family notified on   of transfer.  Name of family member notified:        PHYSICIAN       Additional Comment:  [facility] notified of plan to transfer to [facility] when stable.    _______________________________________________ Salome Arnt, LCSW 07/09/2015, 2:07 PM [phone]

## 2015-07-10 ENCOUNTER — Inpatient Hospital Stay
Admission: RE | Admit: 2015-07-10 | Discharge: 2015-07-15 | Disposition: A | Discharge status: Still In Hospital | Payer: Commercial Managed Care - HMO | Source: Ambulatory Visit | Attending: Internal Medicine | Admitting: Internal Medicine

## 2015-07-10 LAB — GLUCOSE, CAPILLARY
GLUCOSE-CAPILLARY: 146 mg/dL — AB (ref 65–99)
Glucose-Capillary: 125 mg/dL — ABNORMAL HIGH (ref 65–99)

## 2015-07-10 MED ORDER — SODIUM CHLORIDE 0.9 % IV SOLN: 140.0000 mg | INTRAVENOUS | Status: AC

## 2015-07-10 MED ORDER — HEPARIN SOD (PORK) LOCK FLUSH 100 UNIT/ML IV SOLN
500.0000 [IU] | INTRAVENOUS | Status: DC | PRN
  Filled 2015-07-10: qty 5

## 2015-07-10 NOTE — Progress Notes (Signed)
PAC in right chest flushed with normal saline 10 ml, brisk blood returned, flushed with 500 units Heparin and deaccessed, covered with bandaid.  Patient tolerated well.  Double lumen PICC in left upper arm flushed with 54ml saline, brisk blood returned.  DDI for discharge to [facility].

## 2015-07-10 NOTE — Discharge Summary (Signed)
Physician Discharge Summary  Jesus [name] [id number] DOB: 1951-01-23 DOA: 07/04/2015  PCP: Wende Neighbors, MD  Admit date: 07/04/2015 Discharge date: 07/10/2015  Recommendations for Outpatient Follow-up:  1. Discharge to SNF for continued high-dose IV steroid therapy through 07/23/15.  2. Followup with [facility] in 2 weeks to help guide steroid taper of corticosteroids over 4 weeks if not better and for re-evaluation    Follow-up Information    Follow up with Wende Neighbors, MD.   Specialty:  Internal Medicine   Why:  As needed   Contact information:   [place] [address] [phone]       Follow up with KEFALAS,THOMAS, PA-C.   Specialty:  Oncology   Why:  office will arrange appointment   Contact information:   [place] [address] [phone]       Discharge Diagnoses:  1. Acute encephalopathy 2. CKD stage III 3. Stage IV Metastatic urothelial cancer.    Discharge Condition: Improved Disposition: SNF  Diet recommendation: Regular  Filed Weights   07/04/15 2111 07/05/15 0135  Weight: 71.668 kg (158 lb) 72.303 kg (159 lb 6.4 oz)    History of present illness:  65 yom PMH metastatic urothelial carcinoma, currently on immunotherapy, presented on a recent hospitalization with a few week hx of transient global amnesia and somnolence. Patient was seen by neurology and psychiatry on that hospitalization and had an extensive dementia workup which was unrevealing. He was discharged to the son's care, however the patient had recurrent altered mental status. After discussion between patient's son and his oncologist, there was concern that immunotherapy could be causing his symptoms. He was started on high dose IV steroids and admitted.  Hospital Course:  Patient was admitted and started on high-dose IV steroids. According to chart review these continue to have some periods of strange behavior or decreased mental status. Currently he is  awake and alert and oriented. Oncology is recommended total 14 days of IV steroids with reassessment. They have recommended transfer to a skilled nursing facility and have recommended hospice although the family is not ready for this. He had extensive investigation on last admission including MRI of the brain and laboratory work.  Individual issues as below:  1. Acute encephalopathy. Appears improved. Thought to be related to immunotherapy. Continue on high-dose IV steroids.  2. CKD stage III, stable, at baseline. 3. Stage IV Metastatic urothelial cancer, S/P 1 treatment with Atezolizumab on 06/24/2015   Overall appears stable. Discharge to SNF today.   IV steroids 14 days (through 2/1).  followup with [facility] in 2 weeks to help guide steroid taper over 4 weeks if not better and for re-evaluation. He is not a candidate for further systemic chemotherapy at this point per their assessment.  Consultants:  Oncology  Palliative care  Procedures:  none  Antibiotics:  None   Discharge Instructions   Current Discharge Medication List    START taking these medications   Details  methylPREDNISolone sodium succinate 140 mg in sodium chloride 0.9 % 50 mL Inject 140 mg into the vein daily. Last dose 2/1.      CONTINUE these medications which have NOT CHANGED   Details  FOLIC ACID PO Take 1 tablet by mouth daily.    ondansetron (ZOFRAN) 8 MG tablet Take 1 tablet (8 mg total) by mouth every 8 (eight) hours as needed for nausea or vomiting. Reported on 06/24/2015 Qty: 20 tablet, Refills: 0  STOP taking these medications     lidocaine-prilocaine (EMLA) cream      prochlorperazine (COMPAZINE) 10 MG tablet      predniSONE (DELTASONE) 20 MG tablet        No Known Allergies  The results of significant diagnostics from this hospitalization (including imaging, microbiology, ancillary and laboratory) are listed below for reference.    Significant Diagnostic  Studies: Mr Kizzie Fantasia Contrast  Jul 15, 2015  CLINICAL DATA:  65 year old male with bladder cancer recently starting immunotherapy presenting with altered mental status/global amnesia. Subsequent encounter. EXAM: MRI HEAD WITHOUT AND WITH CONTRAST TECHNIQUE: Multiplanar, multiecho pulse sequences of the brain and surrounding structures were obtained without and with intravenous contrast. CONTRAST:  9mL MULTIHANCE GADOBENATE DIMEGLUMINE 529 MG/ML IV SOLN COMPARISON:  06/29/2015 CT.  08/12/2014 MR. FINDINGS: Exam is motion degraded. No acute infarct or intracranial hemorrhage. Moderate to marked global atrophy most notable cerebellum without hydrocephalus. Mild nonspecific periventricular white matter changes. No intracranial mass or osseous destructive lesion to suggest presence of intracranial metastatic disease. Major intracranial vascular structures are patent. Cervical medullary junction, pituitary region, pineal region and orbital structures unremarkable. Mucosal thickening ethmoid sinus air cells. IMPRESSION: Exam is motion degraded. No acute infarct or intracranial hemorrhage. Moderate to marked global atrophy most notable cerebellum without hydrocephalus. Mild nonspecific periventricular white matter changes. No intracranial mass or osseous destructive lesion to suggest presence of intracranial metastatic disease. Electronically Signed   By: Genia Del M.D.   On: 2015-07-15 13:34    Labs: Basic Metabolic Panel:  Recent Labs Lab 07/04/15 2340 07/06/15 0639 07/08/15 0654  NA 134* 137 138  K 3.8 3.8 3.9  CL 98* 101 101  CO2 26 26 27   GLUCOSE 110* 153* 152*  BUN 24* 26* 32*  CREATININE 1.87* 1.71* 1.65*  CALCIUM 8.7* 8.7* 8.9   Liver Function Tests:  Recent Labs Lab 07/04/15 2340  AST 43*  ALT 35  ALKPHOS 82  BILITOT 1.0  PROT 6.6  ALBUMIN 3.4*   CBC:  Recent Labs Lab 07/04/15 2340  WBC 5.8  NEUTROABS 4.1  HGB 12.3*  HCT 37.3*  MCV 86.5  PLT 164   CBG:  Recent  Labs Lab 07/09/15 1153 07/09/15 1714 07/09/15 2108 07/10/15 0736 07/10/15 1154  GLUCAP 222* 93 113* 125* 146*    Principal Problem:   Acute encephalopathy Active Problems:   Metastatic urothelial carcinoma ([facility])   CKD (chronic kidney disease), stage III   Palliative care encounter   DNR (do not resuscitate) discussion   Urothelial cancer ([place])   PICC (peripherally inserted central catheter) in place   Time coordinating discharge: 35 minutes  Signed:  Murray Hodgkins, MD [facility] 07/10/2015, 7:03 AM   By signing my name below, I, Jesus [name] attest that this documentation has been prepared under the direction and in the presence of Murray Hodgkins, MD Electronically signed: Rosalie [name], Scribe.  07/10/2015  I personally performed the services described in this documentation. All medical record entries made by the scribe were at my direction. I have reviewed the chart and agree that the record reflects my personal performance and is accurate and complete. Murray Hodgkins, MD

## 2015-07-10 NOTE — Progress Notes (Signed)
PROGRESS NOTE  Jesus [name] [id number] DOB: [birth year] DOA: 07/04/2015 PCP: Wende Neighbors, MD  Summary: 86 yom PMH metastatic urothelial carcinoma, currently on immunotherapy, presented with a few week hx of transient global amnesia and somnolence. Patient was seen by neurology and had an extensive dementia workup unrevealing as well as psychiatric workup. After discussion between patient's son and his oncologist, there is concern that immunotherapy could be causing his symptoms. He was started on high dose IV steroids  Assessment/Plan: 1. Acute encephalopathy. Appears improved. Thought to be related to immunotherapy. Continue on high-dose IV steroids.   2. CKD stage III, stable, at baseline. 3. Stage IV Metastatic urothelial cancer, S/P 1 treatment with Atezolizumab on 06/24/2015   Overall appears stable. Discharge to SNF today.   IV steroids 14 days (through 2/1).  [facility]up with [facility] in 2 weeks to help guide steroid taper over 4 weeks if not better and for re-evaluation. He is not a candidate for further systemic chemotherapy at this point per their assessment.  Code Status: DNR DVT prophylaxis: Lovenox Family Communication: None present   Disposition Plan: Skilled nursing facility today.   Jesus Hodgkins, MD  [facility]  Pager [phone] If 7PM-7AM, please contact night-coverage at [email or web address], password [email or web address] 07/10/2015, 7:00 AM  LOS: 5 days   Consultants:  Oncology  Palliative care  Procedures:  none  Antibiotics:  none  HPI/Subjective: Feels good. Denies any pain, nausea, or vomiting. Has a good appetite.   Objective: Filed Vitals:   07/09/15 0650 07/09/15 0819 07/09/15 1438 07/09/15 2011  BP: 160/93  130/80 146/91  Pulse: 70  89 72  Temp: 98 F (36.7 C)  98.6 F (37 C) 97.7 F (36.5 C)  TempSrc: Oral   Oral  Resp:   18 19  Height:      Weight:      SpO2: 97% 96% 96% 95%    Intake/Output Summary (Last 24 hours) at 07/10/15  0700 Last data filed at 07/09/15 2102  Gross per 24 hour  Intake      0 ml  Output    600 ml  Net   -600 ml     Filed Weights   07/04/15 2111 07/05/15 0135  Weight: 71.668 kg (158 lb) 72.303 kg (159 lb 6.4 oz)    Exam:     Afebrile, not hypoxic  General:  Appears calm and comfortable Eyes: PERRL, normal lids, irises  ENT: grossly normal hearing, lips   Cardiovascular: RRR, no m/r/g. No LE edema. Respiratory: CTA bilaterally, no w/r/r. Normal respiratory effort. Musculoskeletal: grossly normal tone BUE/BLE, moves all extremities, no focal deficits noted Psychiatric: grossly normal mood and affect, speech fluent and appropriate Neurologic: grossly non-focal. Oriented to self, location, month, and year   New data reviewed:  CBG stable.    Scheduled Meds: . enoxaparin (LOVENOX) injection  40 mg Subcutaneous Q24H  . insulin aspart  0-9 Units Subcutaneous TID WC  . methylPREDNISolone (SOLU-MEDROL) injection  140 mg Intravenous Q24H   Continuous Infusions:    Principal Problem:   Acute encephalopathy Active Problems:   Metastatic urothelial carcinoma (HCC)   CKD (chronic kidney disease), stage III   Palliative care encounter   DNR (do not resuscitate) discussion   Urothelial cancer ([place])   PICC (peripherally inserted central catheter) in place    By signing my name below, I, Rosalie Doctor attest that this documentation has been prepared under the direction and in the presence of Jesus Hodgkins, MD Electronically signed: Anderson Malta  Gregorio, Education administrator.  07/10/2015 12:32pm  I personally performed the services described in this documentation. All medical record entries made by the scribe were at my direction. I have reviewed the chart and agree that the record reflects my personal performance and is accurate and complete. Jesus Hodgkins, MD

## 2015-07-10 NOTE — Clinical Social Work Note (Signed)
CSW spoke with Sharyn Lull at [facility] and advised that patient was being discharged today and that patient would going to [facility]. Sharyn Lull provided patient's auth number as [id number].  CSW spoke with Tami at [facility] and advised that patient was being discharged today.  CSW provided Tami with patient's auth number.   CSW advised spoke with patient's son, Izzac Moulin, and advised that patient was being discharged today and would be transported by [facility] hospital staff via [facility] staff and would be in room 132 at [facility].  CSW sent clinicals via [employer].  CSW signing off.   Ihor Gully, [place] [phone]

## 2015-07-10 NOTE — Progress Notes (Signed)
Assisted patient to wheelchair, accompanied tech to [facility], report given to nurse caring for patient, questions answered, tech assisted patient to room 132 and helped to bed with call light within reach.

## 2015-07-10 NOTE — Care Management Important Message (Signed)
Important Message  Patient Details  Name: Jesus [name] MRN: [id number] Date of Birth: 1950/12/27   Medicare Important Message Given:  Yes    Sherald Barge, RN 07/10/2015, 11:14 AM

## 2015-07-10 NOTE — Care Management Note (Signed)
Case Management Note  Patient Details  Name: Jesus [name] MRN: [id number] Date of Birth: 01-Nov-1950  Subjective/Objective:                  Pt is from home with his son. Pt will be discharging to SNF for IV steroids. CSW has arranges for placement.   Action/Plan: No CM needs.   Expected Discharge Date:    07/10/2015              Expected Discharge Plan:  Skilled Nursing Facility  In-House Referral:  Clinical Social Work  Discharge planning Services  CM Consult  Post Acute Care Choice:  NA Choice offered to:  NA  DME Arranged:    DME Agency:     HH Arranged:    [place] Agency:     Status of Service:  Completed, signed off  Medicare Important Message Given:  Yes Date Medicare IM Given:    Medicare IM give by:    Date Additional Medicare IM Given:    Additional Medicare Important Message give by:     If discussed at [place] of Stay Meetings, dates discussed:  07/10/2015  Additional Comments:  Sherald Barge, RN 07/10/2015, 11:15 AM

## 2015-07-10 NOTE — Consult Note (Signed)
   [facility] CM Inpatient Consult   07/10/2015  HEROLD SLATES October 08, 1950 [id number]  Attempted to meet with family in patient room, none present, only sitter. Patient sleeping. RNCM left [facility] card with contact, sitter states she will give to family. Of note, [facility] Care Management services does not replace or interfere with any services that are arranged by inpatient case management or social work.  For additional questions or referrals please contact: Royetta Crochet. Laymond Purser, RN, BSN, [facility] Liaison [phone]

## 2015-07-10 NOTE — Progress Notes (Signed)
Called Oncology, patient not scheduled for next chemo treatment until February, will deaccessed PAC prior to discharge.

## 2015-07-13 ENCOUNTER — Non-Acute Institutional Stay (SKILLED_NURSING_FACILITY): Payer: Commercial Managed Care - HMO | Admitting: Internal Medicine

## 2015-07-13 DIAGNOSIS — R260 Ataxic gait: Secondary | ICD-10-CM

## 2015-07-13 DIAGNOSIS — F19921 Other psychoactive substance use, unspecified with intoxication with delirium: Secondary | ICD-10-CM | POA: Diagnosis not present

## 2015-07-13 DIAGNOSIS — N183 Chronic kidney disease, stage 3 unspecified: Secondary | ICD-10-CM

## 2015-07-13 DIAGNOSIS — C689 Malignant neoplasm of urinary organ, unspecified: Secondary | ICD-10-CM | POA: Diagnosis not present

## 2015-07-13 DIAGNOSIS — R410 Disorientation, unspecified: Secondary | ICD-10-CM

## 2015-07-13 NOTE — Progress Notes (Signed)
Patient ID: Jesus [name], male   DOB: Aug 20, 1950, 65 y.o.   MRN: [id number]       Facility; [facility] Chief complaint; admission to SNF post admit to [facility] from 1/13 to 1/19  History this is a patient with a history of metastatic urothelial carcinoma. He was started on immunotherapy with Atezolizumab on January 3. He has had at least 2 tumor resections by cystoscopy in 2016. He apparently presented with a subacute over weeks history of transient global amnesiac somnolence. He had an extensive neurologic review and hospital including an MRI of his brain that did not show metastatic disease, also had an EEG. It was felt that his immunotherapy could be causing his symptoms. He was started on high-dose IV steroids at 140 mg IV daily. He seemed to make significant improvement and the dose of IV steroids is to be continued by oncology.  Past Medical History  Diagnosis Date  . High cholesterol   . Depression     hx of   . Arthritis   . Bladder cancer ([place])   . Metastatic urothelial carcinoma ([place]) 07/16/2014    Past Surgical History  Procedure Laterality Date  . Knee surgery    . Tonsillectomy    . Transurethral resection of bladder tumor with gyrus (turbt-gyrus) N/A 07/16/2014    Procedure: TRANSURETHRAL RESECTION OF BLADDER TUMOR WITH GYRUS (TURBT-GYRUS);  Surgeon: Malka So, MD;  Location: [facility];  Service: Urology;  Laterality: N/A;  . Cystoscopy with biopsy N/A 07/16/2014    Procedure: CYSTOSCOPY WITH PROSTATE ULTRASOUND AND BIOPSY;  Surgeon: Malka So, MD;  Location: [facility];  Service: Urology;  Laterality: N/A;  . Transurethral resection of bladder tumor N/A 06/19/2015    Procedure: TRANSURETHRAL RESECTION OF BLADDER TUMOR (TURBT);  Surgeon: Irine Seal, MD;  Location: [facility];  Service: Urology;  Laterality: N/A;    Current Outpatient Prescriptions on File Prior to Visit  Medication Sig Dispense Refill  . FOLIC ACID PO Take 1 tablet by mouth daily.    . methylPREDNISolone  sodium succinate 140 mg in sodium chloride 0.9 % 50 mL Inject 140 mg into the vein daily. Last dose 2/1.    Marland Kitchen ondansetron (ZOFRAN) 8 MG tablet Take 1 tablet (8 mg total) by mouth every 8 (eight) hours as needed for nausea or vomiting. Reported on 06/24/2015 20 tablet 0   Social; patient lives in his [place]. I am not sure of his exact functional status, change in his functional status.  reports that he has never smoked. He has never used smokeless tobacco. He reports that he does not drink alcohol or use illicit drugs.   Family History  Problem Relation Age of Onset  . Cancer Mother   . Stroke Father   . Cancer Sister     Review of systems Gen. patient states he feels well HEENT no headache or diplopia Respiratory no shortness of breath Cardiac no chest pain GI no dysphagia Arlina Robes GEN no abdominal pain no nausea no vomiting GU no dysuria or hematuria Musculoskeletal does not complain of muscle bone pain Neurologic no complaints of focal weakness or numbness. Nursing reports his balance is unsteady Mental status; apparently he has made significant improvement on the high-dose steroids Skin; no rash  Physical examination Gen. patient does not look to be in any distress HEENT there is no oral lesions or thrush. Pupils equal and reactive. Lymph nonpalpable in the cervical clavicular axillary or inguinal areas Respiratory clear entry bilaterally Cardiac  heart sounds are normal he appears to be euvolemic Abdomen no liver no spleen no tenderness no masses GU no suprapubic fullness or tenderness no CVA tenderness. Skin; no rashes or pressure areas. Musculoskeletal; no active joints Neurologic; cranial nerves seem intact. Motor he has a left pronator drift. Reflexes are symmetric toes are downgoing question slight left Hoffman's reflex. Cerebellar finger to nose is done well. Gait; slightly wide-based and unsteady but all in all he is not too bad Mental status; given account of his  history able to answer some direct questions. At points in time he almost seems to pause in his responses this can last for about 5 or 10 seconds but then he seems to resume a reasonable conversation. There is no clear evidence of depression  Impression/plan #1 altered mental status felt to be related to his immunotherapy. Now on high-dose steroids with improvement. This is to be followed by oncology. His blood sugars are being checked and were 109 this morning. His mental status seems improved. He is listed as having transient global amnesia. MRI of the brain did not show an acute event or metastatic disease although it was motion degraded. 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 He appears to be stable from this currently. #3 chronic renal failure which appears to be stable.  Patient does not seem to have many other medical issues. I'm not really all that familiar with the immunotherapy agent he was on nor its side effects, nor the treatment here with high-dose IV steroids. I'll make sure he has oncology follow-up. He did not seem to meet clinical criteria for press syndrome.  CMP Latest Ref Rng 07/08/2015 07/06/2015 07/04/2015  Glucose 65 - 99 mg/dL 152(H) 153(H) 110(H)  BUN 6 - 20 mg/dL 32(H) 26(H) 24(H)  Creatinine 0.61 - 1.24 mg/dL 1.65(H) 1.71(H) 1.87(H)  Sodium 135 - 145 mmol/L 138 137 134(L)  Potassium 3.5 - 5.1 mmol/L 3.9 3.8 3.8  Chloride 101 - 111 mmol/L 101 101 98(L)  CO2 22 - 32 mmol/L 27 26 26   Calcium 8.9 - 10.3 mg/dL 8.9 8.7(L) 8.7(L)  Total Protein 6.5 - 8.1 g/dL - - 6.6  Total Bilirubin 0.3 - 1.2 mg/dL - - 1.0  Alkaline Phos 38 - 126 U/L - - 82  AST 15 - 41 U/L - - 43(H)  ALT 17 - 63 U/L - - 35    CBC Latest Ref Rng 07/04/2015 07/01/2015 06/30/2015  WBC 4.0 - 10.5 K/uL 5.8 8.5 8.0  Hemoglobin 13.0 - 17.0 g/dL 12.3(L) 14.7 13.6  Hematocrit 39.0 - 52.0 % 37.3(L) 43.9 41.1  Platelets 150 - 400 K/uL 164 181 174     #3 mild  gait ataxia. He should do well with physical therapy.

## 2015-07-15 ENCOUNTER — Encounter: Payer: Self-pay | Admitting: Internal Medicine

## 2015-07-15 ENCOUNTER — Encounter (HOSPITAL_COMMUNITY): Payer: Self-pay | Admitting: Emergency Medicine

## 2015-07-15 ENCOUNTER — Non-Acute Institutional Stay (SKILLED_NURSING_FACILITY): Payer: Commercial Managed Care - HMO | Admitting: Internal Medicine

## 2015-07-15 ENCOUNTER — Emergency Department (HOSPITAL_COMMUNITY)
Admission: EM | Admit: 2015-07-15 | Discharge: 2015-07-15 | Disposition: A | Discharge status: Home / Self Care | Payer: Commercial Managed Care - HMO | Attending: Emergency Medicine | Admitting: Emergency Medicine

## 2015-07-15 ENCOUNTER — Ambulatory Visit (HOSPITAL_COMMUNITY): Payer: Commercial Managed Care - HMO | Admitting: Hematology & Oncology

## 2015-07-15 ENCOUNTER — Inpatient Hospital Stay (HOSPITAL_COMMUNITY): Payer: Commercial Managed Care - HMO

## 2015-07-15 DIAGNOSIS — C7910 Secondary malignant neoplasm of unspecified urinary organs: Secondary | ICD-10-CM

## 2015-07-15 DIAGNOSIS — Z8739 Personal history of other diseases of the musculoskeletal system and connective tissue: Secondary | ICD-10-CM | POA: Insufficient documentation

## 2015-07-15 DIAGNOSIS — C679 Malignant neoplasm of bladder, unspecified: Secondary | ICD-10-CM | POA: Diagnosis not present

## 2015-07-15 DIAGNOSIS — Z7952 Long term (current) use of systemic steroids: Secondary | ICD-10-CM | POA: Insufficient documentation

## 2015-07-15 DIAGNOSIS — R251 Tremor, unspecified: Secondary | ICD-10-CM | POA: Diagnosis not present

## 2015-07-15 DIAGNOSIS — I10 Essential (primary) hypertension: Secondary | ICD-10-CM

## 2015-07-15 DIAGNOSIS — G9340 Encephalopathy, unspecified: Secondary | ICD-10-CM | POA: Diagnosis not present

## 2015-07-15 DIAGNOSIS — Z8659 Personal history of other mental and behavioral disorders: Secondary | ICD-10-CM | POA: Diagnosis not present

## 2015-07-15 DIAGNOSIS — Z8639 Personal history of other endocrine, nutritional and metabolic disease: Secondary | ICD-10-CM | POA: Insufficient documentation

## 2015-07-15 DIAGNOSIS — Z79899 Other long term (current) drug therapy: Secondary | ICD-10-CM | POA: Diagnosis not present

## 2015-07-15 DIAGNOSIS — R4182 Altered mental status, unspecified: Secondary | ICD-10-CM | POA: Insufficient documentation

## 2015-07-15 DIAGNOSIS — C689 Malignant neoplasm of urinary organ, unspecified: Secondary | ICD-10-CM

## 2015-07-15 NOTE — ED Notes (Signed)
Report called to [facility] center,  They will bring stretcher to transport pt

## 2015-07-15 NOTE — ED Provider Notes (Signed)
CSN: [id number]     Arrival date & time 07/15/15  1601 History   First MD Initiated Contact with Patient 07/15/15 1609     Chief Complaint  Patient presents with  . Altered Mental Status     (Consider location/radiation/quality/duration/timing/severity/associated sxs/prior Treatment) HPI 65 year old male who presents with altered mental status. History of metastatic urothelial cancer on immunotherapy. History is provided by EMS and on chart review. 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 Was recently discharged from the hospital after initiation of high-dose steroids for this, with mild improvement. Has had workup with MRI and EEG, that has been overall negative for seizures, metastatic disease, strokes or other acute processes. Patient was felt to be hypertensive by staff today, with blood pressures of 150/100, and he appeared to be more agitated than normal. They felt that he was leaning towards his left side. Was evaluated by the PA, and sent to the ED for evaluation.When family arrived, they state that this is his normal self.  Level 5 caveat due to AMS Past Medical History  Diagnosis Date  . High cholesterol   . Depression     hx of   . Arthritis   . Bladder cancer ([place])   . Metastatic urothelial carcinoma ([place]) 07/16/2014   Past Surgical History  Procedure Laterality Date  . Knee surgery    . Tonsillectomy    . Transurethral resection of bladder tumor with gyrus (turbt-gyrus) N/A 07/16/2014    Procedure: TRANSURETHRAL RESECTION OF BLADDER TUMOR WITH GYRUS (TURBT-GYRUS);  Surgeon: Malka So, MD;  Location: [facility];  Service: Urology;  Laterality: N/A;  . Cystoscopy with biopsy N/A 07/16/2014    Procedure: CYSTOSCOPY WITH PROSTATE ULTRASOUND AND BIOPSY;  Surgeon: Malka So, MD;  Location: [facility];  Service: Urology;  Laterality: N/A;  . Transurethral  resection of bladder tumor N/A 06/19/2015    Procedure: TRANSURETHRAL RESECTION OF BLADDER TUMOR (TURBT);  Surgeon: Irine Seal, MD;  Location: [facility];  Service: Urology;  Laterality: N/A;   Family History  Problem Relation Age of Onset  . Cancer Mother   . Stroke Father   . Cancer Sister    Social History  Substance Use Topics  . Smoking status: Never Smoker   . Smokeless tobacco: Never Used  . Alcohol Use: No    Review of Systems  Unable to perform ROS: Mental status change     Allergies  Review of patient's allergies indicates no known allergies.  Home Medications   Prior to Admission medications   Medication Sig Start Date End Date Taking? Authorizing Provider  folic acid (FOLVITE) 1 MG tablet Take 1 mg by mouth daily.   Yes Historical Provider, MD  methylPREDNISolone sodium succinate 140 mg in sodium chloride 0.9 % 50 mL Inject 140 mg into the vein daily. Last dose 2/1. 07/10/15  Yes Samuella Cota, MD  ondansetron (ZOFRAN) 8 MG tablet Take 1 tablet (8 mg total) by mouth every 8 (eight) hours as needed for nausea or vomiting. Reported on 06/24/2015 07/02/15  Yes Estela Leonie Green, MD   BP 153/100 mmHg  Pulse 96  Temp(Src) 97.8 F (36.6 C) (Axillary)  Resp 30  Wt 159 lb (72.122 kg)  SpO2 98% Physical Exam Physical Exam  Nursing note and vitals reviewed. Constitutional: non-toxic, seems mildly agitated initially but then subsequently sleeping Head: Normocephalic and atraumatic.  Mouth/Throat: Oropharynx is clear and moist.  Neck: Normal range of motion. Neck supple.  Cardiovascular: Normal rate and regular rhythm.   Pulmonary/Chest: Effort normal and breath sounds normal.  Abdominal: Soft. There is no tenderness. There is no rebound and no guarding.  Musculoskeletal: No deformities.  Neurological: Alert, no facial droop, moves all extremities symmetrically, answers yes and no to simple questions Skin: Skin is warm and dry.  Psychiatric: Cooperative  ED  Course  Procedures (including critical care time) Labs Review Labs Reviewed - No data to display  Imaging Review No results found. I have personally reviewed and evaluated these images and lab results as part of my medical decision-making.   EKG Interpretation None      MDM   Final diagnoses:  Urothelial cancer (HCC)  Altered mental status, unspecified altered mental status type   Discussed with Dr. Whitney Muse, who is patient's oncologist and states that she knows patient very well. After hearing patient presentation, feels that patient requires tapering of his steroids as an outpatient. Did not recommend any further workup as he is at his baseline now. According to family, they're in the process of transitioning him to hospice care. We'll discharge back to pain center with oncology follow-up in 1-2 days with Dr. Whitney Muse.    Forde Dandy, MD 07/16/15 [phone]

## 2015-07-15 NOTE — ED Notes (Signed)
Patient placed on continuous cardiac monitoring, continuous pulse 0x monitoring, seizure pads place on bed, foot of bed elevated.

## 2015-07-15 NOTE — Discharge Instructions (Signed)
Patient's altered mental status is due to his immunetherapy or possible high-dose steroids. We have spoken to Dr. Whitney Muse, who is the patient's oncologist. She did not request any further workup, and will follow-up with patient this week to transition steroids to oral and taper off.  Return without fail for worsening symptoms, including fever, vomiting and unable to keep down food/fluids, or any other symptoms concerning to you.

## 2015-07-15 NOTE — ED Notes (Signed)
Arrives EMS for [facility] center, states elevated BP, BP on arrival 139/97,  Pt baseline is altered, non-verbal,  Family recent in ED with pt to have admitted for Hospice.

## 2015-07-15 NOTE — Progress Notes (Signed)
Patient ID: Jesus [name], male   DOB: 18-Dec-1950, 65 y.o.   MRN: [id number]      Facility; [facility] Chief complaint; acute visit secondary to shaking trembling episodes hypertension  History this is a patient with a history of metastatic urothelial carcinoma. He was started on immunotherapy with Atezolizumab on January 3. He has had at least 2 tumor resections by cystoscopy in 2016. He apparently presented with a subacute over weeks history of transient global amnesiac somnolence. He had an extensive neurologic review and hospital including an MRI of his brain that did not show metastatic disease, also had an EEG. It was felt that his immunotherapy could be causing his symptoms. He was started on high-dose IV steroids at 140 mg IV daily.  He seemed to make significant improvement and the dose of IV steroids is to be continued by oncology.  Patient appears to have been relatively stable-however during therapy today apparently had increased shaking and trembling-his blood pressure is 150/110-speaking with  He is able to tell me his name and birthdate-he is making eye contact is responsive.   nursing staff and therapy this is an unusual presentation for patient has been a fairly sudden change Past Medical History  Diagnosis Date  . High cholesterol   . Depression     hx of   . Arthritis   . Bladder cancer ([place])   . Metastatic urothelial carcinoma ([place]) 07/16/2014    Past Surgical History  Procedure Laterality Date  . Knee surgery    . Tonsillectomy    . Transurethral resection of bladder tumor with gyrus (turbt-gyrus) N/A 07/16/2014    Procedure: TRANSURETHRAL RESECTION OF BLADDER TUMOR WITH GYRUS (TURBT-GYRUS);  Surgeon: Malka So, MD;  Location: [facility];  Service: Urology;  Laterality: N/A;  . Cystoscopy with biopsy N/A 07/16/2014    Procedure: CYSTOSCOPY WITH PROSTATE ULTRASOUND AND BIOPSY;  Surgeon: Malka So, MD;  Location: [facility];  Service: Urology;  Laterality: N/A;  .  Transurethral resection of bladder tumor N/A 06/19/2015    Procedure: TRANSURETHRAL RESECTION OF BLADDER TUMOR (TURBT);  Surgeon: Irine Seal, MD;  Location: [facility];  Service: Urology;  Laterality: N/A;    Current Outpatient Prescriptions on File Prior to Visit  Medication Sig Dispense Refill  . FOLIC ACID PO Take 1 tablet by mouth daily.    . methylPREDNISolone sodium succinate 140 mg in sodium chloride 0.9 % 50 mL Inject 140 mg into the vein daily. Last dose 2/1.    Marland Kitchen ondansetron (ZOFRAN) 8 MG tablet Take 1 tablet (8 mg total) by mouth every 8 (eight) hours as needed for nausea or vomiting. Reported on 06/24/2015 20 tablet 0   Social; patient lives in his [place]. I am not sure of his exact functional status, change in his functional status.  reports that he has never smoked. He has never used smokeless tobacco. He reports that he does not drink alcohol or use illicit drugs.   Family History  Problem Relation Age of Onset  . Cancer Mother   . Stroke-- Father   . Cancer Sister     Review of systems--quite limited since patient again appears to be in moderate distress Patient however is not really complaining of any pain or shortness of breath or headache.       Physical examination He is afebrile pulse of 114 respirations 18 blood pressure 150/110 O2 saturation is in the 90s on room air CBG is 143   In general this is  a somewhat frail appearing male who appears somewhat moderately uncomfortable again he has generalized shaking leaning somewhat to the left.  He is able to speak but fairly minimally.  His skin is warm and dry.  Eyes pupils appear reactive to light.  Oropharynx from what I can tell is clear I do not see that he has bit his tongue with any bleeding.  Chest is clear to auscultation with poor respiratory effort.  Heart is tachycardic.  Abdomen is soft does not appear to be acutely tender although this is somewhat difficult to tell with patient not speaking a  whole lot in his trembling.  Musculoskeletal he appears to be leaning to the left is moving his extremities again will somewhat of a shaking trembling appearance to all extremities.  He is able to grip my hand.  Neurologic again I could not really see any truly lateralizing findings cranial nerves appear to be intact he is able to speak tell me his name his birthdate make eye contact.  Psych-as noted above he appears to be alert and fairly oriented although somewhat distressed.  Labs. 07/06/2015  Sodium 137 potassium 3.8 BUN 26 creatinine 1.71.    07/04/2015  WBC 5.8 hemoglobin 12.3 platelets 164.  AST 43-albumin 3.4 otherwise liver function tests within normal limits  Impression/plan #1 altered mental status felt to be related to his immunotherapy. Now on high-dose steroids with improvement--however he appears to have had a change in status this afternoon-he is shaking trembling tachycardic blood pressure is elevated-per nursing staff-therapy-and family friend who stop by this appears to be a change in status-we will sent to the ER for emergent evaluation. 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  --Again this is followed closely by oncology as noted above  [id number]   .  CMP Latest Ref Rng 07/08/2015 07/06/2015 07/04/2015  Glucose 65 - 99 mg/dL 152(H) 153(H) 110(H)  BUN 6 - 20 mg/dL 32(H) 26(H) 24(H)  Creatinine 0.61 - 1.24 mg/dL 1.65(H) 1.71(H) 1.87(H)  Sodium 135 - 145 mmol/L 138 137 134(L)  Potassium 3.5 - 5.1 mmol/L 3.9 3.8 3.8  Chloride 101 - 111 mmol/L 101 101 98(L)  CO2 22 - 32 mmol/L 27 26 26   Calcium 8.9 - 10.3 mg/dL 8.9 8.7(L) 8.7(L)  Total Protein 6.5 - 8.1 g/dL - - 6.6  Total Bilirubin 0.3 - 1.2 mg/dL - - 1.0  Alkaline Phos 38 - 126 U/L - - 82  AST 15 - 41 U/L - - 43(H)  ALT 17 - 63 U/L - - 35    CBC Latest Ref Rng 07/04/2015 07/01/2015 06/30/2015  WBC 4.0 -  10.5 K/uL 5.8 8.5 8.0  Hemoglobin 13.0 - 17.0 g/dL 12.3(L) 14.7 13.6  Hematocrit 39.0 - 52.0 % 37.3(L) 43.9 41.1  Platelets 150 - 400 K/uL 164 181 174

## 2015-07-15 NOTE — ED Notes (Signed)
MD at the bedside to speak with family

## 2015-07-16 ENCOUNTER — Other Ambulatory Visit (HOSPITAL_COMMUNITY)
Admission: RE | Admit: 2015-07-16 | Discharge: 2015-07-16 | Disposition: A | Discharge status: Home / Self Care | Payer: Commercial Managed Care - HMO | Source: Skilled Nursing Facility | Attending: Internal Medicine | Admitting: Internal Medicine

## 2015-07-16 ENCOUNTER — Non-Acute Institutional Stay (SKILLED_NURSING_FACILITY): Payer: Commercial Managed Care - HMO | Admitting: Internal Medicine

## 2015-07-16 DIAGNOSIS — C7910 Secondary malignant neoplasm of unspecified urinary organs: Secondary | ICD-10-CM | POA: Diagnosis not present

## 2015-07-16 DIAGNOSIS — R401 Stupor: Secondary | ICD-10-CM | POA: Diagnosis not present

## 2015-07-16 DIAGNOSIS — G9340 Encephalopathy, unspecified: Secondary | ICD-10-CM

## 2015-07-16 LAB — CBC WITH DIFFERENTIAL/PLATELET
Basophils Absolute: 0 10*3/uL (ref 0.0–0.1)
Basophils Relative: 0 %
EOS ABS: 0 10*3/uL (ref 0.0–0.7)
Eosinophils Relative: 0 %
HCT: 44.8 % (ref 39.0–52.0)
Hemoglobin: 15.1 g/dL (ref 13.0–17.0)
LYMPHS ABS: 1.3 10*3/uL (ref 0.7–4.0)
Lymphocytes Relative: 10 %
MCH: 29 pg (ref 26.0–34.0)
MCHC: 33.7 g/dL (ref 30.0–36.0)
MCV: 86.2 fL (ref 78.0–100.0)
MONO ABS: 0.3 10*3/uL (ref 0.1–1.0)
Monocytes Relative: 2 %
Neutro Abs: 11.3 10*3/uL — ABNORMAL HIGH (ref 1.7–7.7)
Neutrophils Relative %: 88 %
PLATELETS: 143 10*3/uL — AB (ref 150–400)
RBC: 5.2 MIL/uL (ref 4.22–5.81)
RDW: 14.9 % (ref 11.5–15.5)
WBC: 12.9 10*3/uL — ABNORMAL HIGH (ref 4.0–10.5)

## 2015-07-16 LAB — SEDIMENTATION RATE: SED RATE: 9 mm/h (ref 0–16)

## 2015-07-16 LAB — COMPREHENSIVE METABOLIC PANEL
ALT: 347 U/L — AB (ref 17–63)
ANION GAP: 14 (ref 5–15)
AST: 232 U/L — ABNORMAL HIGH (ref 15–41)
Albumin: 3.2 g/dL — ABNORMAL LOW (ref 3.5–5.0)
Alkaline Phosphatase: 398 U/L — ABNORMAL HIGH (ref 38–126)
BUN: 50 mg/dL — ABNORMAL HIGH (ref 6–20)
CHLORIDE: 95 mmol/L — AB (ref 101–111)
CO2: 27 mmol/L (ref 22–32)
CREATININE: 2.09 mg/dL — AB (ref 0.61–1.24)
Calcium: 8.7 mg/dL — ABNORMAL LOW (ref 8.9–10.3)
GFR calc Af Amer: 37 mL/min — ABNORMAL LOW (ref >60)
GFR, EST NON AFRICAN AMERICAN: 32 mL/min — AB (ref >60)
Glucose, Bld: 173 mg/dL — ABNORMAL HIGH (ref 65–99)
Potassium: 4.9 mmol/L (ref 3.5–5.1)
Sodium: 136 mmol/L (ref 135–145)
Total Bilirubin: 3.8 mg/dL — ABNORMAL HIGH (ref 0.3–1.2)
Total Protein: 6.1 g/dL — ABNORMAL LOW (ref 6.5–8.1)

## 2015-07-16 NOTE — Progress Notes (Signed)
Patient ID: Jesus [name], male   DOB: June 24, 1950, 65 y.o.   MRN: [id number]       Facility; [facility] Chief complaint; acute visit secondary to continued altered mental status  History this is a patient with a history of metastatic urothelial carcinoma. He was started on immunotherapy with Atezolizumab on January 3. He has had at least 2 tumor resections by cystoscopy in 2016. He apparently presented with a subacute over weeks history of transient global amnesiac somnolence. He had an extensive neurologic review and hospital including an MRI of his brain that did not show metastatic disease, also had an EEG. It was felt that his immunotherapy could be causing his symptoms. He was started on high-dose IV steroids at 140 mg IV daily.  He seemed to make significant improvement and the dose of IV steroids is to be continued by oncology.  Patient appears to have been relatively stable- However the last couple days have been difficult-patient did present was increased trembling and leading to the left yesterday blood pressure was somewhat elevated we will sent to the ER-however apparently patient and return more to his baseline by the time he got to the ER-and he did come back to the facility.  However today patient is tossing and turning somewhat in bed moving side to side he does not appear to be in distress-he is somewhat constantly moving his arms and legs and somewhat of a slow manner.  He is making eye contact and attempts to speak somewhat but this has been a change.  Vital signs appear to be relatively stable.   Past Medical History  Diagnosis Date  . High cholesterol   . Depression     hx of   . Arthritis   . Bladder cancer ([place])   . Metastatic urothelial carcinoma ([place]) 07/16/2014    Past Surgical History  Procedure Laterality Date  . Knee surgery    . Tonsillectomy    . Transurethral resection of bladder tumor with gyrus (turbt-gyrus) N/A 07/16/2014    Procedure:  TRANSURETHRAL RESECTION OF BLADDER TUMOR WITH GYRUS (TURBT-GYRUS);  Surgeon: Malka So, MD;  Location: [facility];  Service: Urology;  Laterality: N/A;  . Cystoscopy with biopsy N/A 07/16/2014    Procedure: CYSTOSCOPY WITH PROSTATE ULTRASOUND AND BIOPSY;  Surgeon: Malka So, MD;  Location: [facility];  Service: Urology;  Laterality: N/A;  . Transurethral resection of bladder tumor N/A 06/19/2015    Procedure: TRANSURETHRAL RESECTION OF BLADDER TUMOR (TURBT);  Surgeon: Irine Seal, MD;  Location: [facility];  Service: Urology;  Laterality: N/A;    Current Outpatient Prescriptions on File Prior to Visit  Medication Sig Dispense Refill  . FOLIC ACID PO Take 1 tablet by mouth daily.    . methylPREDNISolone sodium succinate 140 mg in sodium chloride 0.9 % 50 mL Inject 140 mg into the vein daily. Last dose 2/1.    Marland Kitchen ondansetron (ZOFRAN) 8 MG tablet Take 1 tablet (8 mg total) by mouth every 8 (eight) hours as needed for nausea or vomiting. Reported on 06/24/2015 20 tablet 0   Social; patient lives in his [place]. I am not sure of his exact functional status, change in his functional status.  reports that he has never smoked. He has never used smokeless tobacco. He reports that he does not drink alcohol or use illicit drugs.   Family History  Problem Relation Age of Onset  . Cancer Mother   . Stroke-- Father   . Cancer Sister  Review of systems Essentially unattainable        Physical examination  He is afebrile pulse 80 respirations 18 blood pressure 140/98   Gen. this is a frail male who appears to be rolling from one side of bed to be other with movements of his arms and legs in a slow manner in somewhat constant motion  He is not really speaking today  His skin is warm and dry.  Eyes pupils appear reactive to light.  Oropharynx from what I can tell is clear   Chest is clear to auscultation with poor respiratory effort.  Heart is regular rate and rhythm.  Abdomen is soft  does not appear to be acutely tender there are positive bowel sounds  Musculoskeletal he does move all his extremities again in somewhat of a slow manner somewhat in a circular motion of his arms that is fairly constant  Neurologic again I could not really see any truly lateralizing findings cranial nerves appear to be intact -he is not really speaking or making eye contact  Psych-as noted above he appears to be having altered mental status  Labs. 07/06/2015  Sodium 137 potassium 3.8 BUN 26 creatinine 1.71.    07/04/2015  WBC 5.8 hemoglobin 12.3 platelets 164.  AST 43-albumin 3.4 otherwise liver function tests within normal limits  Impression/plan #1 altered mental status--patient appears to be declining again he did have issues yesterday as noted above-now he has somewhat of a different presentation rolling about in his bed with constant motion of his extremities not really making eye contact or speaking-patient was reassessed during the day and status appear to be relatively unchanged.  I did discuss his status with his son via phone-his son does not want aggressive measures-they are going to have a hospice consult apparently this is going to be implemented tomorrow when they see oncology. We did discuss future treatments and son does not wish any rehospitalization labs or IVs.  For comfort we will start Roxanol 5 mg sublingual every 4 hours for pain as needed.  Also for agitation and anxiety will start Haldol 5 mg by mouth or IM every 6 hours when necessary-this was discussed with Dr. Dellia Nims via phone  [phone] note greater than 40 minutes spent assessing patient-reassessing patient-discussing his status with nursing staff-as well as with his son via phone-and coordinating and formulating a plan of care-of note greater than 50% of time spent coordinating plan of care with family input.         #2 urothelial carcinoma of the bladder which is metastatic.     [id number]   .  CMP Latest Ref Rng 07/08/2015 07/06/2015 07/04/2015  Glucose 65 - 99 mg/dL 152(H) 153(H) 110(H)  BUN 6 - 20 mg/dL 32(H) 26(H) 24(H)  Creatinine 0.61 - 1.24 mg/dL 1.65(H) 1.71(H) 1.87(H)  Sodium 135 - 145 mmol/L 138 137 134(L)  Potassium 3.5 - 5.1 mmol/L 3.9 3.8 3.8  Chloride 101 - 111 mmol/L 101 101 98(L)  CO2 22 - 32 mmol/L 27 26 26   Calcium 8.9 - 10.3 mg/dL 8.9 8.7(L) 8.7(L)  Total Protein 6.5 - 8.1 g/dL - - 6.6  Total Bilirubin 0.3 - 1.2 mg/dL - - 1.0  Alkaline Phos 38 - 126 U/L - - 82  AST 15 - 41 U/L - - 43(H)  ALT 17 - 63 U/L - - 35    CBC Latest Ref Rng 07/04/2015 07/01/2015 06/30/2015  WBC 4.0 - 10.5 K/uL 5.8 8.5 8.0  Hemoglobin 13.0 - 17.0 g/dL 12.3(L)  14.7 13.6  Hematocrit 39.0 - 52.0 % 37.3(L) 43.9 41.1  Platelets 150 - 400 K/uL 164 181 174

## 2015-07-17 ENCOUNTER — Emergency Department (HOSPITAL_COMMUNITY): Payer: Commercial Managed Care - HMO

## 2015-07-17 ENCOUNTER — Other Ambulatory Visit: Payer: Self-pay

## 2015-07-17 ENCOUNTER — Ambulatory Visit (HOSPITAL_COMMUNITY): Payer: Self-pay | Admitting: Oncology

## 2015-07-17 ENCOUNTER — Emergency Department (HOSPITAL_COMMUNITY)
Admission: EM | Admit: 2015-07-17 | Discharge: 2015-07-17 | Disposition: A | Discharge status: Home / Self Care | Payer: Commercial Managed Care - HMO | Attending: Emergency Medicine | Admitting: Emergency Medicine

## 2015-07-17 DIAGNOSIS — R0989 Other specified symptoms and signs involving the circulatory and respiratory systems: Secondary | ICD-10-CM | POA: Insufficient documentation

## 2015-07-17 DIAGNOSIS — R0682 Tachypnea, not elsewhere classified: Secondary | ICD-10-CM | POA: Diagnosis not present

## 2015-07-17 DIAGNOSIS — Z515 Encounter for palliative care: Secondary | ICD-10-CM | POA: Diagnosis not present

## 2015-07-17 DIAGNOSIS — R000 Tachycardia, unspecified: Secondary | ICD-10-CM | POA: Insufficient documentation

## 2015-07-17 DIAGNOSIS — F329 Major depressive disorder, single episode, unspecified: Secondary | ICD-10-CM | POA: Diagnosis not present

## 2015-07-17 DIAGNOSIS — Z8739 Personal history of other diseases of the musculoskeletal system and connective tissue: Secondary | ICD-10-CM | POA: Diagnosis not present

## 2015-07-17 DIAGNOSIS — R569 Unspecified convulsions: Secondary | ICD-10-CM | POA: Insufficient documentation

## 2015-07-17 DIAGNOSIS — Z8639 Personal history of other endocrine, nutritional and metabolic disease: Secondary | ICD-10-CM | POA: Insufficient documentation

## 2015-07-17 DIAGNOSIS — Z8551 Personal history of malignant neoplasm of bladder: Secondary | ICD-10-CM | POA: Insufficient documentation

## 2015-07-17 DIAGNOSIS — R61 Generalized hyperhidrosis: Secondary | ICD-10-CM | POA: Diagnosis not present

## 2015-07-17 LAB — COMPREHENSIVE METABOLIC PANEL
ALBUMIN: 2.9 g/dL — AB (ref 3.5–5.0)
ALT: 341 U/L — AB (ref 17–63)
AST: 246 U/L — AB (ref 15–41)
Alkaline Phosphatase: 384 U/L — ABNORMAL HIGH (ref 38–126)
Anion gap: 12 (ref 5–15)
BUN: 50 mg/dL — AB (ref 6–20)
CHLORIDE: 102 mmol/L (ref 101–111)
CO2: 26 mmol/L (ref 22–32)
CREATININE: 2.11 mg/dL — AB (ref 0.61–1.24)
Calcium: 8.3 mg/dL — ABNORMAL LOW (ref 8.9–10.3)
GFR calc Af Amer: 37 mL/min — ABNORMAL LOW (ref >60)
GFR calc non Af Amer: 32 mL/min — ABNORMAL LOW (ref >60)
GLUCOSE: 104 mg/dL — AB (ref 65–99)
POTASSIUM: 4.3 mmol/L (ref 3.5–5.1)
Sodium: 140 mmol/L (ref 135–145)
Total Bilirubin: 5.1 mg/dL — ABNORMAL HIGH (ref 0.3–1.2)
Total Protein: 5.4 g/dL — ABNORMAL LOW (ref 6.5–8.1)

## 2015-07-17 LAB — CBC WITH DIFFERENTIAL/PLATELET
BASOS ABS: 0 10*3/uL (ref 0.0–0.1)
BASOS PCT: 0 %
EOS PCT: 0 %
Eosinophils Absolute: 0 10*3/uL (ref 0.0–0.7)
HEMATOCRIT: 41.7 % (ref 39.0–52.0)
Hemoglobin: 14 g/dL (ref 13.0–17.0)
LYMPHS PCT: 9 %
Lymphs Abs: 1.4 10*3/uL (ref 0.7–4.0)
MCH: 28.9 pg (ref 26.0–34.0)
MCHC: 33.6 g/dL (ref 30.0–36.0)
MCV: 86 fL (ref 78.0–100.0)
MONO ABS: 0.8 10*3/uL (ref 0.1–1.0)
Monocytes Relative: 5 %
NEUTROS ABS: 13.6 10*3/uL — AB (ref 1.7–7.7)
Neutrophils Relative %: 86 %
PLATELETS: 103 10*3/uL — AB (ref 150–400)
RBC: 4.85 MIL/uL (ref 4.22–5.81)
RDW: 15.1 % (ref 11.5–15.5)
WBC: 15.8 10*3/uL — AB (ref 4.0–10.5)

## 2015-07-17 MED ORDER — LORAZEPAM 2 MG/ML IJ SOLN
1.0000 mg | Freq: Once | INTRAMUSCULAR | Status: AC
  Administered 2015-07-17: 1 mg via INTRAVENOUS
  Filled 2015-07-17: qty 1

## 2015-07-17 MED ORDER — DIAZEPAM 2.5 MG RE GEL
2.5000 mg | Freq: Once | RECTAL | Status: AC
  Administered 2015-07-17: 2.5 mg via RECTAL

## 2015-07-17 MED ORDER — LORAZEPAM 2 MG/ML IJ SOLN
1.0000 mg | Freq: Once | INTRAMUSCULAR | Status: AC
  Administered 2015-07-17: 1 mg via INTRAVENOUS

## 2015-07-17 MED ORDER — LORAZEPAM 2 MG PO TABS: 2.0000 mg | ORAL_TABLET | ORAL | Status: AC | PRN

## 2015-07-17 MED ORDER — LORAZEPAM 2 MG/ML IJ SOLN: 1.0000 mg | INTRAMUSCULAR | Status: DC | PRN

## 2015-07-17 MED ORDER — LEVETIRACETAM IN NACL 1000 MG/100ML IV SOLN
INTRAVENOUS | Status: AC
  Administered 2015-07-17: 1000 mg via INTRAVENOUS
  Filled 2015-07-17: qty 100

## 2015-07-17 MED ORDER — LORAZEPAM 2 MG/ML IJ SOLN
INTRAMUSCULAR | Status: AC
  Administered 2015-07-17: 1 mg via INTRAVENOUS
  Filled 2015-07-17: qty 1

## 2015-07-17 MED ORDER — LEVETIRACETAM IN NACL 1000 MG/100ML IV SOLN
1000.0000 mg | Freq: Once | INTRAVENOUS | Status: AC
  Administered 2015-07-17: 1000 mg via INTRAVENOUS

## 2015-07-17 MED ORDER — DIAZEPAM 2.5 MG RE GEL
RECTAL | Status: AC
  Administered 2015-07-17: 2.5 mg via RECTAL
  Filled 2015-07-17: qty 2.5

## 2015-07-17 MED ORDER — LORAZEPAM 1 MG PO TABS
2.0000 mg | ORAL_TABLET | ORAL | Status: DC | PRN
  Administered 2015-07-17 (×2): 2 mg via SUBLINGUAL
  Filled 2015-07-17 (×3): qty 2

## 2015-07-17 MED ORDER — SODIUM CHLORIDE 0.9 % IV BOLUS (SEPSIS)
1000.0000 mL | Freq: Once | INTRAVENOUS | Status: AC
  Administered 2015-07-17: 1000 mL via INTRAVENOUS

## 2015-07-17 NOTE — ED Notes (Addendum)
Pt shaking in arms and legs.  Dr. Dayna Barker notified.  Orders received.

## 2015-07-17 NOTE — ED Notes (Signed)
Dr. Dayna Barker at bedside.  Pt continues to shake.   Follow commands.  Opens eyes when spoken to.

## 2015-07-17 NOTE — ED Notes (Signed)
Palliative care in room talking with family.  Pt having no seizure activity and remains unresponsive at this time.

## 2015-07-17 NOTE — ED Notes (Signed)
Brother in Sports coach at bedside.  States sister will be here in 30 minutes.  No seizure activity at this time.  Pt unresponsive.

## 2015-07-17 NOTE — Clinical Social Work Note (Signed)
CSW facilitated patient's admission and transport to [place] at family's request.     Ihor Gully, LCSW [phone]

## 2015-07-17 NOTE — ED Notes (Signed)
Increased oxygen nasal cannula to 4 liters .

## 2015-07-17 NOTE — ED Notes (Signed)
EMS to transport pt to hospice house.

## 2015-07-17 NOTE — ED Notes (Signed)
Pt resting with eyes shut.  No seizure activity.

## 2015-07-17 NOTE — Progress Notes (Signed)
Daily Progress Note   Patient Name: Jesus [name]       Date: 07/17/2015 DOB: 21-May-1951  Age: 65 y.o. MRN#: [id number] Attending Physician: Merrily Pew, MD Primary Care Physician: Wende Neighbors, MD Admit Date: 07/17/2015  Reason for Consultation/Follow-up: Disposition, Establishing goals of care, Inpatient hospice referral, Psychosocial/spiritual support and Terminal Care  Subjective: Mr. Jesus [name] return to the ED from [facility] d/t seizure like activity.  He has been made comfortable with medications.  His family arrives (scheduled to meet with oncology today for hospice recommendations) and they choose to focus on comfort and dignity for Mr. Jesus [name] and elect to have him sent to [facility].  His son [name], sister [name], former wife [name] and her sister [name] are at bedside. All family is in agreement that this is the best option for Mr. Jesus [name].   Length of Stay: none  Current Medications: Scheduled Meds:     Continuous Infusions:    PRN Meds: LORazepam  Physical Exam: Physical Exam  Constitutional: No distress.  HENT:  Head: Normocephalic and atraumatic.  Cardiovascular: Normal rate.   Pulmonary/Chest: Effort normal and breath sounds normal.  Abdominal: Soft. There is no guarding.  Neurological:  lethargic  Nursing note and vitals reviewed.               Vital Signs: BP 142/111 mmHg  Pulse 104  Temp(Src) 100.4 F (38 C) (Rectal)  Resp 22  SpO2 94% SpO2: SpO2: 94 % O2 Device: O2 Device: Not Delivered O2 Flow Rate:    Intake/output summary: No intake or output data in the 24 hours ending 07/17/15 1417 LBM:   Baseline Weight:   Most recent weight:         Palliative Assessment/Data: Flowsheet Rows        Most Recent Value   Intake Tab    Referral  Department  Hospitalist [ED]   Unit at Time of Referral  ER   Palliative Care Primary Diagnosis  Cancer   Date Notified  07/17/15   Palliative Care Type  Return patient Palliative Care   Reason for referral  Clarify Goals of Care, Non-pain Symptom   Date of Admission  07/17/15   Date first seen by Palliative Care  07/17/15   # of days Palliative referral response time  0 Day(s)   #  of days IP prior to Palliative referral  0   Clinical Assessment    Psychosocial & Spiritual Assessment    Palliative Care Outcomes       Additional Data Reviewed: CBC    Component Value Date/Time   WBC 15.8* 07/17/2015 1045   RBC 4.85 07/17/2015 1045   HGB 14.0 07/17/2015 1045   HCT 41.7 07/17/2015 1045   PLT 103* 07/17/2015 1045   MCV 86.0 07/17/2015 1045   MCH 28.9 07/17/2015 1045   MCHC 33.6 07/17/2015 1045   RDW 15.1 07/17/2015 1045   LYMPHSABS 1.4 07/17/2015 1045   MONOABS 0.8 07/17/2015 1045   EOSABS 0.0 07/17/2015 1045   BASOSABS 0.0 07/17/2015 1045    CMP     Component Value Date/Time   NA 140 07/17/2015 1045   K 4.3 07/17/2015 1045   CL 102 07/17/2015 1045   CO2 26 07/17/2015 1045   GLUCOSE 104* 07/17/2015 1045   BUN 50* 07/17/2015 1045   CREATININE 2.11* 07/17/2015 1045   CALCIUM 8.3* 07/17/2015 1045   PROT 5.4* 07/17/2015 1045   ALBUMIN 2.9* 07/17/2015 1045   AST 246* 07/17/2015 1045   ALT 341* 07/17/2015 1045   ALKPHOS 384* 07/17/2015 1045   BILITOT 5.1* 07/17/2015 1045   GFRNONAA 32* 07/17/2015 1045   GFRAA 37* 07/17/2015 1045       Problem List:  Patient Active Problem List   Diagnosis Date Noted  . HTN (hypertension) 07/15/2015  . Tremors of nervous system 07/15/2015  . PICC (peripherally inserted central catheter) in place   . Palliative care encounter   . DNR (do not resuscitate) discussion   . Transient amnesia   . 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 Bizarre behavior 07/01/2015  . Altered mental status   . CKD (chronic kidney disease), stage III  06/30/2015  . Depression with anxiety 06/30/2015  . Acute encephalopathy 06/29/2015  . Iron deficiency anemia due to chronic blood loss 08/02/2014  . Metastatic urothelial carcinoma ([place]) 07/16/2014     Palliative Care Assessment & Plan    1.Code Status:  DNR    Code Status Orders        Start     Ordered   07/17/15 1044  Do not attempt resuscitation/DNR   Continuous    Question Answer Comment  In the event of cardiac or respiratory ARREST Do not call a "code blue"   In the event of cardiac or respiratory ARREST Do not perform Intubation, CPR, defibrillation or ACLS   In the event of cardiac or respiratory ARREST Use medication by any route, position, wound care, and other measures to relive pain and suffering. May use oxygen, suction and manual treatment of airway obstruction as needed for comfort.      07/17/15 1043    Code Status History    Date Active Date Inactive Code Status Order ID Comments User Context   07/17/2015  7:52 AM 07/17/2015 10:43 AM DNR [id number]  Baird Cancer, PA-C Outpatient   07/07/2015  1:39 PM 07/10/2015  6:12 PM DNR [id number]  Drue Novel, NP Inpatient   07/05/2015  1:34 AM 07/07/2015  1:39 PM Full Code [id number]  Oswald Hillock, MD Inpatient   06/29/2015  5:12 PM 07/02/2015  8:00 PM Full Code [id number]  Doree Albee, MD Inpatient   06/19/2015 12:32 PM 06/20/2015  4:19 PM Full Code [id number]  Irine Seal, MD Inpatient   08/21/2014  1:56 PM 08/22/2014  3:34 AM Full Code [id number]  [place]ath  Laurence Ferrari, [place]   07/16/2014  6:15 PM 07/19/2014  1:19 PM Full Code [id number]  Malka So, MD Inpatient    Advance Directive Documentation        Most Recent Value   Type of Advance Directive  Out of facility DNR (pink MOST or yellow form)   Pre-existing out of facility DNR order (yellow form or pink MOST form)     "MOST" Form in Place?         2. Goals of Care/Additional Recommendations:  Comfort measures only   Limitations on Scope of Treatment: Full Comfort  Care  Desire for further Chaplaincy support:yes  Psycho-social Needs: None at this time.   3. Symptom Management:      1.Ativan 2 mg SL Q 1 hours PRN   4. Palliative Prophylaxis:   Aspiration and Frequent Pain Assessment  5. Prognosis: < 2 weeks  6. Discharge Planning:  Hospice facility   Care plan was discussed with nursing staff, CM, SW, Oncology PA, and ED MD.   Thank you for allowing the Palliative Medicine Team to assist in the care of this patient.   Time In: 0830 Time Out: 1000 Total Time 37minutes Prolonged Time Billed  yes         Drue Novel, NP  07/17/2015, 2:17 PM  Please contact Palliative Medicine Team phone at [phone] for questions and concerns.

## 2015-07-17 NOTE — ED Notes (Signed)
Pt continues to shake in arms and legs.  Orders received from Dr. Dayna Barker.

## 2015-07-17 NOTE — ED Notes (Signed)
Per staff at [facility], pt was found seizing at [phone] and has been continually seizing since then.  Reports pt's eyes have been rolled back in his head, unresponsive,  and full body shaking.  Reports no history of seizures.  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    Staff reports pt's bp 150/100, HR 140, 02sat 94% and cbg 121.  Dr. Dayna Barker aware of pt coming to ER and staff is calling pt's son to come to ER.

## 2015-07-17 NOTE — ED Notes (Signed)
Report give to [place]ne [place] at hospice house.

## 2015-07-17 NOTE — ED Notes (Signed)
Dr. Dayna Barker in talking with family.  Pt remains unresponsive.  Palliative care consulted.

## 2015-07-17 NOTE — ED Notes (Signed)
Son states pt is starting to have seizure activity again.  More ativan being given.

## 2015-07-17 NOTE — ED Provider Notes (Signed)
CSN: [id number]     Arrival date & time 07/17/15  0745 History   First MD Initiated Contact with Patient 07/17/15 [phone]     Chief Complaint  Patient presents with  . Seizures     (Consider location/radiation/quality/duration/timing/severity/associated sxs/prior Treatment) Patient is a 65 y.o. male presenting with seizures.  Seizures Seizure activity on arrival: yes   Seizure type:  Grand mal Initial focality:  Unable to specify Return to baseline: no   Severity:  Severe Duration: unable to specify, at least 45 minutes. Timing:  Unable to specify   Past Medical History  Diagnosis Date  . High cholesterol   . Depression     hx of   . Arthritis   . Bladder cancer ([place])   . Metastatic urothelial carcinoma ([place]) 07/16/2014   Past Surgical History  Procedure Laterality Date  . Knee surgery    . Tonsillectomy    . Transurethral resection of bladder tumor with gyrus (turbt-gyrus) N/A 07/16/2014    Procedure: TRANSURETHRAL RESECTION OF BLADDER TUMOR WITH GYRUS (TURBT-GYRUS);  Surgeon: Malka So, MD;  Location: [facility];  Service: Urology;  Laterality: N/A;  . Cystoscopy with biopsy N/A 07/16/2014    Procedure: CYSTOSCOPY WITH PROSTATE ULTRASOUND AND BIOPSY;  Surgeon: Malka So, MD;  Location: [facility];  Service: Urology;  Laterality: N/A;  . Transurethral resection of bladder tumor N/A 06/19/2015    Procedure: TRANSURETHRAL RESECTION OF BLADDER TUMOR (TURBT);  Surgeon: Irine Seal, MD;  Location: [facility];  Service: Urology;  Laterality: N/A;   Family History  Problem Relation Age of Onset  . Cancer Mother   . Stroke Father   . Cancer Sister    Social History  Substance Use Topics  . Smoking status: Never Smoker   . Smokeless tobacco: Never Used  . Alcohol Use: No    Review of Systems  Unable to perform ROS: Patient unresponsive  Neurological: Positive for seizures.      Allergies  Review of patient's allergies indicates no known allergies.  Home Medications    Prior to Admission medications   Medication Sig Start Date End Date Taking? Authorizing Provider  ondansetron (ZOFRAN) 8 MG tablet Take 1 tablet (8 mg total) by mouth every 8 (eight) hours as needed for nausea or vomiting. Reported on 06/24/2015 07/02/15  Yes Estela Leonie Green, MD  LORazepam (ATIVAN) 2 MG tablet Place 1 tablet (2 mg total) under the tongue every hour as needed for seizure. 07/17/15   Merrily Pew, MD  methylPREDNISolone sodium succinate 140 mg in sodium chloride 0.9 % 50 mL Inject 140 mg into the vein daily. Last dose 2/1. Patient not taking: Reported on 07/17/2015 07/10/15   Samuella Cota, MD   BP 104/68 mmHg  Pulse 97  Temp(Src) 100.4 F (38 C) (Rectal)  Resp 37  SpO2 95% Physical Exam  Constitutional: He appears well-developed and well-nourished. He appears distressed.  Cardiovascular: Regular rhythm.  Tachycardia present.   Pulmonary/Chest: No accessory muscle usage. Tachypnea noted. No respiratory distress. He has rales (mild bibasilar).  Abdominal: Soft. There is no tenderness. There is no rebound.  Neurological: He is unresponsive. He exhibits abnormal muscle tone. He displays seizure activity.  Skin: Skin is warm. He is diaphoretic.  Nursing note and vitals reviewed.   ED Course  Procedures (including critical care time)  CRITICAL CARE Performed by: Merrily Pew   Total critical care time: 45 minutes  Critical care time was exclusive of separately billable procedures and treating other  patients.  Critical care was necessary to treat or prevent imminent or life-threatening deterioration.  Critical care was time spent personally by me on the following activities: development of treatment plan with patient and/or surrogate as well as nursing, discussions with consultants, evaluation of patient's response to treatment, examination of patient, obtaining history from patient or surrogate, ordering and performing treatments and interventions, ordering  and review of laboratory studies, ordering and review of radiographic studies, pulse oximetry and re-evaluation of patient's condition.   Labs Review Labs Reviewed  CBC WITH DIFFERENTIAL/PLATELET - Abnormal; Notable for the following:    WBC 15.8 (*)    Platelets 103 (*)    Neutro Abs 13.6 (*)    All other components within normal limits  COMPREHENSIVE METABOLIC PANEL - Abnormal; Notable for the following:    Glucose, Bld 104 (*)    BUN 50 (*)    Creatinine, Ser 2.11 (*)    Calcium 8.3 (*)    Total Protein 5.4 (*)    Albumin 2.9 (*)    AST 246 (*)    ALT 341 (*)    Alkaline Phosphatase 384 (*)    Total Bilirubin 5.1 (*)    GFR calc non Af Amer 32 (*)    GFR calc Af Amer 37 (*)    All other components within normal limits    Imaging Review Ct Head Wo Contrast  07/17/2015  CLINICAL DATA:  Seizures. Patient receiving chemotherapy for metastatic urinary bladder carcinoma EXAM: CT HEAD WITHOUT CONTRAST TECHNIQUE: Contiguous axial images were obtained from the base of the skull through the vertex without intravenous contrast. COMPARISON:  Head CT June 29, 2015 and brain MRI June 30, 2015 FINDINGS: Moderate diffuse atrophy is stable. There is no intracranial mass, hemorrhage, extra-axial fluid collection, or midline shift. Gray-white compartments appear within normal limits. No acute infarct evident. Bony calvarium appears intact. Mastoid air cells are clear. Visualized orbits appear symmetric and unremarkable bilaterally. IMPRESSION: Moderate diffuse atrophy. No intracranial mass, hemorrhage, or edema. No evidence suggesting acute infarct. Electronically Signed   By: Lowella Grip III M.D.   On: 07/17/2015 11:13   I have personally reviewed and evaluated these images and lab results as part of my medical decision-making.   EKG Interpretation None      MDM   Final diagnoses:  Seizure ([place])   Unknown seizure duration, febrile, hypertensive and tachycardic on arrival here.  Possibly metastatic, infectious or metabolic causes for seizure, however he Has DNR/DNI in place, spoke with son who prefers we do our best to decrease seizure activity but recognizing the limitations with likely status epilepticus and not having the ability to intubate if needed for airway control. He is on his way to discuss hospice/palliative care.  Patient with multiple tremors and also separate seizure like episodes while in the ED, always responsive to ativan. D/W family, oncologist, palliative care, social work and plan to continue PRN ativan but utmost priority was comfort so will dc to hospice for further management without any obvious easily reversible causes of his seizures here.   Merrily Pew, MD 07/17/15 [phone]

## 2015-07-17 NOTE — ED Notes (Signed)
Pt has eyes open and moving all extremities.  Follow commands.  Makes eye contact when spoken to.  Hot to touch.  Recheck rectal temp 100.4.

## 2015-07-17 NOTE — ED Notes (Signed)
Pt arrouses some when spoken to. Resting with eyes shut.

## 2015-07-17 NOTE — Progress Notes (Signed)
-  Patient in ED, transitioned to Hospice-

## 2015-07-17 NOTE — Assessment & Plan Note (Deleted)
Relapsed Stage IV Urothelial carcinoma after receiving 6 cycles of first-line Carboplatin/Gemcitabine finishing on 10/10/2014 having received a complete metabolic remission on PET imagaing in May 2016.  Relapse of disease noted in December 2016 resulting in movement towards second line therapy.  He received his first cycle of immunotherapy (Atezolizumab) on 06/24/2015 with discontinuation of treatment shortly thereafter due to mental status change that may have been starting prior to his first treatment (please see previous oncology notes).  Oncology history updated.  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  Mental status change, in hindsight, may have started prior to treatment as he was late for his treatment on 06/24/2015 which is not typical for [place].  Additionally, some of his comments to nursing during treatment were a little more odd than usual.  NOW, patient is in the ED with seizure-like activity, hypertension, fevers.  [name] called me at 0906 hrs to let me know he was en route to ED at [facility] from [place].  ED note reviewed.  He was started on high dose IV methylprednisolone 140 mg without any improvement in his mental status.  Actually, his status has declined and this is confirmed by [facility] Nursing staff, patient's family member, and his son, [name] who has remained in close contact with [place].  Order for transition of IV steroids to PO Prednisone with taper given to nursing at [facility] on 07/16/2015.    Will refer the patient to Hospice.  Return PRN.

## 2015-07-17 NOTE — ED Notes (Signed)
Resting quietly .  No distress.  No seizure activity.

## 2015-07-21 ENCOUNTER — Encounter: Payer: Self-pay | Admitting: Internal Medicine

## 2015-07-23 ENCOUNTER — Ambulatory Visit (HOSPITAL_COMMUNITY): Payer: Self-pay | Admitting: Hematology & Oncology

## 2015-07-23 DEATH — deceased

## 2015-07-28 ENCOUNTER — Encounter (HOSPITAL_COMMUNITY): Payer: Commercial Managed Care - HMO

## 2015-08-05 ENCOUNTER — Ambulatory Visit (HOSPITAL_COMMUNITY): Payer: Commercial Managed Care - HMO | Admitting: Hematology & Oncology

## 2015-08-05 ENCOUNTER — Inpatient Hospital Stay (HOSPITAL_COMMUNITY): Payer: Commercial Managed Care - HMO

## 2015-08-19 NOTE — Progress Notes (Signed)
This encounter was created in error - please disregard.

## 2015-08-26 ENCOUNTER — Inpatient Hospital Stay (HOSPITAL_COMMUNITY): Payer: Commercial Managed Care - HMO

## 2015-08-26 ENCOUNTER — Ambulatory Visit (HOSPITAL_COMMUNITY): Payer: Commercial Managed Care - HMO | Admitting: Hematology & Oncology

## 2015-09-16 ENCOUNTER — Ambulatory Visit (HOSPITAL_COMMUNITY): Payer: Commercial Managed Care - HMO | Admitting: Hematology & Oncology

## 2015-09-16 ENCOUNTER — Inpatient Hospital Stay (HOSPITAL_COMMUNITY): Payer: Commercial Managed Care - HMO

## 2015-09-22 ENCOUNTER — Encounter (HOSPITAL_COMMUNITY): Payer: Commercial Managed Care - HMO

## 2017-07-06 IMAGING — CT NM PET TUM IMG RESTAG (PS) SKULL BASE T - THIGH
7 series · 22 of 25 positions shown · non-contrast
Comparison: PET-CT 02/20/2015.

CLINICAL DATA: Subsequent treatment strategy for stage IV bladder
carcinoma. Restaging examination.

EXAM:
NUCLEAR MEDICINE PET SKULL BASE TO THIGH
TECHNIQUE: 8.9 mCi F-18 FDG was injected intravenously. Full-ring PET imaging
was performed from the skull base to thigh after the radiotracer. CT
data was obtained and used for attenuation correction and anatomic
localization.
FASTING BLOOD GLUCOSE:  Value: 125 mg/dl

[Series 4: ct sk_thigh 5.0 b31f · axial · 5.0mm · 0.93mm/px · z∈[-1242,-326]mm · 5 of 228 slices shown]
[im 1/228]
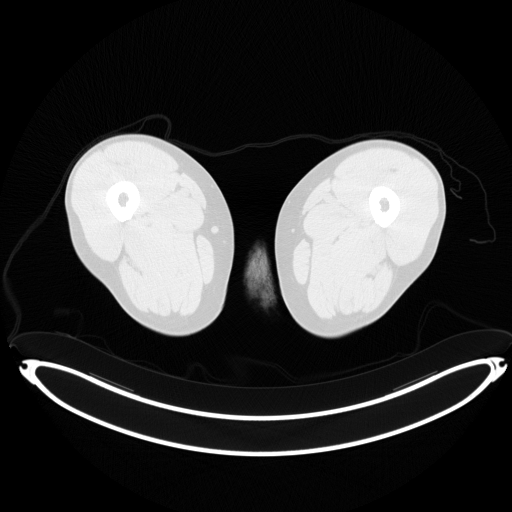
[im 46/228]
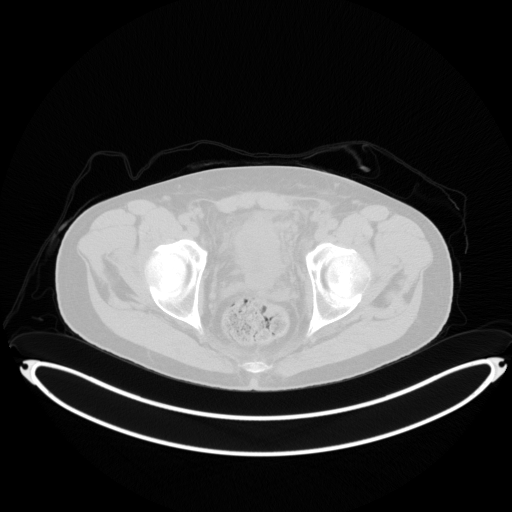
[im 91/228]
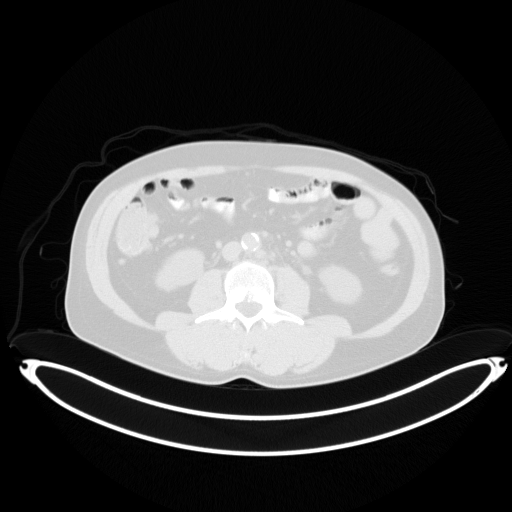
[im 137/228]
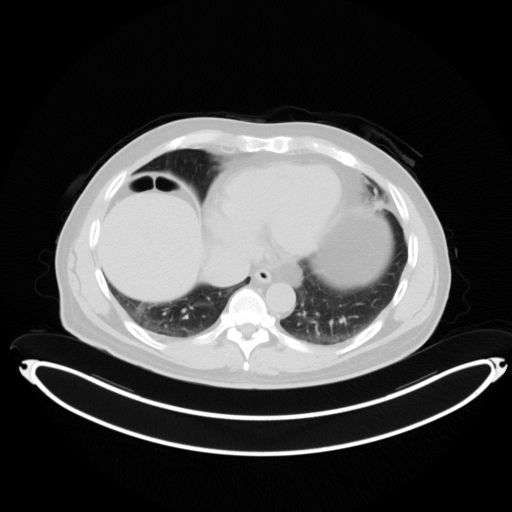
[im 228/228  brain]
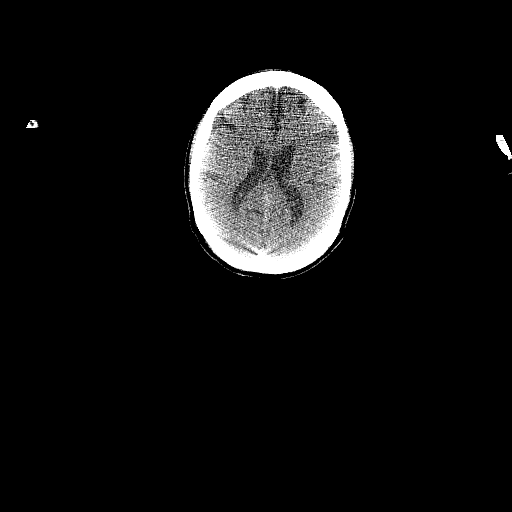

[Series 7: pet sk_thigh nac · axial · 5.0mm · 4.07mm/px · z∈[-1242,-482]mm · 6 of 230 slices shown]
[im 1/230]
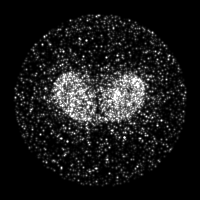
[im 39/230]
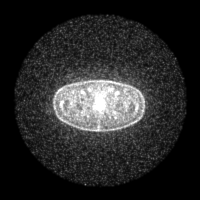
[im 77/230]
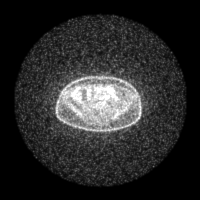
[im 115/230]
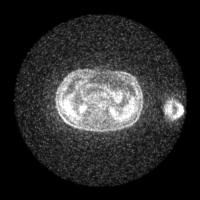
[im 153/230]
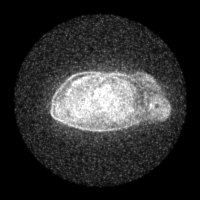
[im 191/230]
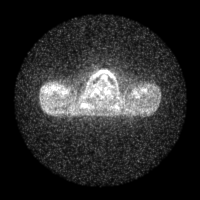

[Series 8: ct sk_thigh 5.0 b70f lung_bone · axial · 5.0mm · 0.71mm/px · z∈[-780,-500]mm · 2 of 71 slices shown]
[im 1/71  bone]
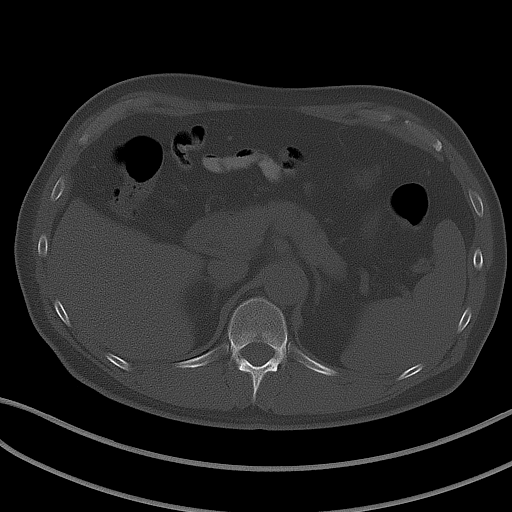
[im 71/71  bone]
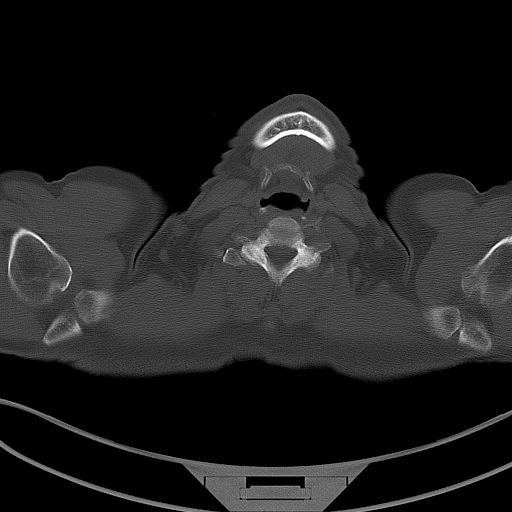

[Series 604: mip collection<mip range> · coronal · 1.90mm/px · 1 of 32 slices shown]
[im 1/32]
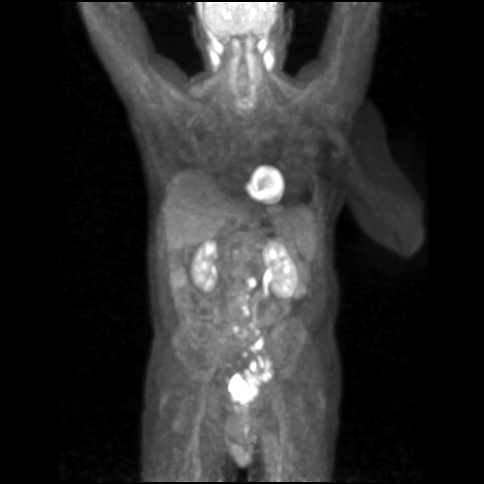

[Series 605: range-ct sk_thigh 5.0 (id)<alpha range> · 2 of 79 slices shown (1 of 2)]
[im 1/79]
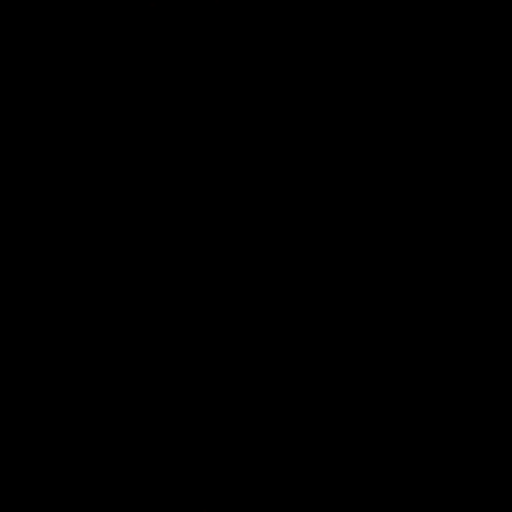
[im 79/79]
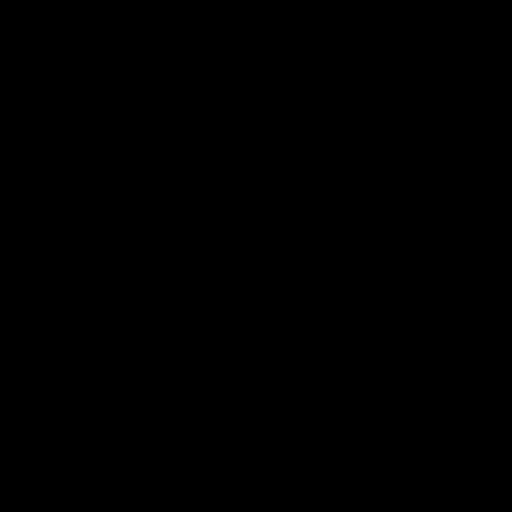

[Series 606: range-ct sk_thigh 5.0 (id)<alpha range> · 5 of 215 slices shown (2 of 2)]
[im 1/215]
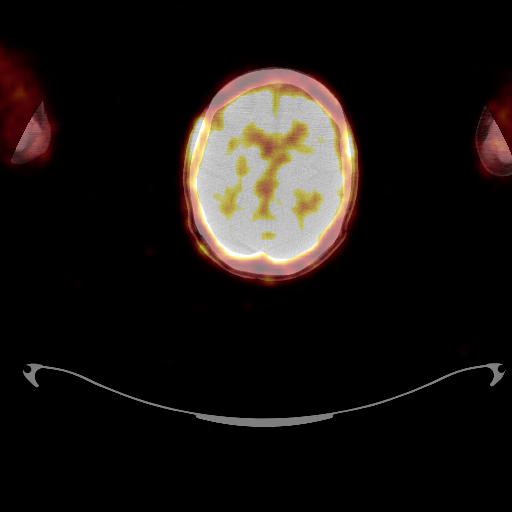
[im 43/215]
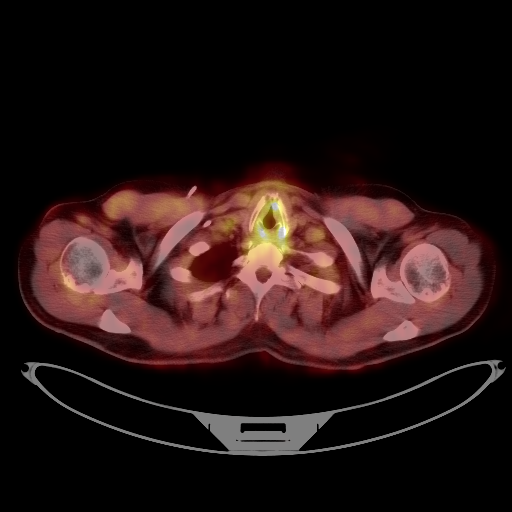
[im 129/215]
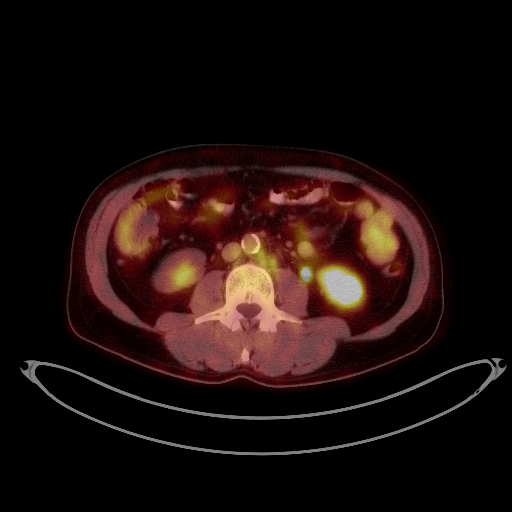
[im 172/215]
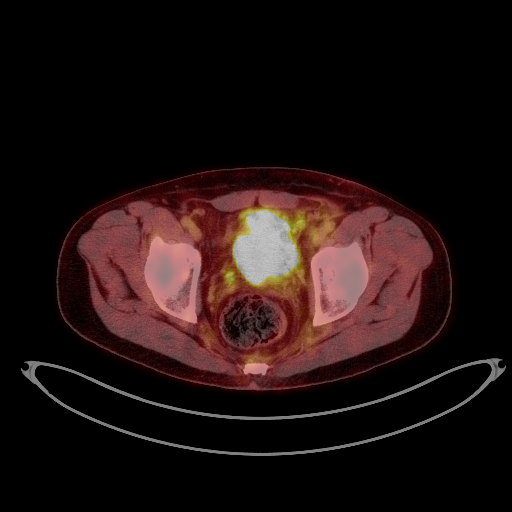
[im 215/215]
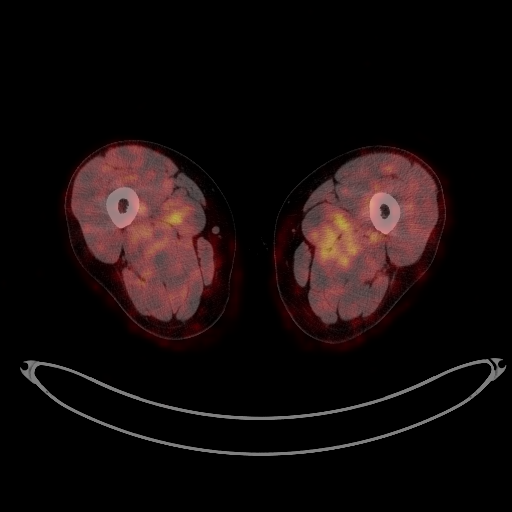

[Series 1221: results mm oncology reading · 1.07mm/px · 1 of 14 slices shown]
[im 1/14]
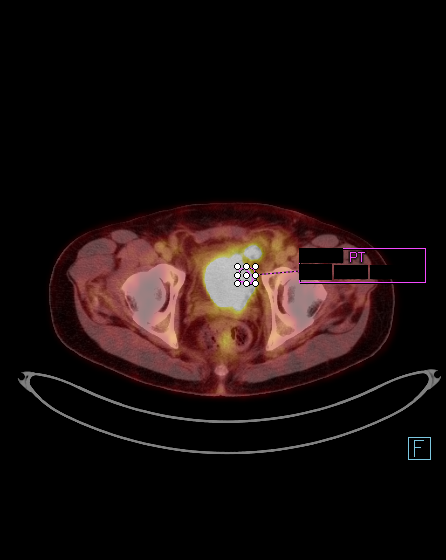

[22 of 25 positions shown; findings below may reference images not displayed]

FINDINGS: NECK

No hypermetabolic lymph nodes in the neck.

CHEST

No hypermetabolic mediastinal or hilar nodes. No suspicious
pulmonary nodules on the CT scan. Heart size is normal. No
consolidative airspace disease. No pleural effusions. Right internal
jugular single-lumen porta cath with tip terminating at the superior
cavoatrial junction.

ABDOMEN/PELVIS

Compared a prior examinations there is new mild left
hydroureteronephrosis which extends all the way to the level of the
left ureterovesicular junction. This is apparently related to
obstruction from soft tissue at the level of the urinary bladder.
Specifically, the urinary bladder wall is now markedly thickened and
irregular, most evident along the left lateral surface, where the
largest mass-like area measures up to 5.8 x 3.5 cm (image 188 of
series 4), with direct infiltration into the adjacent perivesical
fat. This part of the urinary bladder wall is hypermetabolic (SUVmax
= 15.8). There is extensive adjacent nodularity throughout the
perivesicle space, presumably multiple metastatic lymph nodes, all
of which are hypermetabolic. Extensive adenopathy throughout the
left hemipelvis, with the largest lymph nodes measuring up to 12 mm
in short axis in the left external iliac nodal chain (SUVmax =
11.9). Left internal iliac adenopathy also noted measuring up to 14
mm in short axis all (image 165 of series 4), which is also
hypermetabolic (SUVmax = 16.8). Numerous other enlarged and
hypermetabolic retroperitoneal lymph nodes are noted, measuring up
to 14 mm in short axis in the left para-aortic nodal station in the
immediate infrahilar region (SUVmax = 16.3). No abnormal
hypermetabolic activity within the liver, pancreas, adrenal glands,
or spleen. Atherosclerosis throughout the abdominal and pelvic
vasculature, without definite aneurysm. No significant volume of
ascites. No pneumoperitoneum.

SKELETON

No focal hypermetabolic activity to suggest skeletal metastasis.
IMPRESSION: 1. Today's study demonstrates definitive evidence of recurrence of
disease, with a large left-sided bladder wall mass, infiltrated into
the surrounding soft tissues, with extensive surrounding
lymphadenopathy throughout the soft tissues of the pelvic floor,
along the left pelvic sidewall and throughout the retroperitoneum on
the left side extending to the level of the left renal hilum. At
this time, this is associated with some mild left-sided
hydroureteronephrosis from obstruction at the level of the left
ureterovesicular junction.
2. No evidence of metastatic disease in the neck or thorax.
3. Additional incidental findings, as above.
These results will be called to the ordering clinician or
representative by the Radiologist Assistant, and communication
documented in the PACS or zVision Dashboard.
